# Patient Record
Sex: Female | Born: 1935 | Race: White | Hispanic: No | Smoking: Never smoker
Health system: Southern US, Community
[De-identification: ages and names within clinical notes are randomized; demographics above are authoritative.]

## PROBLEM LIST (undated history)

## (undated) DIAGNOSIS — I10 Essential (primary) hypertension: Secondary | ICD-10-CM

## (undated) DIAGNOSIS — M542 Cervicalgia: Secondary | ICD-10-CM

## (undated) DIAGNOSIS — G609 Hereditary and idiopathic neuropathy, unspecified: Secondary | ICD-10-CM

## (undated) DIAGNOSIS — S7000XA Contusion of unspecified hip, initial encounter: Secondary | ICD-10-CM

## (undated) DIAGNOSIS — G4700 Insomnia, unspecified: Secondary | ICD-10-CM

## (undated) DIAGNOSIS — S81809A Unspecified open wound, unspecified lower leg, initial encounter: Secondary | ICD-10-CM

## (undated) DIAGNOSIS — IMO0002 Reserved for concepts with insufficient information to code with codable children: Secondary | ICD-10-CM

## (undated) DIAGNOSIS — E039 Hypothyroidism, unspecified: Secondary | ICD-10-CM

## (undated) DIAGNOSIS — J189 Pneumonia, unspecified organism: Secondary | ICD-10-CM

## (undated) DIAGNOSIS — C44701 Unspecified malignant neoplasm of skin of unspecified lower limb, including hip: Secondary | ICD-10-CM

## (undated) DIAGNOSIS — M461 Sacroiliitis, not elsewhere classified: Secondary | ICD-10-CM

## (undated) DIAGNOSIS — K3189 Other diseases of stomach and duodenum: Secondary | ICD-10-CM

## (undated) DIAGNOSIS — J218 Acute bronchiolitis due to other specified organisms: Secondary | ICD-10-CM

## (undated) DIAGNOSIS — S91009A Unspecified open wound, unspecified ankle, initial encounter: Secondary | ICD-10-CM

## (undated) DIAGNOSIS — R1013 Epigastric pain: Secondary | ICD-10-CM

## (undated) DIAGNOSIS — K922 Gastrointestinal hemorrhage, unspecified: Secondary | ICD-10-CM

## (undated) DIAGNOSIS — E785 Hyperlipidemia, unspecified: Secondary | ICD-10-CM

## (undated) DIAGNOSIS — I471 Supraventricular tachycardia, unspecified: Secondary | ICD-10-CM

## (undated) DIAGNOSIS — S20212A Contusion of left front wall of thorax, initial encounter: Secondary | ICD-10-CM

## (undated) DIAGNOSIS — S72109A Unspecified trochanteric fracture of unspecified femur, initial encounter for closed fracture: Secondary | ICD-10-CM

## (undated) DIAGNOSIS — R209 Unspecified disturbances of skin sensation: Secondary | ICD-10-CM

## (undated) DIAGNOSIS — S81009A Unspecified open wound, unspecified knee, initial encounter: Secondary | ICD-10-CM

## (undated) DIAGNOSIS — K208 Other esophagitis without bleeding: Secondary | ICD-10-CM

## (undated) DIAGNOSIS — R002 Palpitations: Secondary | ICD-10-CM

## (undated) DIAGNOSIS — R748 Abnormal levels of other serum enzymes: Secondary | ICD-10-CM

## (undated) DIAGNOSIS — M8448XA Pathological fracture, other site, initial encounter for fracture: Secondary | ICD-10-CM

## (undated) DIAGNOSIS — M1990 Unspecified osteoarthritis, unspecified site: Secondary | ICD-10-CM

## (undated) DIAGNOSIS — M940 Chondrocostal junction syndrome [Tietze]: Secondary | ICD-10-CM

## (undated) DIAGNOSIS — M25519 Pain in unspecified shoulder: Secondary | ICD-10-CM

## (undated) DIAGNOSIS — M25569 Pain in unspecified knee: Secondary | ICD-10-CM

## (undated) DIAGNOSIS — M545 Low back pain, unspecified: Secondary | ICD-10-CM

## (undated) DIAGNOSIS — W010XXA Fall on same level from slipping, tripping and stumbling without subsequent striking against object, initial encounter: Secondary | ICD-10-CM

## (undated) DIAGNOSIS — R7989 Other specified abnormal findings of blood chemistry: Secondary | ICD-10-CM

## (undated) DIAGNOSIS — R609 Edema, unspecified: Secondary | ICD-10-CM

## (undated) DIAGNOSIS — I2510 Atherosclerotic heart disease of native coronary artery without angina pectoris: Secondary | ICD-10-CM

## (undated) DIAGNOSIS — S022XXA Fracture of nasal bones, initial encounter for closed fracture: Secondary | ICD-10-CM

## (undated) DIAGNOSIS — K219 Gastro-esophageal reflux disease without esophagitis: Secondary | ICD-10-CM

## (undated) DIAGNOSIS — C44711 Basal cell carcinoma of skin of unspecified lower limb, including hip: Secondary | ICD-10-CM

## (undated) DIAGNOSIS — H409 Unspecified glaucoma: Secondary | ICD-10-CM

## (undated) DIAGNOSIS — I4949 Other premature depolarization: Secondary | ICD-10-CM

## (undated) DIAGNOSIS — Z9181 History of falling: Secondary | ICD-10-CM

## (undated) DIAGNOSIS — M25579 Pain in unspecified ankle and joints of unspecified foot: Secondary | ICD-10-CM

## (undated) DIAGNOSIS — C44519 Basal cell carcinoma of skin of other part of trunk: Secondary | ICD-10-CM

## (undated) DIAGNOSIS — M6740 Ganglion, unspecified site: Secondary | ICD-10-CM

## (undated) HISTORY — DX: Abnormal levels of other serum enzymes: R74.8

## (undated) HISTORY — DX: Unspecified malignant neoplasm of skin of unspecified lower limb, including hip: C44.701

## (undated) HISTORY — DX: Basal cell carcinoma of skin of other part of trunk: C44.519

## (undated) HISTORY — DX: Contusion of left front wall of thorax, initial encounter: S20.212A

## (undated) HISTORY — DX: Ganglion, unspecified site: M67.40

## (undated) HISTORY — PX: JOINT REPLACEMENT: SHX530

## (undated) HISTORY — DX: Hypothyroidism, unspecified: E03.9

## (undated) HISTORY — DX: Pain in unspecified shoulder: M25.519

## (undated) HISTORY — DX: Insomnia, unspecified: G47.00

## (undated) HISTORY — DX: Basal cell carcinoma of skin of unspecified lower limb, including hip: C44.711

## (undated) HISTORY — DX: Unspecified glaucoma: H40.9

## (undated) HISTORY — DX: Pain in unspecified ankle and joints of unspecified foot: M25.579

## (undated) HISTORY — DX: Pathological fracture, other site, initial encounter for fracture: M84.48XA

## (undated) HISTORY — DX: Contusion of unspecified hip, initial encounter: S70.00XA

## (undated) HISTORY — DX: Reserved for concepts with insufficient information to code with codable children: IMO0002

## (undated) HISTORY — DX: Supraventricular tachycardia, unspecified: I47.10

## (undated) HISTORY — DX: Acute bronchiolitis due to other specified organisms: J21.8

## (undated) HISTORY — DX: Other specified abnormal findings of blood chemistry: R79.89

## (undated) HISTORY — DX: Fall on same level from slipping, tripping and stumbling without subsequent striking against object, initial encounter: W01.0XXA

## (undated) HISTORY — DX: Sacroiliitis, not elsewhere classified: M46.1

## (undated) HISTORY — DX: Low back pain: M54.5

## (undated) HISTORY — PX: EYE SURGERY: SHX253

## (undated) HISTORY — DX: Atherosclerotic heart disease of native coronary artery without angina pectoris: I25.10

## (undated) HISTORY — DX: Other esophagitis: K20.8

## (undated) HISTORY — DX: Chondrocostal junction syndrome (tietze): M94.0

## (undated) HISTORY — DX: Other premature depolarization: I49.49

## (undated) HISTORY — DX: Unspecified trochanteric fracture of unspecified femur, initial encounter for closed fracture: S72.109A

## (undated) HISTORY — DX: Edema, unspecified: R60.9

## (undated) HISTORY — DX: Unspecified open wound, unspecified lower leg, initial encounter: S81.809A

## (undated) HISTORY — DX: Palpitations: R00.2

## (undated) HISTORY — DX: Cervicalgia: M54.2

## (undated) HISTORY — DX: Low back pain, unspecified: M54.50

## (undated) HISTORY — DX: Supraventricular tachycardia: I47.1

## (undated) HISTORY — DX: Epigastric pain: R10.13

## (undated) HISTORY — DX: History of falling: Z91.81

## (undated) HISTORY — DX: Other diseases of stomach and duodenum: K31.89

## (undated) HISTORY — DX: Hereditary and idiopathic neuropathy, unspecified: G60.9

## (undated) HISTORY — DX: Fracture of nasal bones, initial encounter for closed fracture: S02.2XXA

## (undated) HISTORY — DX: Pneumonia, unspecified organism: J18.9

## (undated) HISTORY — DX: Gastrointestinal hemorrhage, unspecified: K92.2

## (undated) HISTORY — DX: Gastro-esophageal reflux disease without esophagitis: K21.9

## (undated) HISTORY — DX: Essential (primary) hypertension: I10

## (undated) HISTORY — DX: Unspecified open wound, unspecified ankle, initial encounter: S91.009A

## (undated) HISTORY — DX: Hyperlipidemia, unspecified: E78.5

## (undated) HISTORY — DX: Unspecified open wound, unspecified knee, initial encounter: S81.009A

## (undated) HISTORY — DX: Unspecified disturbances of skin sensation: R20.9

## (undated) HISTORY — DX: Unspecified osteoarthritis, unspecified site: M19.90

## (undated) HISTORY — DX: Other esophagitis without bleeding: K20.80

## (undated) HISTORY — DX: Pain in unspecified knee: M25.569

---

## 1993-12-30 HISTORY — PX: BREAST LUMPECTOMY: SHX2

## 1998-07-06 ENCOUNTER — Other Ambulatory Visit: Admission: RE | Admit: 1998-07-06 | Discharge: 1998-07-06 | Payer: Self-pay | Admitting: Dermatology

## 1998-10-11 ENCOUNTER — Encounter: Payer: Self-pay | Admitting: Orthopedic Surgery

## 1998-10-16 ENCOUNTER — Encounter: Payer: Self-pay | Admitting: Orthopedic Surgery

## 1998-10-16 ENCOUNTER — Inpatient Hospital Stay (HOSPITAL_COMMUNITY): Admission: RE | Admit: 1998-10-16 | Discharge: 1998-10-19 | Payer: Self-pay | Admitting: Orthopedic Surgery

## 1999-12-31 HISTORY — PX: TOTAL HIP ARTHROPLASTY: SHX124

## 2001-12-30 HISTORY — PX: CATARACT EXTRACTION W/ INTRAOCULAR LENS  IMPLANT, BILATERAL: SHX1307

## 2004-07-17 ENCOUNTER — Emergency Department (HOSPITAL_COMMUNITY): Admission: EM | Admit: 2004-07-17 | Discharge: 2004-07-17 | Payer: Self-pay | Admitting: Emergency Medicine

## 2006-01-11 ENCOUNTER — Emergency Department (HOSPITAL_COMMUNITY): Admission: EM | Admit: 2006-01-11 | Discharge: 2006-01-11 | Payer: Self-pay | Admitting: Emergency Medicine

## 2009-04-27 ENCOUNTER — Inpatient Hospital Stay (HOSPITAL_COMMUNITY): Admission: EM | Admit: 2009-04-27 | Discharge: 2009-04-29 | Payer: Self-pay | Admitting: Emergency Medicine

## 2009-04-28 ENCOUNTER — Encounter: Payer: Self-pay | Admitting: Cardiology

## 2009-04-29 ENCOUNTER — Ambulatory Visit: Admission: RE | Admit: 2009-04-29 | Discharge: 2009-04-29 | Payer: Self-pay | Admitting: Cardiology

## 2009-05-04 ENCOUNTER — Inpatient Hospital Stay (HOSPITAL_COMMUNITY): Admission: EM | Admit: 2009-05-04 | Discharge: 2009-05-05 | Payer: Self-pay | Admitting: Emergency Medicine

## 2009-05-30 ENCOUNTER — Ambulatory Visit: Payer: Self-pay | Admitting: Cardiology

## 2009-08-23 ENCOUNTER — Ambulatory Visit: Payer: Self-pay | Admitting: Cardiology

## 2009-08-30 ENCOUNTER — Encounter: Admission: RE | Admit: 2009-08-30 | Discharge: 2009-08-30 | Payer: Self-pay | Admitting: Internal Medicine

## 2009-12-01 ENCOUNTER — Encounter (INDEPENDENT_AMBULATORY_CARE_PROVIDER_SITE_OTHER): Payer: Self-pay | Admitting: *Deleted

## 2009-12-09 ENCOUNTER — Emergency Department (HOSPITAL_COMMUNITY): Admission: EM | Admit: 2009-12-09 | Discharge: 2009-12-09 | Payer: Self-pay | Admitting: Emergency Medicine

## 2009-12-15 ENCOUNTER — Ambulatory Visit: Payer: Self-pay | Admitting: Cardiology

## 2010-01-03 ENCOUNTER — Encounter: Admission: RE | Admit: 2010-01-03 | Discharge: 2010-01-03 | Payer: Self-pay | Admitting: Internal Medicine

## 2010-01-16 ENCOUNTER — Ambulatory Visit: Payer: Self-pay | Admitting: Cardiology

## 2010-02-04 ENCOUNTER — Emergency Department (HOSPITAL_COMMUNITY): Admission: EM | Admit: 2010-02-04 | Discharge: 2010-02-04 | Payer: Self-pay | Admitting: Emergency Medicine

## 2010-03-02 ENCOUNTER — Encounter: Admission: RE | Admit: 2010-03-02 | Discharge: 2010-03-02 | Payer: Self-pay | Admitting: Internal Medicine

## 2010-03-19 ENCOUNTER — Other Ambulatory Visit: Payer: Self-pay | Admitting: Emergency Medicine

## 2010-03-20 ENCOUNTER — Inpatient Hospital Stay (HOSPITAL_COMMUNITY): Admission: EM | Admit: 2010-03-20 | Discharge: 2010-03-23 | Payer: Self-pay | Admitting: Internal Medicine

## 2010-03-27 ENCOUNTER — Encounter (INDEPENDENT_AMBULATORY_CARE_PROVIDER_SITE_OTHER): Payer: Self-pay | Admitting: *Deleted

## 2010-07-16 DIAGNOSIS — I219 Acute myocardial infarction, unspecified: Secondary | ICD-10-CM | POA: Insufficient documentation

## 2010-07-16 DIAGNOSIS — E785 Hyperlipidemia, unspecified: Secondary | ICD-10-CM

## 2010-07-18 ENCOUNTER — Ambulatory Visit: Payer: Self-pay | Admitting: Cardiology

## 2010-07-18 DIAGNOSIS — I2510 Atherosclerotic heart disease of native coronary artery without angina pectoris: Secondary | ICD-10-CM

## 2010-08-22 HISTORY — PX: LEG SKIN LESION  BIOPSY / EXCISION: SUR473

## 2010-11-26 ENCOUNTER — Emergency Department (HOSPITAL_COMMUNITY): Admission: EM | Admit: 2010-11-26 | Discharge: 2010-11-26 | Payer: Self-pay | Admitting: Family Medicine

## 2010-11-27 ENCOUNTER — Encounter: Admission: RE | Admit: 2010-11-27 | Discharge: 2010-11-27 | Payer: Self-pay | Admitting: Internal Medicine

## 2010-11-29 ENCOUNTER — Ambulatory Visit: Payer: Self-pay | Admitting: Cardiology

## 2010-11-29 DIAGNOSIS — I471 Supraventricular tachycardia: Secondary | ICD-10-CM

## 2011-01-31 NOTE — Miscellaneous (Signed)
  Clinical Lists Changes  Observations: Added new observation of RS STUDY: TRACER - study completion 01/16/10 (03/27/2010 12:14)      Research Study Name: TRACER - study completion 01/16/10

## 2011-01-31 NOTE — Assessment & Plan Note (Signed)
Summary: 6 month SVT/CAD  pfh,rn   Visit Type:  Follow-up Primary Provider:  Dr. Carles Collet  CC:  SVT and CAD.  History of Present Illness: The patient returns for followup of the above issues. Since I last saw her she has had no new cardiovascular complaints. She does not report any palpitations, presyncope or syncope. He has had chest pressure, or discomfort. She denies any new shortness of breath, PND or orthopnea. She has had no weight gain or edema. She has been bothered recently by some left foot pain which is somewhat limited her.  Current Medications (verified): 1)  Zetia 10 Mg Tabs (Ezetimibe) .Marland Kitchen.. 1 By Mouth Daily 2)  Protonix 40 Mg Tbec (Pantoprazole Sodium) .Marland Kitchen.. 1 By Mouth Daily 3)  Metoprolol Tartrate 25 Mg Tabs (Metoprolol Tartrate) .... 1/2 By Mouth  Two Times A Day 4)  Multivitamins   Tabs (Multiple Vitamin) .Marland Kitchen.. 1 By Mouth Dialy 5)  Fish Oil   Oil (Fish Oil) .Marland Kitchen.. 1 By Mouth Daily 6)  Acetaminophen 325 Mg  Tabs (Acetaminophen) .... As Needed 7)  Isosorbide Mononitrate Cr 60 Mg Xr24h-Tab (Isosorbide Mononitrate) .Marland Kitchen.. 1 By Mouth Daily 8)  Lotrel 5-10 Mg Caps (Amlodipine Besy-Benazepril Hcl) .Marland Kitchen.. 1 Podialy 9)  Aspirin 81 Mg  Tabs (Aspirin) .Marland Kitchen.. 1 By Mouth Daily 10)  Alphagan P 0.15 % Soln (Brimonidine Tartrate) .... As Directed 11)  Xalatan 0.005 % Soln (Latanoprost) .... As Directed 12)  Lipitor 20 Mg Tabs (Atorvastatin Calcium) .... One Daily  Allergies (verified): No Known Drug Allergies  Past History:  Past Medical History: Reviewed history from 07/18/2010 and no changes required.  1.  SVT.   2. Hypertension.   3. Dyslipidemia.   4. CAD (Cath 03/2009. Distal LAD 100%, Circ distal 60%, OM2 30%)  5. GI bleeding  Past Surgical History: Reviewed history from 07/18/2010 and no changes required. Hip surgery  Review of Systems       As stated in the HPI and negative for all other systems.   Vital Signs:  Patient profile:   76 year old female Height:      66  inches Weight:      161 pounds BMI:     26.08 Pulse rate:   54 / minute Resp:     16 per minute BP sitting:   142 / 78  (right arm)  Vitals Entered By: Marrion Coy, CNA (November 29, 2010 12:14 PM)  Physical Exam  General:  Well developed, well nourished, in no acute distress. Head:  normocephalic and atraumatic Mouth:  Oral mucosa normal. Neck:  Neck supple, no JVD. No masses, thyromegaly or abnormal cervical nodes. Chest Wall:  no deformities or breast masses noted Lungs:  Clear bilaterally to auscultation and percussion. Abdomen:  Bowel sounds positive; abdomen soft and non-tender without masses, organomegaly, or hernias noted. No hepatosplenomegaly. Msk:  Back normal, normal gait. Muscle strength and tone normal. Extremities:  No clubbing or cyanosis. Neurologic:  Alert and oriented x 3. Skin:  Intact without lesions or rashes. Cervical Nodes:  no significant adenopathy Axillary Nodes:  no significant adenopathy Inguinal Nodes:  no significant adenopathy Psych:  Normal affect.   Detailed Cardiovascular Exam  Neck    Carotids: Carotids full and equal bilaterally without bruits.      Neck Veins: Normal, no JVD.    Heart    Inspection: no deformities or lifts noted.      Palpation: normal PMI with no thrills palpable.  Auscultation: regular rate and rhythm, S1, S2 without murmurs, rubs, gallops, or clicks.    Vascular    Abdominal Aorta: no palpable masses, pulsations, or audible bruits.      Femoral Pulses: normal femoral pulses bilaterally.      Pedal Pulses: normal pedal pulses bilaterally.      Radial Pulses: normal radial pulses bilaterally.      Peripheral Circulation: no clubbing, cyanosis, or edema noted with normal capillary refill.     EKG  Procedure date:  11/29/2010  Findings:      Sinus rhythm, rate 53, old anteroapical infarct, lateral T-wave inversions, right axis deviation.  Impression & Recommendations:  Problem # 1:  CORONARY  ATHEROSCLEROSIS NATIVE CORONARY ARTERY (ICD-414.01) She has no new symptoms related to this. No change in therapy is indicated. Orders: EKG w/ Interpretation (93000)  Problem # 2:  DYSLIPIDEMIA (ICD-272.4) Per her primary physician. With a goal LDL less than 100 and HDL greater than 40.  Problem # 3:  HYPERTENSION (ICD-401.9) Her blood pressure was controlled and she will continue the meds as listed. She had it checked today in church and her systolic was in the 120s.  Problem # 4:  PSVT (ICD-427.0) She is told if she develops these symptoms in the future she should present to the emergency room.  Patient Instructions: 1)  Your physician recommends that you schedule a follow-up appointment in: 1 yr with Dr Antoine Poche 2)  Your physician recommends that you continue on your current medications as directed. Please refer to the Current Medication list given to you today.

## 2011-01-31 NOTE — Assessment & Plan Note (Signed)
Summary: per son request Levora Angel)   Visit Type:  Initial Consult Primary Provider:  Dr. Carles Collet  CC:  SVT and CAD.  History of Present Illness: The patient presents as a new patient.  She had a history of an apparent SVT in April of 2010.  She ruled in for an NQWMI.  Cardiac cath demonstrated diseases as described below and she was managed medically.  She lives at assited living and with the ambulates to the dining hall.  She has no limitations with this.  She denies ongoing chest pain.  She denies neck or arm discomfort.  She has no SOB, PND or orthopnea.  She denies palpiations presyncope or syncope.  She has a history of GI bleeding but has had no recent evidence of this.  She is switching our cardiology care to our practice.  Current Medications (verified): 1)  Zetia 10 Mg Tabs (Ezetimibe) .Marland Kitchen.. 1 By Mouth Daily 2)  Protonix 40 Mg Tbec (Pantoprazole Sodium) .Marland Kitchen.. 1 By Mouth Daily 3)  Metoprolol Tartrate 25 Mg Tabs (Metoprolol Tartrate) .... 1/2 By Mouth  Two Times A Day 4)  Simvastatin 40 Mg Tabs (Simvastatin) .Marland Kitchen.. 1 By Mouth Daily 5)  Multivitamins   Tabs (Multiple Vitamin) .Marland Kitchen.. 1 By Mouth Dialy 6)  Fish Oil   Oil (Fish Oil) .Marland Kitchen.. 1 By Mouth Daily 7)  Acetaminophen 325 Mg  Tabs (Acetaminophen) .... As Needed 8)  Isosorbide Mononitrate Cr 60 Mg Xr24h-Tab (Isosorbide Mononitrate) .Marland Kitchen.. 1 By Mouth Daily 9)  Lotrel 5-10 Mg Caps (Amlodipine Besy-Benazepril Hcl) .Marland Kitchen.. 1 Podialy 10)  Aspirin 81 Mg  Tabs (Aspirin) .Marland Kitchen.. 1 By Mouth Daily 11)  Alphagan P 0.15 % Soln (Brimonidine Tartrate) .... As Directed 12)  Xalatan 0.005 % Soln (Latanoprost) .... As Directed  Allergies (verified): No Known Drug Allergies  Past History:  Past Medical History:  1.  SVT.   2. Hypertension.   3. Dyslipidemia.   4. CAD (Cath 03/2009. Distal LAD 100%, Circ distal 60%, OM2 30%)  5. GI bleeding  Past Surgical History: Hip surgery  Family History: She states there is no one who has stroke or MI in the   family.   Social History: Reviewed history from 07/16/2010 and no changes required.  The patient denies smoking cigarettes.  Drinks a scotch   or wine every other day.  Denies any drug abuse.   Review of Systems       Positive for joint paints, swelling, cough.  Otherwise negative for all other systems.  Vital Signs:  Patient profile:   77 year old female Height:      66 inches Weight:      154 pounds BMI:     24.95 Pulse rate:   62 / minute Resp:     16 per minute BP sitting:   128 / 60  (right arm)  Vitals Entered By: Marrion Coy, CNA (July 18, 2010 2:16 PM)  Physical Exam  General:  Well developed, well nourished, in no acute distress. Head:  normocephalic and atraumatic Eyes:  PERRLA/EOM intact; conjunctiva and lids normal. Mouth:  Oral mucosa normal. Neck:  Neck supple, no JVD. No masses, thyromegaly or abnormal cervical nodes. Chest Wall:  no deformities or breast masses noted Lungs:  Clear bilaterally to auscultation and percussion. Abdomen:  Bowel sounds positive; abdomen soft and non-tender without masses, organomegaly, or hernias noted. No hepatosplenomegaly. Msk:  Back normal, normal gait. Muscle strength and tone normal. Extremities:  No clubbing or cyanosis. Neurologic:  Alert and oriented x 3. Skin:  Intact without lesions or rashes. Cervical Nodes:  no significant adenopathy Axillary Nodes:  no significant adenopathy Inguinal Nodes:  no significant adenopathy Psych:  Normal affect.   Detailed Cardiovascular Exam  Neck    Carotids: Carotids full and equal bilaterally without bruits.      Neck Veins: Normal, no JVD.    Heart    Inspection: no deformities or lifts noted.      Palpation: normal PMI with no thrills palpable.      Auscultation: regular rate and rhythm, S1, S2 without murmurs, rubs, gallops, or clicks.    Vascular    Abdominal Aorta: no palpable masses, pulsations, or audible bruits.      Femoral Pulses: normal femoral pulses  bilaterally.      Pedal Pulses: normal pedal pulses bilaterally.      Radial Pulses: normal radial pulses bilaterally.      Peripheral Circulation: no clubbing, cyanosis, or edema noted with normal capillary refill.     Impression & Recommendations:  Problem # 1:  CORONARY ATHEROSCLEROSIS NATIVE CORONARY ARTERY (ICD-414.01) The patient is having no active symptoms.  No further testing is indicated at this time.  I will continue with risk reduction. Orders: EKG w/ Interpretation (93000)  Problem # 2:  DYSLIPIDEMIA (ICD-272.4) Per the FDA warning I will discontinue the simvistatin and change to lipitor as the patient is on amlodipine. She will get a lipid and liver in 8 weeks.  Problem # 3:  HYPERTENSION (ICD-401.9) Her blood pressure is controlled and she will continue the meds as listed.  Patient Instructions: 1)  Your physician recommends that you schedule a follow-up appointment in: 6 months with Dr  Lions 2)  Your physician has recommended you make the following change in your medication: Stop Simvastatin and start Lipitor 20 mg a day Prescriptions: LIPITOR 20 MG TABS (ATORVASTATIN CALCIUM) one daily  #30 x 11   Entered by:   Charolotte Capuchin, RN   Authorized by:   Rollene Rotunda, MD, [facility]   Signed by:   Charolotte Capuchin, RN on 07/18/2010   Method used:   Electronically to        [facility] [phone]* (retail)       [address]       [place], [place]  [address]       Ph: [phone] or [phone]       Fax: [phone]   RxID:   [id number]  I have reviewed and approved all prescriptions at the time of this visit. Rollene Rotunda, MD, [facility]  July 18, 2010 3:34 PM

## 2011-03-25 LAB — CBC
HCT: 22.6 % — ABNORMAL LOW (ref 36.0–46.0)
HCT: 25.6 % — ABNORMAL LOW (ref 36.0–46.0)
HCT: 25.8 % — ABNORMAL LOW (ref 36.0–46.0)
HCT: 31.3 % — ABNORMAL LOW (ref 36.0–46.0)
Hemoglobin: 10.1 g/dL — ABNORMAL LOW (ref 12.0–15.0)
Hemoglobin: 7.6 g/dL — ABNORMAL LOW (ref 12.0–15.0)
Hemoglobin: 8.6 g/dL — ABNORMAL LOW (ref 12.0–15.0)
Hemoglobin: 8.6 g/dL — ABNORMAL LOW (ref 12.0–15.0)
MCHC: 32.4 g/dL (ref 30.0–36.0)
MCHC: 33.1 g/dL (ref 30.0–36.0)
MCHC: 33.2 g/dL (ref 30.0–36.0)
MCHC: 33.6 g/dL (ref 30.0–36.0)
MCHC: 33.6 g/dL (ref 30.0–36.0)
MCV: 93.7 fL (ref 78.0–100.0)
MCV: 93.8 fL (ref 78.0–100.0)
MCV: 94.1 fL (ref 78.0–100.0)
Platelets: 240 10*3/uL (ref 150–400)
RBC: 2.73 MIL/uL — ABNORMAL LOW (ref 3.87–5.11)
RBC: 2.73 MIL/uL — ABNORMAL LOW (ref 3.87–5.11)
RBC: 2.77 MIL/uL — ABNORMAL LOW (ref 3.87–5.11)
RBC: 2.82 MIL/uL — ABNORMAL LOW (ref 3.87–5.11)
RBC: 3.34 MIL/uL — ABNORMAL LOW (ref 3.87–5.11)
RDW: 16.5 % — ABNORMAL HIGH (ref 11.5–15.5)
RDW: 17.4 % — ABNORMAL HIGH (ref 11.5–15.5)
WBC: 5.9 10*3/uL (ref 4.0–10.5)
WBC: 8.4 10*3/uL (ref 4.0–10.5)
WBC: 8.8 10*3/uL (ref 4.0–10.5)

## 2011-03-25 LAB — GLUCOSE, CAPILLARY
Glucose-Capillary: 100 mg/dL — ABNORMAL HIGH (ref 70–99)
Glucose-Capillary: 124 mg/dL — ABNORMAL HIGH (ref 70–99)
Glucose-Capillary: 125 mg/dL — ABNORMAL HIGH (ref 70–99)
Glucose-Capillary: 126 mg/dL — ABNORMAL HIGH (ref 70–99)
Glucose-Capillary: 147 mg/dL — ABNORMAL HIGH (ref 70–99)
Glucose-Capillary: 152 mg/dL — ABNORMAL HIGH (ref 70–99)
Glucose-Capillary: 153 mg/dL — ABNORMAL HIGH (ref 70–99)
Glucose-Capillary: 58 mg/dL — ABNORMAL LOW (ref 70–99)
Glucose-Capillary: 84 mg/dL (ref 70–99)
Glucose-Capillary: 87 mg/dL (ref 70–99)

## 2011-03-25 LAB — DIFFERENTIAL
Basophils Absolute: 0 10*3/uL (ref 0.0–0.1)
Basophils Absolute: 0 10*3/uL (ref 0.0–0.1)
Basophils Relative: 0 % (ref 0–1)
Eosinophils Absolute: 0 K/uL (ref 0.0–0.7)
Eosinophils Relative: 0 % (ref 0–5)
Eosinophils Relative: 3 % (ref 0–5)
Lymphocytes Relative: 14 % (ref 12–46)
Lymphocytes Relative: 31 % (ref 12–46)
Lymphs Abs: 1.2 K/uL (ref 0.7–4.0)
Monocytes Absolute: 0.3 10*3/uL (ref 0.1–1.0)
Monocytes Absolute: 0.6 10*3/uL (ref 0.1–1.0)
Monocytes Relative: 4 % (ref 3–12)
Neutro Abs: 6.9 K/uL (ref 1.7–7.7)
Neutrophils Relative %: 82 % — ABNORMAL HIGH (ref 43–77)

## 2011-03-25 LAB — SAMPLE TO BLOOD BANK

## 2011-03-25 LAB — CLOTEST (H. PYLORI), BIOPSY: Helicobacter screen: NEGATIVE

## 2011-03-25 LAB — CROSSMATCH
ABO/RH(D): O POS
Antibody Screen: NEGATIVE

## 2011-03-25 LAB — HEMOGLOBIN AND HEMATOCRIT, BLOOD
HCT: 34.3 % — ABNORMAL LOW (ref 36.0–46.0)
HCT: 35 % — ABNORMAL LOW (ref 36.0–46.0)
Hemoglobin: 10.9 g/dL — ABNORMAL LOW (ref 12.0–15.0)
Hemoglobin: 11.4 g/dL — ABNORMAL LOW (ref 12.0–15.0)
Hemoglobin: 11.7 g/dL — ABNORMAL LOW (ref 12.0–15.0)
Hemoglobin: 12 g/dL (ref 12.0–15.0)

## 2011-03-25 LAB — COMPREHENSIVE METABOLIC PANEL
AST: 17 U/L (ref 0–37)
AST: 18 U/L (ref 0–37)
Albumin: 2.9 g/dL — ABNORMAL LOW (ref 3.5–5.2)
Alkaline Phosphatase: 75 U/L (ref 39–117)
BUN: 42 mg/dL — ABNORMAL HIGH (ref 6–23)
Chloride: 110 mEq/L (ref 96–112)
Creatinine, Ser: 0.7 mg/dL (ref 0.4–1.2)
GFR calc Af Amer: >60 mL/min (ref >60)
GFR calc Af Amer: >60 mL/min (ref >60)
GFR calc non Af Amer: >60 mL/min (ref >60)
Glucose, Bld: 77 mg/dL (ref 70–99)
Potassium: 3.5 mEq/L (ref 3.5–5.1)
Potassium: 4.4 mEq/L (ref 3.5–5.1)
Sodium: 140 mEq/L (ref 135–145)
Sodium: 143 mEq/L (ref 135–145)
Total Bilirubin: 0.5 mg/dL (ref 0.3–1.2)
Total Protein: 6.1 g/dL (ref 6.0–8.3)

## 2011-03-25 LAB — CK TOTAL AND CKMB (NOT AT ARMC)
CK, MB: 4.1 ng/mL — ABNORMAL HIGH (ref 0.3–4.0)
Relative Index: INVALID (ref 0.0–2.5)
Total CK: 56 U/L (ref 7–177)

## 2011-03-25 LAB — POCT CARDIAC MARKERS
CKMB, poc: 1.5 ng/mL (ref 1.0–8.0)
Myoglobin, poc: 63.3 ng/mL (ref 12–200)
Troponin i, poc: <0.05 ng/mL (ref 0.00–0.09)

## 2011-03-25 LAB — COMPREHENSIVE METABOLIC PANEL WITH GFR
ALT: 17 U/L (ref 0–35)
BUN: 66 mg/dL — ABNORMAL HIGH (ref 6–23)
CO2: 27 meq/L (ref 19–32)
Calcium: 8.8 mg/dL (ref 8.4–10.5)
Creatinine, Ser: 0.72 mg/dL (ref 0.4–1.2)
GFR calc non Af Amer: >60 mL/min (ref >60)
Glucose, Bld: 125 mg/dL — ABNORMAL HIGH (ref 70–99)

## 2011-03-25 LAB — ABO/RH: ABO/RH(D): O POS

## 2011-03-25 LAB — PROTIME-INR
INR: 1.05 (ref 0.00–1.49)
Prothrombin Time: 13.2 seconds (ref 11.6–15.2)
Prothrombin Time: 13.6 s (ref 11.6–15.2)

## 2011-03-25 LAB — BASIC METABOLIC PANEL
BUN: 4 mg/dL — ABNORMAL LOW (ref 6–23)
Calcium: 8.5 mg/dL (ref 8.4–10.5)
Creatinine, Ser: 0.67 mg/dL (ref 0.4–1.2)
GFR calc non Af Amer: >60 mL/min (ref >60)
Potassium: 3 mEq/L — ABNORMAL LOW (ref 3.5–5.1)

## 2011-03-25 LAB — HEMOCCULT GUIAC POC 1CARD (OFFICE): Fecal Occult Bld: POSITIVE

## 2011-03-25 LAB — TROPONIN I
Troponin I: 0.03 ng/mL (ref 0.00–0.06)
Troponin I: 0.03 ng/mL (ref 0.00–0.06)

## 2011-03-25 LAB — APTT: aPTT: 28 seconds (ref 24–37)

## 2011-03-25 LAB — TSH: TSH: 1.699 u[IU]/mL (ref 0.350–4.500)

## 2011-04-09 LAB — CARDIAC PANEL(CRET KIN+CKTOT+MB+TROPI)
Relative Index: INVALID (ref 0.0–2.5)
Total CK: 106 U/L (ref 7–177)
Total CK: 80 U/L (ref 7–177)
Troponin I: 0.06 ng/mL (ref 0.00–0.06)
Troponin I: 0.08 ng/mL — ABNORMAL HIGH (ref 0.00–0.06)
Troponin I: 2.66 ng/mL (ref 0.00–0.06)

## 2011-04-09 LAB — CBC
HCT: 37.3 % (ref 36.0–46.0)
MCHC: 34 g/dL (ref 30.0–36.0)
MCHC: 34.6 g/dL (ref 30.0–36.0)
MCV: 98.2 fL (ref 78.0–100.0)
MCV: 98.6 fL (ref 78.0–100.0)
Platelets: 156 10*3/uL (ref 150–400)
Platelets: 210 10*3/uL (ref 150–400)
RBC: 3.91 MIL/uL (ref 3.87–5.11)
RDW: 12.7 % (ref 11.5–15.5)
RDW: 13.2 % (ref 11.5–15.5)
WBC: 9.8 10*3/uL (ref 4.0–10.5)

## 2011-04-09 LAB — DIFFERENTIAL
Basophils Relative: 1 % (ref 0–1)
Eosinophils Absolute: 0.3 10*3/uL (ref 0.0–0.7)
Eosinophils Relative: 3 % (ref 0–5)
Lymphs Abs: 2.5 10*3/uL (ref 0.7–4.0)
Neutrophils Relative %: 64 % (ref 43–77)

## 2011-04-09 LAB — BASIC METABOLIC PANEL
BUN: 11 mg/dL (ref 6–23)
BUN: 20 mg/dL (ref 6–23)
CO2: 26 mEq/L (ref 19–32)
CO2: 30 mEq/L (ref 19–32)
Calcium: 8.5 mg/dL (ref 8.4–10.5)
Chloride: 105 mEq/L (ref 96–112)
Chloride: 109 mEq/L (ref 96–112)
Creatinine, Ser: 0.78 mg/dL (ref 0.4–1.2)
Creatinine, Ser: 1.08 mg/dL (ref 0.4–1.2)
GFR calc Af Amer: >60 mL/min (ref >60)
Glucose, Bld: 84 mg/dL (ref 70–99)
Potassium: 4.5 mEq/L (ref 3.5–5.1)

## 2011-04-09 LAB — POCT CARDIAC MARKERS: Myoglobin, poc: 87.5 ng/mL (ref 12–200)

## 2011-04-09 LAB — PROTIME-INR: Prothrombin Time: 13.3 seconds (ref 11.6–15.2)

## 2011-04-10 LAB — POCT CARDIAC MARKERS: Myoglobin, poc: 355 ng/mL (ref 12–200)

## 2011-04-10 LAB — DIFFERENTIAL
Basophils Absolute: 0 10*3/uL (ref 0.0–0.1)
Lymphocytes Relative: 15 % (ref 12–46)
Lymphs Abs: 1.3 10*3/uL (ref 0.7–4.0)
Monocytes Absolute: 0.9 10*3/uL (ref 0.1–1.0)
Monocytes Relative: 10 % (ref 3–12)
Neutro Abs: 6.1 10*3/uL (ref 1.7–7.7)

## 2011-04-10 LAB — LIPID PANEL
Cholesterol: 132 mg/dL (ref 0–200)
LDL Cholesterol: 91 mg/dL (ref 0–99)
VLDL: 23 mg/dL (ref 0–40)

## 2011-04-10 LAB — CK TOTAL AND CKMB (NOT AT ARMC)
CK, MB: 24.8 ng/mL — ABNORMAL HIGH (ref 0.3–4.0)
Relative Index: 18.8 — ABNORMAL HIGH (ref 0.0–2.5)
Total CK: 132 U/L (ref 7–177)

## 2011-04-10 LAB — CBC
HCT: 39.5 % (ref 36.0–46.0)
HCT: 41.6 % (ref 36.0–46.0)
Hemoglobin: 13.7 g/dL (ref 12.0–15.0)
MCHC: 34.5 g/dL (ref 30.0–36.0)
MCV: 98.4 fL (ref 78.0–100.0)
Platelets: 182 10*3/uL (ref 150–400)
RDW: 12.9 % (ref 11.5–15.5)
RDW: 13.1 % (ref 11.5–15.5)

## 2011-04-10 LAB — URINALYSIS, ROUTINE W REFLEX MICROSCOPIC
Glucose, UA: NEGATIVE mg/dL
Hgb urine dipstick: NEGATIVE
pH: 6 (ref 5.0–8.0)

## 2011-04-10 LAB — COMPREHENSIVE METABOLIC PANEL
Albumin: 3.4 g/dL — ABNORMAL LOW (ref 3.5–5.2)
BUN: 17 mg/dL (ref 6–23)
Calcium: 9.1 mg/dL (ref 8.4–10.5)
Creatinine, Ser: 1.09 mg/dL (ref 0.4–1.2)
Total Bilirubin: 0.6 mg/dL (ref 0.3–1.2)
Total Protein: 6.8 g/dL (ref 6.0–8.3)

## 2011-04-10 LAB — URINE MICROSCOPIC-ADD ON

## 2011-04-10 LAB — CARDIAC PANEL(CRET KIN+CKTOT+MB+TROPI)
CK, MB: 16.8 ng/mL — ABNORMAL HIGH (ref 0.3–4.0)
CK, MB: 27.5 ng/mL — ABNORMAL HIGH (ref 0.3–4.0)
Relative Index: 16.2 — ABNORMAL HIGH (ref 0.0–2.5)
Total CK: 146 U/L (ref 7–177)

## 2011-04-10 LAB — APTT: aPTT: 28 seconds (ref 24–37)

## 2011-04-10 LAB — TROPONIN I: Troponin I: 1.32 ng/mL (ref 0.00–0.06)

## 2011-04-10 LAB — HEMOGLOBIN A1C: Hgb A1c MFr Bld: 5.6 % (ref 4.6–6.1)

## 2011-04-18 ENCOUNTER — Other Ambulatory Visit: Payer: Self-pay | Admitting: *Deleted

## 2011-04-18 MED ORDER — METOPROLOL TARTRATE 25 MG PO TABS: ORAL_TABLET | ORAL | Status: DC

## 2011-05-14 NOTE — H&P (Signed)
NAMECHRISTY, [name]                ACCOUNT NO.:  [id number]   MEDICAL RECORD NO.:  [id number]          PATIENT TYPE:  OBV   LOCATION:  2029                         FACILITY:  [facility]   PHYSICIAN:  Jake Bathe, MD      DATE OF BIRTH:  Jan 30, 1920   DATE OF ADMISSION:  05/04/2009  DATE OF DISCHARGE:  05/05/2009                              HISTORY & PHYSICAL   CARDIOLOGIST:  Armanda Magic, MD   CHIEF COMPLAINT:  Chest pain.   HISTORY OF PRESENT ILLNESS:  An 77 year old female who was recently  admitted with non-ST elevation myocardial infarction on April 28, 2009,  who underwent cardiac catheterization which revealed an occluded LAD  with right-to-left collaterals, which could not be crossed with a wire,  who had supraventricular tachycardia previous to that, question demand  ischemia, currently here with reports of chest pain earlier today.  She  lives at [facility] and earlier today off and on throughout the day,  she had chest discomfort substernal, lasting a few minutes in duration.  She tried Pepcid, which gave her no relief.  When she was given  nitroglycerin supposedly, her chest pain was relieved.  The [facility]  nurse then sent her over to [facility] Emergency Department for further  evaluation.  Here currently she is feeling well without any chest  discomfort.  She is ready to eat.  [place]   PAST MEDICAL HISTORY:  1. Recent non-ST elevation myocardial infarction as above.  2. Coronary artery disease.  3. Hypertension.  4. GERD.  5. Hyperlipidemia.   ALLERGIES:  No known drug allergies.   MEDICATIONS:  1. Zetia 10 mg once a day.  2. Simvastatin 40 mg a day.  3. Amlodipine/benazepril 5/10 mg a day.  4. Xalatan eye drops.  5. Plavix 75 mg a day.  6. Imdur 30 mg a day.  7. Aspirin 325 mg a day.  8. Metoprolol tartrate 12.5 mg twice a day.  9. She is also on a study drug [id number].   SOCIAL HISTORY:  No tobacco.  No alcohol use.  Lives  at [facility].   FAMILY HISTORY:  Currently noncontributory.   REVIEW OF SYSTEMS:  Denies any fevers, cough, chills, orthopnea,  shortness of breath, bleeding.  She does note ecchymosis at  catheterization site.  No leg pain.  Unless stated above, all other 12  review of systems negative.   PHYSICAL EXAMINATION:  VITAL SIGNS:  Blood pressure 122/73, temperature  97.6, pulse 51-55, respirations 17, sating 99% on room air.  GENERAL:  Alert and oriented x3 in no acute distress, here with her  family at bedside including elderly husband, son, daughter, and daughter-  in-law.  EYES:  Well-perfused conjunctivae.  EOMI.  No scleral icterus.  NECK:  Supple.  No lymphadenopathy.  No carotid bruits.  No JVD.  CARDIOVASCULAR:  Regular rate and rhythm with a 2/6 systolic murmur  heard at left upper sternal border.  Normal PMI.  LUNGS:  Clear to auscultation bilaterally.  No wheezes.  No rales.  ABDOMEN:  Soft, nontender, normoactive bowel sounds.  Mildly  protuberant.  No hepatosplenomegaly appreciated.  EXTREMITIES:  No clubbing, cyanosis, or edema.  Excellent distal pulses  2+.  SKIN:  Ecchymosis noted at cath site traversing along in her groin and  inner thigh.  No appreciable hematoma.  No appreciable bruits.  NEUROLOGIC:  Nonfocal.  No tremors.  PSYCH:  Normal affect.   LABORATORY DATA:  EKG shows left bundle-branch block pattern chronic, no  changes.  Chest x-ray showed cardiomegaly with no pulmonary edema.  No  active lung disease.  Chest x-ray was personally reviewed.  White count  9.8, hemoglobin 12, hematocrit 37, platelets 210, INR 1.0.  Point of  care markers are negative x1.  BNP is mildly elevated at 167.  Sodium  139, potassium 4.5, BUN 20, creatinine 1.0.  Prior cardiac biomarkers on  Apr 29, 2009, demonstrated a CK-MB of 17.6, troponin of 2.6.  Prior TSH  was normal at 4.3.  Hemoglobin A1c was 5.6 normal.  Prior BNP was 208,  which is higher than current value.  Total  cholesterol was 132, HDL 18,  LDL 91, triglycerides 116.   ASSESSMENT AND PLAN:  An 77 year old female with recent non-ST elevation  myocardial infarction with occluded left anterior descending with right-  to-left collaterals here with recurrent bouts of chest pain waxing and  waning currently chest pain free.  1. Chest pain - possible acute coronary syndrome.  First set of      cardiac biomarkers are reassuring, especially given the fact that      her myocardial infarction was last week and on May 1 her cardiac      biomarkers were elevated as above.  My current strategy will be to      continue her beta-blocker, oxygen, nitroglycerin p.r.n., statin      therapy, as well as Zetia.  I will also increase her Imdur or long-      acting nitrate to 60 mg from 30 mg.  Cardiac diet.  Ejection      fraction was normal at catheterization.  She is eager to go home.      However, I asked her to stay overnight to ensure that there is no      significant elevation in cardiac biomarkers.  Unfortunately, during      previous cardiac catheterization, the wire was unable to cross her      left anterior descending occlusion.  However, she does have right-      to-left collaterals present.  Will continue with aggressive medical      management.  She is also on a study drug as noted in her      medications, which was taken at the time of her non-ST elevation      myocardial infarction.  I will not place her on heparin acute      coronary syndrome dose currently.  I will give her heparin DVT      prophylaxis dose.  If cardiac biomarkers are markedly elevated, I      will initiate heparin and notify study      coordinator.  2. Hypertension - continue current regimen, well controlled.  We will      notify Dr. Mayford Knife.  Hopeful observation overnight.      Jake Bathe, MD  Electronically Signed     MCS/MEDQ  D:  05/04/2009  T:  05/05/2009  Job:  [id number]   cc:   Armanda Magic, M.D.

## 2011-05-14 NOTE — Cardiovascular Report (Signed)
NAMECORRETTA, MUNCE                ACCOUNT NO.:  [id number]   MEDICAL RECORD NO.:  [id number]          PATIENT TYPE:  INP   LOCATION:  2924                         FACILITY:  [facility]   PHYSICIAN:  Armanda Magic, M.D.     DATE OF BIRTH:  April 18, 1920   DATE OF PROCEDURE:  04/28/2009  DATE OF DISCHARGE:                            CARDIAC CATHETERIZATION   REFERRING PHYSICIAN:  Markham Jordan L. Effie Shy, MD   PROCEDURE:  Left heart catheterization, coronary angiography, and left  ventriculography.   OPERATOR:  Armanda Magic, MD   INDICATIONS:  Non-ST-elevation MI.   COMPLICATIONS:  None.   IV ACCESS:  Via right femoral artery 5-French sheath.   IV MEDICATIONS:  Versed 1 mg and fentanyl 25 mcg.   This is an 77 year old female who presented with substernal chest pain  and SVT and ruled in for myocardial infarction.  She now presents for  cardiac catheterization.   The patient was brought to cardiac catheterization laboratory in a  fasting, nonsedated state.  Informed consent was obtained.  The patient  was connected to continuous heart rate, pulse oximetry monitoring, and  intermittent blood pressure monitoring.  The right groin was prepped and  draped in a sterile fashion.  Xylocaine 1% was used for local  anesthesia.  Using a modified Seldinger technique, a 5-French sheath was  placed in the right femoral artery.  Under fluoroscopic guidance, a 5-  [place] JL-4 catheter was placed in left coronary artery.  Multiple cine  films were taken at 30-degree RAO and 40-degree LAO views.  This  catheter was exchanged out over a guidewire for a 5-French JR-4 catheter  which was successfully engaged in the right coronary ostium.  Multiple  cine films taken at 30-degree RAO and 40-degree LAO views.  This  catheter was exchanged out over a guidewire for a 5-French angled  pigtail catheter which was placed under fluoroscopic guidance in the  left ventricular cavity.  Left ventriculography was performed in  the 30-  degree RAO view using total of 25 mL of contrast at 12 mL per second.  Catheter was then pulled back across the aortic valve with no  significant gradient noted.  At the end of procedure, the catheter was  removed and the patient underwent attempted PCI of the LAD by Dr.  Eldridge Dace.   RESULTS:  The left main coronary artery is widely patent and bifurcates  into the left anterior descending artery and left circumflex artery.   Left anterior descending artery is patent proximally and gives rise to a  very large first diagonal.  The first diagonal is widely patent and  bifurcates into 2 daughter branches, both of which are widely patent.  Just distal to the takeoff of the diagonal, there is a tapered occlusion  of the LAD.  There is evidence of distal LAD filling via right-to-left  collaterals from the distal RCA.   The left circumflex artery traverses the AV groove and is diffusely  diseased up to 30% with luminal irregularities.  It gives rise to a  first obtuse marginal which is very small and second  obtuse marginal is  moderate in size and widely patent.  There was a third obtuse marginal  and then it has a 60-70% ostial stenosis and just distal to the takeoff  of third obtuse marginal, there is also a 60% narrowing of the distal  left circumflex.   The right coronary artery is extremely large and bifurcates distally in  a posterior descending artery and posterolateral artery, both of which  are widely patent.  There is evidence of right-to-left collateral flow  to the LAD.   Left ventriculography shows normal LV function, EF 60%, LV pressure  135/7 mmHg, aortic pressure 138/58 mmHg, and LVEDP 12 mmHg.   ASSESSMENT:  1. One-vessel obstructive coronary artery disease of the left anterior      descending with borderline obstructive disease of the left      circumflex obtuse marginal system.  2. Normal left ventricular function.  3. Status post non-ST-elevation myocardial  infarction.   PLAN:  PCI of the LAD per Dr. Eldridge Dace.  Medical management of the left  circumflex and OM.      Armanda Magic, M.D.  Electronically Signed     TT/MEDQ  D:  04/28/2009  T:  04/29/2009  Job:  [id number]

## 2011-05-14 NOTE — Discharge Summary (Signed)
[name], [name]                ACCOUNT NO.:  [id number]   MEDICAL RECORD NO.:  [id number]          PATIENT TYPE:  OBV   LOCATION:  2029                         FACILITY:  [facility]   PHYSICIAN:  Jake Bathe, MD      DATE OF BIRTH:  [birth year]   DATE OF ADMISSION:  05/04/2009  DATE OF DISCHARGE:  05/05/2009                               DISCHARGE SUMMARY   CARDIOLOGIST:  Armanda Magic, MD   FINAL DIAGNOSES:  1. Chest pain.  2. Coronary artery disease.  3. Hypertension.  4. Hyperlipidemia.   PROCEDURES:  None.   BRIEF HOSPITAL COURSE:  An 77 year old female who was here last week  with non-ST elevation myocardial infarction after period of  supraventricular tachycardia with elevated cardiac biomarkers (troponin  2), who returned to the hospital with chest pain.  See history and  physical for full details.  She had stuttering pain off and on over 30  minutes at her living facility.  She was transferred to [facility] for  further evaluation.  She had 3 sets of cardiac biomarkers all of which  are reassuring.  She is currently chest pain free and ambulating well  without any difficulty.  The only change made is an increase in her  isosorbide mononitrate from 30 mg to 60 mg once a day.  I could not  increase her beta-blocker of metoprolol 12.5 mg twice a day secondary to  bradycardia.  Heart rate has been in the 50s.   Overnight on telemetry, she had few PVCs, otherwise no adverse  arrhythmias.  Her chest x-ray demonstrated no active disease.  Her EKG  shows a left bundle-branch block which is chronic.   PHYSICAL EXAMINATION:  GENERAL:  Alert and oriented x3 in no acute  distress.  CARDIOVASCULAR:  Regular rate and rhythm with a 2/6 systolic murmur  heard at left upper sternal border.  LUNGS:  Clear to auscultation bilaterally.  ABDOMEN:  Soft and nontender.  Normoactive bowel sounds.  EXTREMITIES:  There is noted ecchymosis at catheterization site which  extravasates down  through groin and upper thigh, nontender.  No  hematoma, no bruits, good distal pulses, ambulating well.   LABORATORY DATA:  Sodium 139, potassium 4.5, BUN 20, creatinine 1.0, and  glucose 84.  Cardiac biomarkers this morning CK 44, MB 3.3.  Troponin  0.06.  Her weight on discharge was 70.6 kg.   DISCHARGE MEDICATIONS:  1. Aspirin 325 mg once a day.  2. Plavix 75 mg once a day.  3. Simvastatin 40 mg once a day.  4. Zetia 10 mg once a day.  5. Amlodipine/benazepril 5/10 mg once a day.  6. Metoprolol tartrate 12.5 mg once in the morning, once in the      evening.  7. Imdur increase to 60 mg once a day.  8. Study drug which she was given prior to her cardiac catheterization      secondary to myocardial infarction.  9. Nitroglycerin 0.4 mg sublingual p.r.n.   Her cardiac catheterization during the setting of myocardial infarction  revealed an occluded LAD with right-to-left collateral  flow.  Dr.  Lance Muss attempted to cross the occlusion with a wire, however,  this was unsuccessful likely due to chronicity of lesion.  Her elevated  cardiac biomarkers were felt to be partially secondary to her episode of  supraventricular tachycardia.  She demonstrated none of this during this  hospitalization or observation admission.   FOLLOWUP:  Dr. Armanda Magic, Wednesday May 17, 2009, at 10:15 a.m.  She  needs to contact us if any worsening symptoms occur.      Jake Bathe, MD  Electronically Signed     MCS/MEDQ  D:  05/05/2009  T:  05/05/2009  Job:  [id number]

## 2011-05-14 NOTE — Cardiovascular Report (Signed)
NAMEINEZ, STANTZ NO.:  [id number]   MEDICAL RECORD NO.:  [id number]          PATIENT TYPE:  INP   LOCATION:  2924                         FACILITY:  [facility]   PHYSICIAN:  Corky Crafts, MDDATE OF BIRTH:  03/04/1920   DATE OF PROCEDURE:  04/28/2009  DATE OF DISCHARGE:                            CARDIAC CATHETERIZATION   REFERRING PHYSICIAN:  Armanda Magic, MD   PRIMARY CARE PHYSICIAN:  Lenon Curt. Green, MD   PROCEDURES PERFORMED:  Attempted percutaneous coronary intervention of  the left anterior descending.   OPERATOR:  Corky Crafts, MD   INDICATIONS:  Non-ST-elevation MI.   PROCEDURE NARRATIVE:  Dr. Mayford Knife performed the diagnostic, showing an  occluded LAD.  It was unclear whether this was an acute lesion or a  chronic lesion.  Heparin was used for anticoagulation.  Multiple guide  catheters were used and finally a JL-3.5 guide was used to engage the  left main.  A [employer] wire and BMW were both attempted to cross the  tight LAD stenosis without success.  Finally, a [employer] 3 gram  wire was placed into a 2.0 x 15 over-the-wire balloon.  Despite multiple  attempts, this wire would not cross the lesion either.  Procedure was  stopped.  There were no apparent complications.   IMPRESSION:  1. 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  2. Right-to-left collaterals.  3. Normal left ventricular function.  4. Likely demand ischemia from increased heart rate.   RECOMMENDATIONS:  We will watch the patient overnight.  She will need  aggressive medical therapy for her SVT.  Assuming no complications, the  patient may be able to go home tomorrow.      Corky Crafts, MD  Electronically Signed     JSV/MEDQ  D:  04/28/2009  T:  04/29/2009  Job:  [id number]

## 2011-05-17 NOTE — Discharge Summary (Signed)
[name], [name]                ACCOUNT NO.:  [id number]   MEDICAL RECORD NO.:  [id number]          PATIENT TYPE:  INP   LOCATION:  2924                         FACILITY:  [facility]   PHYSICIAN:  Armanda Magic, M.D.     DATE OF BIRTH:  1920/10/29   DATE OF ADMISSION:  04/27/2009  DATE OF DISCHARGE:  04/29/2009                               DISCHARGE SUMMARY   DISCHARGE DIAGNOSES:  1. Non-ST segment elevated myocardial infarction, status post      unsuccessful percutaneous coronary intervention of the left      anterior descending, right to left collateral, medical management.  2. Supraventricular tachycardia, no recurrence on Lopressor.  3. Hypertension.  4. Dyslipidemia.  5. Gastroesophageal reflux disease.   [name] is a 77 year old female with a history of cardiac disease  who presented to the ER with substernal chest pain for several hours.  Her first EKG showed SVT with left bundle with ST segment depression in  a V4 and V6.  Second EKG showed normal sinus rhythm with a left bundle  branch block occasional PVC.  Her troponins were elevated to 1.32 and  she was diagnosed with a non-ST segment elevated myocardial infarction.  She became pain free in the emergency room and therefore, she was not  taken directly to the cath lab.  She was taken to the cath lab the  following day was found to have an occluded mid LAD lesion.  There were  some diffuse borderline obstructive coronary artery disease with a  circumflex and OM, the right was normal.  She had right to left  collaterals.  EF was normal.  Dr.  Eldridge Dace did not came into the lab  and attempted to cross the LAD lesion but was unsuccessful.  He felt  that she most likely had demand ischemia from an increased heart rate.  Therefore treated her medically for her SVT.   She remained in the hospital overnight and with the addition of beta  blocker and did not have recurrent SVT.  She was discharged home in  stable, but  improved condition.   DISCHARGE MEDICATIONS:  1. Zetia 10 mg a day.  2. Xalatan eye drops as before.  3. Fish oil daily.  4. Norvasc/benazepril 5/20 mg 1 p.o. daily.  5. Potassium daily.  6. Enteric-coated aspirin 325 mg a day.  7. Zocor 40 mg a day.  8. Plavix 75 mg a day.  9. Lopressor 12.5 mg twice a day.  10.Imdur 30 mg a day.  11.Tracer study drug.  12.Stop taking Neo-Synephrine.   Clean cath site gently with soap and water, no scrubbing.  She is to  remain on low-sodium heart-healthy diet.  Increase activity slowly.  No  lifting over 10 pounds for 1 week.  No driving for 2 days.  Follow up  with Dr. Mayford Knife on May 15, 2009, at 1:30 p.m.      Guy Franco, P.A.      Armanda Magic, M.D.  Electronically Signed    LB/MEDQ  D:  06/27/2009  T:  06/28/2009  Job:  [id number]

## 2011-07-04 ENCOUNTER — Encounter: Payer: Self-pay | Admitting: Cardiology

## 2011-09-23 ENCOUNTER — Ambulatory Visit (INDEPENDENT_AMBULATORY_CARE_PROVIDER_SITE_OTHER): Payer: Medicare Other | Admitting: Cardiology

## 2011-09-23 ENCOUNTER — Encounter: Payer: Self-pay | Admitting: Cardiology

## 2011-09-23 DIAGNOSIS — E785 Hyperlipidemia, unspecified: Secondary | ICD-10-CM

## 2011-09-23 DIAGNOSIS — I219 Acute myocardial infarction, unspecified: Secondary | ICD-10-CM

## 2011-09-23 DIAGNOSIS — I471 Supraventricular tachycardia, unspecified: Secondary | ICD-10-CM

## 2011-09-23 DIAGNOSIS — I2510 Atherosclerotic heart disease of native coronary artery without angina pectoris: Secondary | ICD-10-CM

## 2011-09-23 DIAGNOSIS — I10 Essential (primary) hypertension: Secondary | ICD-10-CM

## 2011-09-23 MED ORDER — METOPROLOL TARTRATE 25 MG PO TABS: ORAL_TABLET | ORAL | Status: DC

## 2011-09-23 NOTE — Assessment & Plan Note (Signed)
I will defer to her primary provider.

## 2011-09-23 NOTE — Assessment & Plan Note (Signed)
Her BP is very slightly elevated.  Given this I think she should tolerate the beta blocker if she has no bradycardia.

## 2011-09-23 NOTE — Assessment & Plan Note (Signed)
The patient has no new sypmtoms.  No further cardiovascular testing is indicated.  We will continue with aggressive risk reduction and meds as listed.  

## 2011-09-23 NOTE — Patient Instructions (Signed)
Please restart Metoprolol  Continue all other medications as listed  Follow up in 4 months with Dr Antoine Poche.  You will receive a letter in the mail 2 months before you are due.  Please call us when you receive this letter to schedule your follow up appointment.

## 2011-09-23 NOTE — Assessment & Plan Note (Signed)
I suspect that she is having PSVT.  At discharge last year she was on a very low dose of beta blocker. She was on this when I saw her most recently. Somewhere along the line this was discontinued though she does not recall why. I don't have any notes in our system indicating any telephone calls to discuss any problems with the medication. I discussed this with her son. For now I will restart low-dose beta blocker. However, if Dr. Chilton Si knows of a reason that this was discontinued he can stop this.  She also knows that if she has any sustained episodes she should call 911.

## 2011-09-23 NOTE — Progress Notes (Signed)
HPI The patient presents for evaluation of palpitations. She has a history of supraventricular tachycardia. However, she's not had sustained episodes in quite some time. I reviewed hospital records from 2010 and 2011.  However, recently she has had some palpitations. The most severe episode was while at church. She was seated. She felt her heart beating fast and she got up to go to the back of the church. She did not have any presyncope or syncope. She did not have any chest pressure, neck or arm discomfort. She thinks this only lasted for several seconds or a minute. He did not have any shortness of breath, PND or orthopnea. She is otherwise getting around in her retirement community though she's not exercising or doing any of the group physical activities.  No Known Allergies  Current Outpatient Prescriptions  Medication Sig Dispense Refill  . acetaminophen (TYLENOL) 325 MG tablet Take 650 mg by mouth every 6 (six) hours as needed.        Marland Kitchen amLODipine-benazepril (LOTREL) 5-10 MG per capsule Take 1 capsule by mouth daily.        Marland Kitchen aspirin 81 MG tablet Take 81 mg by mouth daily.        Marland Kitchen atorvastatin (LIPITOR) 20 MG tablet Take 20 mg by mouth daily.        . brimonidine (ALPHAGAN) 0.15 % ophthalmic solution UAD       . ezetimibe (ZETIA) 10 MG tablet Take 10 mg by mouth daily.        . fish oil-omega-3 fatty acids 1000 MG capsule Take 2 g by mouth daily.        . isosorbide mononitrate (IMDUR) 60 MG 24 hr tablet Take 60 mg by mouth daily.        Marland Kitchen latanoprost (XALATAN) 0.005 % ophthalmic solution UAD       . Multiple Vitamin (MULTIVITAMIN) tablet Take 1 tablet by mouth daily.        . Multiple Vitamins-Minerals (ICAPS AREDS FORMULA PO) Take by mouth. daily       . pantoprazole (PROTONIX) 40 MG tablet Take 40 mg by mouth daily.          Past Medical History  Diagnosis Date  . SVT (supraventricular tachycardia)   . HTN (hypertension)   . Dyslipidemia   . CAD (coronary artery disease)     cath  4/10. distal LAD 100%, circ distal 60%, OM2 30%   . GI bleeding     Past Surgical History  Procedure Date  . Hip surgery     ROS: As stated in the HPI and negative for all other systems.  PHYSICAL EXAM BP 143/81  Pulse 63  Ht 5\' 6"  (1.676 m)  Wt 159 lb (72.122 kg)  BMI 25.66 kg/m2 GENERAL:  Well appearing HEENT:  Pupils equal round and reactive, fundi not visualized, oral mucosa unremarkable NECK:  No jugular venous distention, waveform within normal limits, carotid upstroke brisk and symmetric, no bruits, no thyromegaly, small submandibular firm subcutaneous nodule. LYMPHATICS:  No cervical, inguinal adenopathy LUNGS:  Clear to auscultation bilaterally BACK:  No CVA tenderness CHEST:  Unremarkable HEART:  PMI not displaced or sustained,S1 and S2 within normal limits, no S3, no S4, no clicks, no rubs, no murmurs ABD:  Flat, positive bowel sounds normal in frequency in pitch, no bruits, no rebound, no guarding, no midline pulsatile mass, no hepatomegaly, no splenomegaly EXT:  2 plus pulses throughout, right greater than left edema, no cyanosis no clubbing, chronic venous stasis  changes. SKIN:  No rashes no nodules, bruising NEURO:  Cranial nerves II through XII grossly intact, motor grossly intact throughout PSYCH:  Cognitively intact, oriented to person place and time   EKG:  Sinus rhythm, rate 63, left axis deviation, interventricular conduction delay, premature ectopic complex, poor anterior R-wave progression, repolarization changes.  No change from previous.  ASSESSMENT AND PLAN

## 2011-11-10 DIAGNOSIS — R269 Unspecified abnormalities of gait and mobility: Secondary | ICD-10-CM | POA: Insufficient documentation

## 2011-12-10 ENCOUNTER — Ambulatory Visit: Payer: Self-pay | Admitting: Cardiology

## 2012-01-21 ENCOUNTER — Ambulatory Visit (INDEPENDENT_AMBULATORY_CARE_PROVIDER_SITE_OTHER): Payer: Medicare Other | Admitting: Cardiology

## 2012-01-21 ENCOUNTER — Encounter: Payer: Self-pay | Admitting: Cardiology

## 2012-01-21 DIAGNOSIS — I2510 Atherosclerotic heart disease of native coronary artery without angina pectoris: Secondary | ICD-10-CM

## 2012-01-21 DIAGNOSIS — I219 Acute myocardial infarction, unspecified: Secondary | ICD-10-CM

## 2012-01-21 DIAGNOSIS — I471 Supraventricular tachycardia: Secondary | ICD-10-CM

## 2012-01-21 DIAGNOSIS — I10 Essential (primary) hypertension: Secondary | ICD-10-CM

## 2012-01-21 NOTE — Patient Instructions (Signed)
The current medical regimen is effective;  continue present plan and medications.  Follow up in 1 year with Dr Hochrein.  You will receive a letter in the mail 2 months before you are due.  Please call us when you receive this letter to schedule your follow up appointment.  

## 2012-01-21 NOTE — Assessment & Plan Note (Signed)
She is having no paroxysms of this. No change in therapy is indicated.

## 2012-01-21 NOTE — Assessment & Plan Note (Signed)
I will continue with aggressive risk reduction. 

## 2012-01-21 NOTE — Assessment & Plan Note (Signed)
The blood pressure is at target. No change in medications is indicated. We will continue with therapeutic lifestyle changes (TLC).  

## 2012-01-21 NOTE — Progress Notes (Signed)
HPI The patient presents for evaluation of palpitations. I described at the time of the last visit. I started her on a low dose of beta blocker. She has since done well. The patient denies any new symptoms such as chest discomfort, neck or arm discomfort. There has been no new shortness of breath, PND or orthopnea. There have been no reported palpitations, presyncope or syncope.  No Known Allergies  Current Outpatient Prescriptions  Medication Sig Dispense Refill  . acetaminophen (TYLENOL) 325 MG tablet Take 650 mg by mouth every 6 (six) hours as needed.        Marland Kitchen amLODipine-benazepril (LOTREL) 5-10 MG per capsule Take 1 capsule by mouth daily.        Marland Kitchen aspirin 81 MG tablet Take 81 mg by mouth daily.        Marland Kitchen atorvastatin (LIPITOR) 20 MG tablet Take 20 mg by mouth daily.        . brimonidine (ALPHAGAN) 0.15 % ophthalmic solution UAD       . diphenhydrAMINE (BENADRYL) 25 MG tablet Take 25 mg by mouth at bedtime as needed. As needed for sleep      . ezetimibe (ZETIA) 10 MG tablet Take 10 mg by mouth daily.        . fish oil-omega-3 fatty acids 1000 MG capsule Take 2 g by mouth daily.        . isosorbide mononitrate (IMDUR) 60 MG 24 hr tablet Take 60 mg by mouth daily.        Marland Kitchen latanoprost (XALATAN) 0.005 % ophthalmic solution UAD       . levothyroxine (SYNTHROID, LEVOTHROID) 25 MCG tablet Take 25 mcg by mouth daily.      . metoprolol tartrate (LOPRESSOR) 25 MG tablet Take one-half tablet twice daily.  30 tablet  11  . Multiple Vitamin (MULTIVITAMIN) tablet Take 1 tablet by mouth daily.        . Multiple Vitamins-Minerals (ICAPS AREDS FORMULA PO) Take by mouth. daily       . pantoprazole (PROTONIX) 40 MG tablet Take 40 mg by mouth daily.          Past Medical History  Diagnosis Date  . SVT (supraventricular tachycardia)   . HTN (hypertension)   . Dyslipidemia   . CAD (coronary artery disease)     cath 4/10. distal LAD 100%, circ distal 60%, OM2 30%   . GI bleeding     Past Surgical  History  Procedure Date  . Hip surgery     ROS: As stated in the HPI and negative for all other systems.  PHYSICAL EXAM BP 110/50  Pulse 51  Ht 5\' 6"  (1.676 m)  Wt 155 lb (70.308 kg)  BMI 25.02 kg/m2 GENERAL:  Well appearing HEENT:  Pupils equal round and reactive, fundi not visualized, oral mucosa unremarkable NECK:  No jugular venous distention, waveform within normal limits, carotid upstroke brisk and symmetric, no bruits, no thyromegaly, small submandibular firm subcutaneous nodule. LYMPHATICS:  No cervical, inguinal adenopathy LUNGS:  Clear to auscultation bilaterally BACK:  No CVA tenderness CHEST:  Unremarkable HEART:  PMI not displaced or sustained,S1 and S2 within normal limits, no S3, no S4, no clicks, no rubs,  Murmur left upper sternal border ABD:  Flat, positive bowel sounds normal in frequency in pitch, no bruits, no rebound, no guarding, no midline pulsatile mass, no hepatomegaly, no splenomegaly EXT:  2 plus pulses throughout, right greater than left edema, no cyanosis no clubbing, chronic venous stasis changes.  EKG:  01/21/2012   sinus rhythm, right axis deviation, rate 51, lateral T-wave inversions unchanged from previous. Suspect axis changes limb lead reversal.  ASSESSMENT AND PLAN

## 2012-03-04 ENCOUNTER — Emergency Department (HOSPITAL_COMMUNITY): Payer: Medicare Other

## 2012-03-04 ENCOUNTER — Other Ambulatory Visit: Payer: Self-pay

## 2012-03-04 ENCOUNTER — Emergency Department (HOSPITAL_COMMUNITY)
Admission: EM | Admit: 2012-03-04 | Discharge: 2012-03-04 | Disposition: A | Discharge status: Home Health | Payer: Medicare Other | Attending: Emergency Medicine | Admitting: Emergency Medicine

## 2012-03-04 ENCOUNTER — Encounter (HOSPITAL_COMMUNITY): Payer: Self-pay | Admitting: Neurology

## 2012-03-04 DIAGNOSIS — R110 Nausea: Secondary | ICD-10-CM | POA: Insufficient documentation

## 2012-03-04 DIAGNOSIS — I2510 Atherosclerotic heart disease of native coronary artery without angina pectoris: Secondary | ICD-10-CM | POA: Insufficient documentation

## 2012-03-04 DIAGNOSIS — R42 Dizziness and giddiness: Secondary | ICD-10-CM | POA: Insufficient documentation

## 2012-03-04 DIAGNOSIS — E785 Hyperlipidemia, unspecified: Secondary | ICD-10-CM | POA: Insufficient documentation

## 2012-03-04 DIAGNOSIS — M25569 Pain in unspecified knee: Secondary | ICD-10-CM | POA: Insufficient documentation

## 2012-03-04 DIAGNOSIS — I951 Orthostatic hypotension: Secondary | ICD-10-CM

## 2012-03-04 DIAGNOSIS — I10 Essential (primary) hypertension: Secondary | ICD-10-CM | POA: Insufficient documentation

## 2012-03-04 DIAGNOSIS — M79609 Pain in unspecified limb: Secondary | ICD-10-CM

## 2012-03-04 DIAGNOSIS — I252 Old myocardial infarction: Secondary | ICD-10-CM | POA: Insufficient documentation

## 2012-03-04 DIAGNOSIS — M1990 Unspecified osteoarthritis, unspecified site: Secondary | ICD-10-CM | POA: Insufficient documentation

## 2012-03-04 LAB — URINE MICROSCOPIC-ADD ON

## 2012-03-04 LAB — CBC
HCT: 42.2 % (ref 36.0–46.0)
MCH: 33.3 pg (ref 26.0–34.0)
MCHC: 33.6 g/dL (ref 30.0–36.0)
RDW: 13.3 % (ref 11.5–15.5)

## 2012-03-04 LAB — URINALYSIS, ROUTINE W REFLEX MICROSCOPIC
Bilirubin Urine: NEGATIVE
Hgb urine dipstick: NEGATIVE
Ketones, ur: NEGATIVE mg/dL
Nitrite: NEGATIVE
Specific Gravity, Urine: 1.014 (ref 1.005–1.030)
Urobilinogen, UA: 1 mg/dL (ref 0.0–1.0)

## 2012-03-04 LAB — DIFFERENTIAL
Basophils Absolute: 0 10*3/uL (ref 0.0–0.1)
Basophils Relative: 0 % (ref 0–1)
Eosinophils Absolute: 0.1 10*3/uL (ref 0.0–0.7)
Eosinophils Relative: 2 % (ref 0–5)
Monocytes Absolute: 0.6 10*3/uL (ref 0.1–1.0)
Monocytes Relative: 8 % (ref 3–12)
Neutro Abs: 5.6 10*3/uL (ref 1.7–7.7)

## 2012-03-04 LAB — COMPREHENSIVE METABOLIC PANEL
AST: 19 U/L (ref 0–37)
Albumin: 3.2 g/dL — ABNORMAL LOW (ref 3.5–5.2)
BUN: 19 mg/dL (ref 6–23)
Calcium: 9.3 mg/dL (ref 8.4–10.5)
Creatinine, Ser: 1.08 mg/dL (ref 0.50–1.10)
Total Protein: 6.6 g/dL (ref 6.0–8.3)

## 2012-03-04 LAB — TYPE AND SCREEN: Antibody Screen: NEGATIVE

## 2012-03-04 LAB — PROTIME-INR
INR: 1.02 (ref 0.00–1.49)
Prothrombin Time: 13.6 seconds (ref 11.6–15.2)

## 2012-03-04 LAB — APTT: aPTT: 29 seconds (ref 24–37)

## 2012-03-04 LAB — D-DIMER, QUANTITATIVE: D-Dimer, Quant: 0.75 ug/mL-FEU — ABNORMAL HIGH (ref 0.00–0.48)

## 2012-03-04 MED ORDER — SODIUM CHLORIDE 0.9 % IV SOLN
1000.0000 mL | INTRAVENOUS | Status: DC
  Administered 2012-03-04: 1000 mL via INTRAVENOUS

## 2012-03-04 MED ORDER — MORPHINE SULFATE 2 MG/ML IJ SOLN
2.0000 mg | INTRAMUSCULAR | Status: DC | PRN
  Administered 2012-03-04: 2 mg via INTRAVENOUS
  Filled 2012-03-04: qty 1

## 2012-03-04 MED ORDER — TRAMADOL HCL 50 MG PO TABS: 50.0000 mg | ORAL_TABLET | Freq: Four times a day (QID) | ORAL | Status: AC | PRN

## 2012-03-04 NOTE — ED Provider Notes (Signed)
History     CSN: [id number]  Arrival date & time 03/04/12  1027   First MD Initiated Contact with Patient 03/04/12 1034      Chief Complaint  Patient presents with  . Hypotension  . Knee Pain    (Consider location/radiation/quality/duration/timing/severity/associated sxs/prior treatment) HPI The patient presents to the emergency room with a chief complaint of left knee pain. Patient states that she went to use the bathroom this morning and when she went to get up she experienced severe pain in her left knee. Patient states she was not able to get up because of that pain. she was sitting on the commode for 2 hours before they were able to get in touch with the personnel at [facility]. The patient states her husband does not hear well and he did not hear her calling. When they went to assist her they noted that her blood pressure was low in the 60s systolic and that she was complaining of dizziness and nausea. Patient denies this and states her knee was just hurting. EMS arrived and found her blood pressure to be 90/58. When they rechecked it was 105/56. Patient denies any chest pain or shortness of breath. She denies any other discomfort other than the knee pain. She denies any recent falls. She has not had any fevers. Past Medical History  Diagnosis Date  . SVT (supraventricular tachycardia)   . HTN (hypertension)   . Dyslipidemia   . CAD (coronary artery disease)     cath 4/10. distal LAD 100%, circ distal 60%, OM2 30%   . GI bleeding   . MI (myocardial infarction)     Past Surgical History  Procedure Date  . Hip surgery     Family History  Problem Relation Age of Onset  . Stroke Neg Hx   . Heart attack Neg Hx     History  Substance Use Topics  . Smoking status: Never Smoker   . Smokeless tobacco: Never Used   Comment: denies smoking cigarettes  . Alcohol Use: Yes     drinks a scotch or wine every other day     OB History    Grav Para Term  Preterm Abortions TAB SAB Ect Mult Living                  Review of Systems  All other systems reviewed and are negative.    Allergies  Review of patient's allergies indicates no known allergies.  Home Medications   Current Outpatient Rx  Name Route Sig Dispense Refill  . ACETAMINOPHEN 325 MG PO TABS Oral Take 650 mg by mouth every 6 (six) hours as needed.      Marland Kitchen AMLODIPINE BESY-BENAZEPRIL HCL 5-10 MG PO CAPS Oral Take 1 capsule by mouth daily.      . ASPIRIN 81 MG PO TABS Oral Take 81 mg by mouth daily.      . ATORVASTATIN CALCIUM 20 MG PO TABS Oral Take 20 mg by mouth daily.      Marland Kitchen BRIMONIDINE TARTRATE 0.15 % OP SOLN  UAD     . DIPHENHYDRAMINE HCL 25 MG PO TABS Oral Take 25 mg by mouth at bedtime as needed. As needed for sleep    . EZETIMIBE 10 MG PO TABS Oral Take 10 mg by mouth daily.      . OMEGA-3 FATTY ACIDS 1000 MG PO CAPS Oral Take 2 g by mouth daily.      . ISOSORBIDE MONONITRATE ER 60 MG  PO TB24 Oral Take 60 mg by mouth daily.      Marland Kitchen LATANOPROST 0.005 % OP SOLN  UAD     . METOPROLOL TARTRATE 25 MG PO TABS  Take one-half tablet twice daily. 30 tablet 11  . ONE-DAILY MULTI VITAMINS PO TABS Oral Take 1 tablet by mouth daily.      . ICAPS AREDS FORMULA PO Oral Take by mouth. daily     . PANTOPRAZOLE SODIUM 40 MG PO TBEC Oral Take 40 mg by mouth daily.        BP 102/59  Pulse 63  Temp(Src) 97.6 F (36.4 C) (Oral)  Resp 20  SpO2 98%  Physical Exam  Nursing note and vitals reviewed. Constitutional: She appears well-developed and well-nourished. No distress.  HENT:  Head: Normocephalic and atraumatic.  Right Ear: External ear normal.  Left Ear: External ear normal.  Eyes: Conjunctivae are normal. Right eye exhibits no discharge. Left eye exhibits no discharge. No scleral icterus.  Neck: Neck supple. No tracheal deviation present.  Cardiovascular: Normal rate, regular rhythm and intact distal pulses.   Pulmonary/Chest: Effort normal and breath sounds normal. No  stridor. No respiratory distress. She has no wheezes. She has no rales.  Abdominal: Soft. Bowel sounds are normal. She exhibits no distension. There is no tenderness. There is no rebound and no guarding.  Musculoskeletal: She exhibits tenderness. She exhibits no edema.       Left knee: She exhibits decreased range of motion, swelling and bony tenderness. She exhibits no deformity and no erythema. tenderness found.  Neurological: She is alert. She has normal strength. No sensory deficit. Cranial nerve deficit:  no gross defecits noted. She exhibits normal muscle tone. She displays no seizure activity. Coordination normal.  Skin: Skin is warm and dry. No rash noted.  Psychiatric: She has a normal mood and affect.    ED Course  Procedures (including critical care time)  Date: 03/04/2012  Rate: 89  Rhythm: normal sinus rhythm  QRS Axis: left  Intervals: normal  ST/T Wave abnormalities: normal  Conduction Disutrbances:right bundle branch block  Narrative Interpretation:   Old EKG Reviewed: changes noted increase QRS duration, left bundle branch block pattern was noted on prior EKG   Labs Reviewed  COMPREHENSIVE METABOLIC PANEL - Abnormal; Notable for the following:    Albumin 3.2 (*)    GFR calc non Af Amer 44 (*)    GFR calc Af Amer 51 (*)    All other components within normal limits  D-DIMER, QUANTITATIVE - Abnormal; Notable for the following:    D-Dimer, Quant 0.75 (*)    All other components within normal limits  CBC  DIFFERENTIAL  PROTIME-INR  APTT  TYPE AND SCREEN  TROPONIN I  URINALYSIS, ROUTINE W REFLEX MICROSCOPIC   Dg Chest 2 View  03/04/2012  *RADIOLOGY REPORT*  Clinical Data: Hypotension.  History of fall.  CHEST - 2 VIEW  Comparison: Chest x-ray 05/04/2009.  Findings: Lung volumes are normal.  No consolidative airspace disease.  No pleural effusions.  Pulmonary vasculature is normal. Heart size is mildly enlarged (increased compared to prior study from 05/04/2009).   Mediastinal contours are unremarkable. Atherosclerosis in the thoracic aorta.  IMPRESSION: 1.  No radiographic evidence of acute cardiopulmonary disease. 2.  However, there is now evidence of cardiomegaly, which is new compared to the prior examination from 05/04/2009. 3.  Atherosclerosis.  Original Report Authenticated By: Florencia Reasons, M.D.   Dg Knee Complete 4 Views Left  03/04/2012  *  RADIOLOGY REPORT*  Clinical Data: History of fall complaining of knee pain.  LEFT KNEE - COMPLETE 4+ VIEW  Comparison: No priors.  Findings: Multiple views of the left knee demonstrate no definite acute displaced fracture.  There is extensive joint space narrowing, subchondral sclerosis and osteophyte formation throughout the knee joint, most severe in the medial and patellofemoral compartments, consistent with advanced osteoarthritis.  Multiple vascular calcifications are noted.  IMPRESSION: 1.  No definite acute radiographic abnormality of the left knee. 2.  Advanced tricompartmental osteoarthritis, most severe in the medial and patellofemoral compartments.  Original Report Authenticated By: Florencia Reasons, M.D.   VASCULAR LAB  PRELIMINARY PRELIMINARY PRELIMINARY PRELIMINARY  Left lower extremity venous duplex has been completed.  Preliminary report: Left leg is negative for deep and superficial vein thrombosis.  Vanna Scotland, RVT  03/04/2012, 1:25 PM      1. Osteoarthritis   2. Orthostatic hypotension       MDM  The patient's x-rays show advanced tricompartmental osteoarthritis. I suspect this is the cause of her knee pain. I doubt infection. The vascular study does not show a blood clot. She has responded well to pain medications and I will discharge her home with Ultr[facility]. She does have a walker at home. Family is comfortable transporting her back to her living facility.  Regarding the hypotension that she had earlier, I suspect this was related to orthostatic hypotension. The patient had been  sitting on the commode for 2 hours because of her knee pain. Her blood pressure has remained normal here without treatment. Her last systolic blood pressure was in the 140s. She denies any chest pain or shortness of breath to suggest any other etiology for her hypotension.        Celene Kras, MD 03/04/12 1409

## 2012-03-04 NOTE — ED Notes (Signed)
Per ems- Pt comes from [facility]. Pt got up to go to the bathroom, when standing up unable due to sudden left knee pain. Denying any hx of fall. Staff took bp reporting 60 systolic, pt c/o of dizziness, nausea initially. EMS reporting bp 90/58, with irregular. Then 105/56. Pt denying any CP. Alert and oriented

## 2012-03-04 NOTE — Progress Notes (Signed)
VASCULAR LAB PRELIMINARY  PRELIMINARY  PRELIMINARY  PRELIMINARY  Left lower extremity venous duplex has been completed.    Preliminary report:  Left leg is negative for deep and superficial vein thrombosis.  Vanna Scotland,  RVT 03/04/2012, 1:25 PM

## 2012-03-04 NOTE — ED Notes (Signed)
Patient is aware we need a urine sample.Patient attempted to void on bed pan. States she "feels she cannot go right now". Will try again soon.

## 2012-03-04 NOTE — Discharge Instructions (Signed)
Degenerative Arthritis You have osteoarthritis. This is the wear and tear arthritis that comes with aging. It is also called degenerative arthritis. This is common in people past middle age. It is caused by stress on the joints. The large weight bearing joints of the lower extremities are most often affected. The knees, hips, back, neck, and hands can become painful, swollen, and stiff. This is the most common type of arthritis. It comes on with age, carrying too much weight, or from an injury. Treatment includes resting the sore joint until the pain and swelling improve. Crutches or a walker may be needed for severe flares. Only take over-the-counter or prescription medicines for pain, discomfort, or fever as directed by your caregiver. Local heat therapy may improve motion. Cortisone shots into the joint are sometimes used to reduce pain and swelling during flares. Osteoarthritis is usually not crippling and progresses slowly. There are things you can do to decrease pain:  Avoid high impact activities.   Exercise regularly.   Low impact exercises such as walking, biking and swimming help to keep the muscles strong and keep normal joint function.   Stretching helps to keep your range of motion.   Lose weight if you are overweight. This reduces joint stress.  In severe cases when you have pain at rest or increasing disability, joint surgery may be helpful. See your caregiver for follow-up treatment as recommended.  SEEK IMMEDIATE MEDICAL CARE IF:   You have severe joint pain.   Marked swelling and redness in your joint develops.   You develop a high fever.  Document Released: 12/16/2005 Document Revised: 12/05/2011 Document Reviewed: 05/18/2007 [facility] Patient Information 2012 [place], [place].Hypotension As your heart beats, it forces blood through your arteries. This force is your blood pressure. If your blood pressure is too low for you to go about your normal activities or support the  organs of your body, you have hypotension, or low blood pressure. When your blood pressure becomes too low, you may not get enough blood to your brain, and you may feel weak, lightheaded, or develop a more rapid heart rate. In a more severe case, you may faint: this is a sudden, brief loss of consciousness where you pass out and recover completely. CAUSES  Loss of blood or fluids from the body. This occurs during rapid blood loss. It can also come from dehydration when the body is not taking in enough fluids or is losing fluids faster than they can be replaced. Examples of this would be severe vomiting and diarrhea.   Not taking in enough fluids and salts. This is common in the elderly where thirst mechanisms are not working as well. This means you do not feel thirsty and you do not take in enough water.   Use of blood pressure pills and other medications that may lower the blood pressure below normal.   Over medication (always take your medications as directed).   Irregular heart beat or heart failure when the heart is no longer working well enough to support blood pressure.  Hospitalization is sometimes required for low blood pressure if fluid or blood replacement is needed, if time is needed for medications to wear off, or if further evaluation is needed. Less common causes of low blood pressure might include peripheral or autonomic neuropathy (nerve problems), Parkinson's disease, or other illnesses. Treatment might include a change in diet, change in medications (including medicines aimed at raising your blood pressure), and use of support stockings. HOME CARE INSTRUCTIONS  Maintain good fluid intake and use a little more salt on your food (if you are not on a restricted diet or having problems with your heart such as heart failure). This is especially important for the elderly when you may not feel thirsty in the winter.   Take your medications as directed.   Get up slowly from reclining or  sitting positions. This gives your blood pressure a chance to adjust. As we grow older our ability to regulate our blood pressure may not be as good as when we were younger.   Wear support stockings if prescribed.   Use walkers, canes, etc., if advised.   Talk with your physician or nurse about a Home Safety Evaluation (usually done by visiting nurses).  SEEK IMMEDIATE MEDICAL CARE IF:   You have a fainting episode. Do not drive yourself. Call 911 if no other help is available.   You have chest pain, nausea (feeling sick to your stomach) or vomiting.   You have a loss of feeling in some part of your body, or lose movement in your arms or legs.   You have difficulty with speech.   You become sweaty and/or feel light headed.  Make sure you are re-checked as instructed. MAKE SURE YOU:   Understand these instructions.   Will watch your condition.   Will get help right away if you are not doing well or get worse.  Document Released: 12/16/2005 Document Revised: 12/05/2011 Document Reviewed: 08/05/2008 [facility] Patient Information 2012 [place], [place].

## 2012-03-04 NOTE — ED Notes (Signed)
Pt d/c home in NAD. Pt d/c with family. Voiced understanding of d/c instructions.

## 2012-04-10 ENCOUNTER — Emergency Department (INDEPENDENT_AMBULATORY_CARE_PROVIDER_SITE_OTHER): Payer: Medicare Other

## 2012-04-10 ENCOUNTER — Emergency Department (HOSPITAL_BASED_OUTPATIENT_CLINIC_OR_DEPARTMENT_OTHER)
Admission: EM | Admit: 2012-04-10 | Discharge: 2012-04-10 | Disposition: A | Discharge status: Nursing Home (Includes ICF) | Payer: Medicare Other | Attending: Emergency Medicine | Admitting: Emergency Medicine

## 2012-04-10 ENCOUNTER — Encounter (HOSPITAL_BASED_OUTPATIENT_CLINIC_OR_DEPARTMENT_OTHER): Payer: Self-pay | Admitting: *Deleted

## 2012-04-10 DIAGNOSIS — S72109A Unspecified trochanteric fracture of unspecified femur, initial encounter for closed fracture: Secondary | ICD-10-CM | POA: Insufficient documentation

## 2012-04-10 DIAGNOSIS — S72113A Displaced fracture of greater trochanter of unspecified femur, initial encounter for closed fracture: Secondary | ICD-10-CM

## 2012-04-10 DIAGNOSIS — I10 Essential (primary) hypertension: Secondary | ICD-10-CM | POA: Insufficient documentation

## 2012-04-10 DIAGNOSIS — W19XXXA Unspecified fall, initial encounter: Secondary | ICD-10-CM

## 2012-04-10 DIAGNOSIS — W108XXA Fall (on) (from) other stairs and steps, initial encounter: Secondary | ICD-10-CM | POA: Insufficient documentation

## 2012-04-10 DIAGNOSIS — E785 Hyperlipidemia, unspecified: Secondary | ICD-10-CM | POA: Insufficient documentation

## 2012-04-10 DIAGNOSIS — I252 Old myocardial infarction: Secondary | ICD-10-CM | POA: Insufficient documentation

## 2012-04-10 DIAGNOSIS — M25559 Pain in unspecified hip: Secondary | ICD-10-CM | POA: Insufficient documentation

## 2012-04-10 DIAGNOSIS — I2510 Atherosclerotic heart disease of native coronary artery without angina pectoris: Secondary | ICD-10-CM | POA: Insufficient documentation

## 2012-04-10 MED ORDER — ACETAMINOPHEN 500 MG PO TABS
ORAL_TABLET | ORAL | Status: AC
  Administered 2012-04-10: 1000 mg via ORAL
  Filled 2012-04-10: qty 2

## 2012-04-10 MED ORDER — HYDROCODONE-ACETAMINOPHEN 5-325 MG PO TABS: 2.0000 | ORAL_TABLET | ORAL | Status: AC | PRN

## 2012-04-10 MED ORDER — MORPHINE SULFATE 2 MG/ML IJ SOLN
INTRAMUSCULAR | Status: AC
  Administered 2012-04-10: 2 mg via INTRAMUSCULAR
  Filled 2012-04-10: qty 1

## 2012-04-10 MED ORDER — OXYCODONE HCL 5 MG PO TABS
5.0000 mg | ORAL_TABLET | Freq: Once | ORAL | Status: DC
  Filled 2012-04-10: qty 1

## 2012-04-10 MED ORDER — MORPHINE SULFATE 2 MG/ML IJ SOLN
2.0000 mg | Freq: Once | INTRAMUSCULAR | Status: AC
  Administered 2012-04-10: 2 mg via INTRAMUSCULAR

## 2012-04-10 MED ORDER — ACETAMINOPHEN 500 MG PO TABS
1000.0000 mg | ORAL_TABLET | Freq: Once | ORAL | Status: AC
  Administered 2012-04-10: 1000 mg via ORAL

## 2012-04-10 NOTE — ED Notes (Signed)
Able to ambulate to hallway with assistance from two staff members

## 2012-04-10 NOTE — Discharge Instructions (Signed)
Hip Fracture (Upper Femoral Fracture) You have a hip fracture (break in bone). This is a fracture of the upper part of the big bone (femur, thigh bone) between your hip and knee. If your caregiver feels it is a stable fracture, occasionally it can be treated without surgery. Usually these fractures are unstable. This means that the bones will not heal properly without surgery. Surgery is necessary to hold the bones together in a good position where they will heal well. DIAGNOSIS A physical exam can determine if a fracture has occurred. X-ray studies are needed to see what type of fracture is present and to look for other injuries. These studies will help your caregiver determine what the best treatment is for you. If there is more than one option, your caregiver can give you the information needed to help you decide on the treatment. TREATMENT  The treatment for an unstable fracture is usually surgery. This means using a screw, nail, or rod to hold the bones in place.  RISKS AND COMPLICATIONS All surgery is associated with risks. Sometimes the implant may fail. Other complications of surgery include infection or the bones not healing properly. Sometimes the fracture may damage the blood supply to the head of the femur. That portion of bone may die (osteonecrosis or avascular necrosis). Sometimes to avoid this complication, an implant is used which just replaces the ball of the femur (hemi-arthroplasty or prosthetic replacement). Some of the other risks are:  Excessive bleeding.   Infection.   Dislocation if a hemi-arthroplasty or a total hip was inserted.   Failure to heal properly resulting in an unstable hip.   Stiffness of hip following repair.   On occasion, blood may have to be replaced before or during the procedure  LET YOUR CAREGIVERS KNOW ABOUT:  Allergies.   Medications taken including herbs, eye drops, over the counter medications, and creams.   Use of steroids (by mouth or  creams).   History of bleeding or blood problems.   History of serious infection.   Previous problems with anesthetics or novocaine.   Possibility of pregnancy, if this applies.   History of blood clots (thrombophlebitis).   Previous surgery.   Other health problems.  BEFORE THE PROCEDURE Before surgery, an IV (intravenous line connected to your vein) may be started. You will be given an anesthetic (medications and gas to make you sleep) or given medications in your back to make you numb from the waist down (spinal anesthetic). AFTER THE PROCEDURE After surgery, you will be taken to the recovery area where a nurse will watch your progress. You may have a catheter (a long, narrow, hollow tube) in your bladder that helps you pass your water. Once you're awake, stable, and taking fluids well, you will be returned to your room. You will receive physical therapy and other care until you are doing well and your caregiver feels it is safe for you to be transferred either to home or to an extended care facility. Your activity level will change as your caregiver determines what is best for you.  You may resume normal diet and activities as directed or allowed.   Change dressings if necessary or as directed.   Only take over-the-counter or prescription medicines for pain, discomfort, or fever as directed by your caregiver.   You may be placed on blood thinners for 4-6 weeks to prevent blood clots.  SEEK IMMEDIATE MEDICAL CARE IF:  There is swelling of your calf or leg.   You  have shortness of breath or chest pain.   There is redness, swelling, or increasing pain in the wound.   There is pus coming from wound.   You have an unexplained oral temperature above 102 F (38.9 C).   There is a foul (bad) smell coming from the wound or dressing.   There is a breaking open of the wound (edges not staying together) after sutures or staples have been removed.   There is a marked increase in  pain or shortening of the leg.   You have severe pain anywhere in the leg.   There is any change in color or temperature of your leg below the injury.  MAKE SURE YOU:   Understand these instructions.   Will watch your condition.   Will get help right away if you are not doing well or get worse.  Document Released: 12/16/2005 Document Revised: 12/05/2011 Document Reviewed: 08/05/2008 [facility] Patient Information 2012 [place], [place].

## 2012-04-10 NOTE — ED Provider Notes (Signed)
History     CSN: [id number]  Arrival date & time 04/10/12  1115   First MD Initiated Contact with Patient 04/10/12 1115      Chief Complaint  Patient presents with  . Fall  . Hip Pain    (Consider location/radiation/quality/duration/timing/severity/associated sxs/prior treatment) HPI Comments: Patient was visiting her family and climbing some stairs when she tripped over a Training and development officer. She landed on her right hip has not been able to bear weight since. She complains of pain with external and internal rotation. She denies hitting her head or losing consciousness. She denies any chest pain, shortness of breath, dizziness or lightheadedness. No abdominal pain or back pain.  The history is provided by the patient and the EMS personnel.    Past Medical History  Diagnosis Date  . SVT (supraventricular tachycardia)   . HTN (hypertension)   . Dyslipidemia   . CAD (coronary artery disease)     cath 4/10. distal LAD 100%, circ distal 60%, OM2 30%   . GI bleeding   . MI (myocardial infarction)     Past Surgical History  Procedure Date  . Hip surgery     Family History  Problem Relation Age of Onset  . Stroke Neg Hx   . Heart attack Neg Hx     History  Substance Use Topics  . Smoking status: Never Smoker   . Smokeless tobacco: Never Used   Comment: denies smoking cigarettes  . Alcohol Use: Yes     drinks a scotch or wine every other day     OB History    Grav Para Term Preterm Abortions TAB SAB Ect Mult Living                  Review of Systems  Constitutional: Negative for fever, activity change and appetite change.  HENT: Negative for congestion and rhinorrhea.   Eyes: Negative for visual disturbance.  Respiratory: Negative for chest tightness.   Gastrointestinal: Negative for nausea, vomiting and abdominal pain.  Genitourinary: Negative for dysuria, vaginal bleeding and vaginal discharge.  Musculoskeletal: Positive for myalgias, arthralgias and gait  problem.  Skin: Positive for wound.  Neurological: Negative for dizziness, light-headedness and headaches.    Allergies  Review of patient's allergies indicates no known allergies.  Home Medications   Current Outpatient Rx  Name Route Sig Dispense Refill  . ACETAMINOPHEN 325 MG PO TABS Oral Take 650 mg by mouth every 6 (six) hours as needed. For pain    . AMLODIPINE BESY-BENAZEPRIL HCL 5-10 MG PO CAPS Oral Take 1 capsule by mouth daily.      . ASPIRIN 81 MG PO TABS Oral Take 81 mg by mouth every evening.     . ATORVASTATIN CALCIUM 20 MG PO TABS Oral Take 20 mg by mouth daily.      Marland Kitchen BRIMONIDINE TARTRATE 0.15 % OP SOLN Both Eyes Place 1 drop into both eyes every morning. UAD    . DIPHENHYDRAMINE HCL 25 MG PO TABS Oral Take 25 mg by mouth at bedtime as needed. As needed for sleep    . EZETIMIBE 10 MG PO TABS Oral Take 10 mg by mouth daily.      . OMEGA-3 FATTY ACIDS 1000 MG PO CAPS Oral Take 2 g by mouth daily.      Marland Kitchen HYDROCODONE-ACETAMINOPHEN 5-325 MG PO TABS Oral Take 2 tablets by mouth every 4 (four) hours as needed for pain. 10 tablet 0  . ISOSORBIDE MONONITRATE ER 60 MG PO  TB24 Oral Take 60 mg by mouth daily.      Marland Kitchen LATANOPROST 0.005 % OP SOLN Both Eyes Place 1 drop into both eyes every evening. UAD    . METOPROLOL TARTRATE 25 MG PO TABS Oral Take 12.5 mg by mouth 2 (two) times daily.    Marland Kitchen ONE-DAILY MULTI VITAMINS PO TABS Oral Take 1 tablet by mouth daily.      . ICAPS AREDS FORMULA PO Oral Take 1 tablet by mouth daily.    Marland Kitchen PANTOPRAZOLE SODIUM 40 MG PO TBEC Oral Take 40 mg by mouth daily.        BP 138/66  Pulse 50  Resp 20  SpO2 100%  Physical Exam  Constitutional: She is oriented to person, place, and time. She appears well-developed. No distress.  HENT:  Head: Normocephalic and atraumatic.  Mouth/Throat: Oropharynx is clear and moist. No oropharyngeal exudate.  Eyes: EOM are normal. Pupils are equal, round, and reactive to light.  Neck: Normal range of motion. Neck  supple.       No C-spine pain, step-off or deformity  Cardiovascular: Normal rate and regular rhythm.   Pulmonary/Chest: Effort normal and breath sounds normal. No respiratory distress.  Abdominal: Soft. There is no tenderness. There is no rebound and no guarding.  Musculoskeletal: She exhibits tenderness.       Right lateral hip tender to palpation. No obvious shortening or external rotation. Pain with leg rotation. 2 DP and PT pulses bilaterally. Able to dorsiflex toes and dorsi and plantar flex ankle  Neurological: She is alert and oriented to person, place, and time. No cranial nerve deficit.  Skin: Skin is warm.    ED Course  Procedures (including critical care time)  Labs Reviewed - No data to display Dg Hip Complete Right  04/10/2012  *RADIOLOGY REPORT*  Clinical Data: Larey Seat, pain.  RIGHT HIP - COMPLETE 2+ VIEW  Comparison: None.  Findings: Left T H R without dislocation.  Joint space narrowing right hip. No displaced hip fracture.  On the AP pelvis view, there is a slight lucency which courses inferomedially from the greater trochanter towards the lesser trochanter roughly corresponding to a  skin fold although it is difficult to see this skin fold across the medial cortex. This lucency is not clearly seen on the other projections.  If the patient refuses to bear weight on the right hip, or if there is strong clinical suspicion for fracture, MRI is recommended for further evaluation.  IMPRESSION: No visible displaced hip fracture is seen.  If however there is strong clinical suspicion  recommend MRI.  Original Report Authenticated By: Elsie Stain, M.D.   Ct Hip Right Wo Contrast  04/10/2012  *RADIOLOGY REPORT*  Clinical Data: Larey Seat, pain  CT OF THE RIGHT HIP WITHOUT CONTRAST  Technique:  Multidetector CT imaging was performed according to the standard protocol. Multiplanar CT image reconstructions were also generated.  Comparison: Plain films earlier today  Findings: Streak artifact from  the patient's left T H R mildly degrades the images.  There is no femoral neck or intertrochanteric fracture on the right.  There is a slightly comminuted and mildly impacted right greater trochanteric fracture, with soft tissue swelling.  Mild joint space narrowing.  Mild vascular calcification.  No evidence for pelvic fracture.  No evidence for acetabular disruption.  No visible loosening of the left acetabular component. Degenerative change pubic symphysis.  Grossly negative visualized soft tissue pelvis.  IMPRESSION: Slightly comminuted and mildly impacted right greater trochanteric  fracture. No evidence for femoral neck fracture.  Original Report Authenticated By: Elsie Stain, M.D.     1. Greater trochanter fracture       MDM  Mechanical fall with right hip pain. No obvious deformity. Patient's family arrives and confirms the fall was mechanical.  Femoral neck intact on CT. Impacted greater trochanter fracture. D/w D. Lestine Box who recommends walker or crutches and partial weightbearing.  Outpatient followup. We'll attempt to ambulate patient. She is in independent living at [facility] but there is assistance and rehabilitation available there per her family Patient able to ambulate and bear weight on RLE with assistance.  Family comfortable taking her back to living facility.  The do have inhouse rehabilitation and nursing assistance.   Glynn Octave, MD 04/10/12 1530

## 2012-04-10 NOTE — ED Notes (Signed)
Pt to room 5 by ems via stretcher. Pt is awake and alert, ems states pt was on leave from [employer] visiting family and tripped over a Training and development officer. Pt states she landed on her right hip "and it's sore..." pt has full rom, hips are non-tender, legs are = in length with no external or internal rotation. + pedal pulses bilat.

## 2012-05-20 ENCOUNTER — Other Ambulatory Visit: Payer: Self-pay | Admitting: Geriatric Medicine

## 2012-05-20 ENCOUNTER — Ambulatory Visit
Admission: RE | Admit: 2012-05-20 | Discharge: 2012-05-20 | Disposition: A | Discharge status: Home / Self Care | Payer: Medicare Other | Source: Ambulatory Visit | Attending: Internal Medicine | Admitting: Internal Medicine

## 2012-05-20 DIAGNOSIS — M545 Low back pain: Secondary | ICD-10-CM

## 2012-05-25 ENCOUNTER — Encounter (HOSPITAL_COMMUNITY): Payer: Self-pay | Admitting: Emergency Medicine

## 2012-05-25 ENCOUNTER — Emergency Department (HOSPITAL_COMMUNITY)
Admission: EM | Admit: 2012-05-25 | Discharge: 2012-05-25 | Disposition: A | Discharge status: Home / Self Care | Payer: Medicare Other | Attending: Emergency Medicine | Admitting: Emergency Medicine

## 2012-05-25 DIAGNOSIS — I471 Supraventricular tachycardia, unspecified: Secondary | ICD-10-CM | POA: Insufficient documentation

## 2012-05-25 DIAGNOSIS — I2510 Atherosclerotic heart disease of native coronary artery without angina pectoris: Secondary | ICD-10-CM | POA: Insufficient documentation

## 2012-05-25 DIAGNOSIS — I4949 Other premature depolarization: Secondary | ICD-10-CM | POA: Insufficient documentation

## 2012-05-25 DIAGNOSIS — Z79899 Other long term (current) drug therapy: Secondary | ICD-10-CM | POA: Insufficient documentation

## 2012-05-25 DIAGNOSIS — I447 Left bundle-branch block, unspecified: Secondary | ICD-10-CM | POA: Insufficient documentation

## 2012-05-25 DIAGNOSIS — I10 Essential (primary) hypertension: Secondary | ICD-10-CM | POA: Insufficient documentation

## 2012-05-25 DIAGNOSIS — E785 Hyperlipidemia, unspecified: Secondary | ICD-10-CM | POA: Insufficient documentation

## 2012-05-25 DIAGNOSIS — I252 Old myocardial infarction: Secondary | ICD-10-CM | POA: Insufficient documentation

## 2012-05-25 LAB — POCT I-STAT, CHEM 8
BUN: 26 mg/dL — ABNORMAL HIGH (ref 6–23)
Calcium, Ion: 1.11 mmol/L — ABNORMAL LOW (ref 1.12–1.32)
Creatinine, Ser: 1 mg/dL (ref 0.50–1.10)
Hemoglobin: 15 g/dL (ref 12.0–15.0)
Sodium: 139 mEq/L (ref 135–145)
TCO2: 27 mmol/L (ref 0–100)

## 2012-05-25 NOTE — Discharge Instructions (Signed)
Continue to take the metoprolol 12.5 mg twice a day  Supraventricular Tachycardia Supraventricular tachycardia (SVT) is an abnormal heart rhythm (arrhythmia) that causes the heart to beat very fast (tachycardia). This kind of fast heartbeat originates in the upper chambers of the heart (atria). SVT can cause the heart to beat greater than 100 beats per minute. SVT can have a rapid burst of heartbeats. This can start and stop suddenly without warning and is called nonsustained. SVT can also be sustained, in which the heart beats at a continuous fast rate.  CAUSES  There can be different causes of SVT. Some of these include:  Heart valve problems such as mitral valve prolapse.   An enlarged heart (hypertrophic cardiomyopathy).   Congenital heart problems.   Heart inflammation (pericarditis).   Hyperthyroidism.   Low potassium or magnesium levels.   Caffeine.   Drug use such as cocaine, methamphetamines, or stimulants.   Some over-the-counter medicines such as:   Decongestants.   Diet medicines.   Herbal medicines.  SYMPTOMS  Symptoms of SVT can vary. Symptoms depend on whether the SVT is sustained or nonsustained. You may experience:  No symptoms (asymptomatic).   An awareness of your heart beating rapidly (palpitations).   Shortness of breath.   Chest pain or pressure.  If your blood pressure drops because of the SVT, you may experience:  Fainting or near fainting.   Weakness.   Dizziness.  DIAGNOSIS  Different tests can be performed to diagnose SVT, such as:  An electrocardiogram (EKG). This is a painless test that records the electrical activity of your heart.   Holter monitor. This is a 24 hour recording of your heart rhythm. You will be given a diary. Write down all symptoms that you have and what you were doing at the time you experienced symptoms.   Arrhythmia monitor. This is a small device that your wear for several weeks. It records the heart rhythm when  you have symptoms.   Echocardiogram. This is an imaging test to help detect abnormal heart structure such as congenital abnormalities, heart valve problems, or heart enlargement.   Stress test. This test can help determine if the SVT is related to exercise.   Electrophysiology study (EPS). This is a procedure that evaluates your heart's electrical system and can help your caregiver find the cause of your SVT.  TREATMENT  Treatment of SVT depends on the symptoms, how often it recurs, and whether there are any underlying heart problems.   If symptoms are rare and no other cardiac disease is present, no treatment may be needed.   Blood work may be done to check potassium, magnesium, and thyroid hormone levels to see if they are abnormal. If these levels are abnormal, treatment to correct the problems will occur.  Medicines Your caregiver may use oral medicines to treat SVT. These medicines are given for long-term control of SVT. Medicines may be used alone or in combination with other treatments. These medicines work to slow nerve impulses in the heart muscle. These medicines can also be used to treat high blood pressure. Some of these medicines may include:  Calcium channel blockers.   Beta blockers.   Digoxin.  Nonsurgical procedures Nonsurgical techniques may be used if oral medicines do not work. Some examples include:  Cardioversion. This technique uses either drugs or an electrical shock to restore a normal heart rhythm.   Cardioversion drugs may be given through an intravenous (IV) line to help "reset" the heart rhythm.  In electrical cardioversion, the caregiver shocks your heart to stop its beat for a split second. This helps to reset the heart to a normal rhythm.   Ablation. This procedure is done under mild sedation. High frequency radio wave energy is used to destroy the area of heart tissue responsible for the SVT.  HOME CARE INSTRUCTIONS   Do not smoke.   Only take  medicines prescribed by your caregiver. Check with your caregiver before using over-the-counter medicines.   Check with your caregiver about how much alcohol and caffeine (coffee, tea, colas, or chocolate) you may have.   It is very important to keep all follow-up referrals and appointments in order to properly manage this problem.  SEEK IMMEDIATE MEDICAL CARE IF:  You have dizziness.   You faint or nearly faint.   You have shortness of breath.   You have chest pain or pressure.   You have sudden nausea or vomiting.   You have profuse sweating.   You are concerned about how long your symptoms last.   You are concerned about the frequency of your SVT episodes.  If you have the above symptoms, call your local emergency services (911 in [place] immediately. Do not drive yourself to the hospital. MAKE SURE YOU:   Understand these instructions.   Will watch your condition.   Will get help right away if you are not doing well or get worse.  Document Released: 12/16/2005 Document Revised: 12/05/2011 Document Reviewed: 03/30/2009 [facility] Patient Information 2012 [place], [place].

## 2012-05-25 NOTE — ED Notes (Signed)
Per EMs.  Pt from [facility].  Pt went to clinic today for heart palpitations.  Per staff pt's HR was 150s sustained and bp was 80s systolic.  Tachycardia and hypotension resolved prior to ems arrival.  Pt hr70s to 80s for ems, and bp 141/73.  Pt has no complaints at this time.

## 2012-05-25 NOTE — ED Provider Notes (Addendum)
History     CSN: [id number]  Arrival date & time 05/25/12  1827   First MD Initiated Contact with Patient 05/25/12 1909      Chief Complaint  Patient presents with  . Tachycardia    post tachcardia    (Consider location/radiation/quality/duration/timing/severity/associated sxs/prior treatment) The history is provided by the patient.   patient is from [facility]. She states she felt her heart going fast and felt lightheaded. She states she's going to get something to sleep. Went to clinic and had elevated HR and decreased BP. Resolved on EMS arrival. No complaints.   Past Medical History  Diagnosis Date  . SVT (supraventricular tachycardia)   . HTN (hypertension)   . Dyslipidemia   . CAD (coronary artery disease)     cath 4/10. distal LAD 100%, circ distal 60%, OM2 30%   . GI bleeding   . MI (myocardial infarction)     Past Surgical History  Procedure Date  . Hip surgery     Family History  Problem Relation Age of Onset  . Stroke Neg Hx   . Heart attack Neg Hx     History  Substance Use Topics  . Smoking status: Never Smoker   . Smokeless tobacco: Never Used   Comment: denies smoking cigarettes  . Alcohol Use: Yes     drinks a scotch or wine every other day     OB History    Grav Para Term Preterm Abortions TAB SAB Ect Mult Living                  Review of Systems  Constitutional: Negative for chills.  HENT: Negative for neck pain.   Respiratory: Negative for shortness of breath.   Cardiovascular: Positive for palpitations. Negative for chest pain.  Gastrointestinal: Negative for abdominal pain.  Genitourinary: Negative for dysuria.  Psychiatric/Behavioral: Negative for behavioral problems.    Allergies  Review of patient's allergies indicates no known allergies.  Home Medications   Current Outpatient Rx  Name Route Sig Dispense Refill  . ACETAMINOPHEN 325 MG PO TABS Oral Take 650 mg by mouth every 6 (six) hours as needed.  For pain    . AMLODIPINE BESY-BENAZEPRIL HCL 5-10 MG PO CAPS Oral Take 1 capsule by mouth daily.      . ASPIRIN 81 MG PO TABS Oral Take 81 mg by mouth every evening.     . ATORVASTATIN CALCIUM 20 MG PO TABS Oral Take 20 mg by mouth daily.      Marland Kitchen BRIMONIDINE TARTRATE 0.15 % OP SOLN Both Eyes Place 1 drop into both eyes every morning. UAD    . DIPHENHYDRAMINE HCL 25 MG PO TABS Oral Take 25 mg by mouth at bedtime as needed. As needed for sleep    . EZETIMIBE 10 MG PO TABS Oral Take 10 mg by mouth daily.      . OMEGA-3 FATTY ACIDS 1000 MG PO CAPS Oral Take 2 g by mouth daily.      Marland Kitchen HYDROCODONE-ACETAMINOPHEN 5-325 MG PO TABS Oral Take 1 tablet by mouth every 6 (six) hours as needed. pain    . ISOSORBIDE MONONITRATE ER 60 MG PO TB24 Oral Take 60 mg by mouth daily.      Marland Kitchen LATANOPROST 0.005 % OP SOLN Both Eyes Place 1 drop into both eyes every evening. UAD    . METOPROLOL TARTRATE 25 MG PO TABS Oral Take 12.5 mg by mouth 2 (two) times daily.    Marland Kitchen ONE-DAILY  MULTI VITAMINS PO TABS Oral Take 1 tablet by mouth daily.      . ICAPS AREDS FORMULA PO Oral Take 1 tablet by mouth daily.    Marland Kitchen PANTOPRAZOLE SODIUM 40 MG PO TBEC Oral Take 40 mg by mouth daily.        BP 157/80  Pulse 69  Temp(Src) 97.5 F (36.4 C) (Oral)  Resp 16  SpO2 99%  Physical Exam  Constitutional: She appears well-developed.  HENT:  Head: Normocephalic.  Eyes: Pupils are equal, round, and reactive to light.  Neck: Normal range of motion.  Cardiovascular: Normal rate.   Pulmonary/Chest: Effort normal.  Abdominal: Soft. There is no tenderness.  Neurological: She is alert.  Skin: Skin is warm.    ED Course  Procedures (including critical care time)  Labs Reviewed  POCT I-STAT, CHEM 8 - Abnormal; Notable for the following:    BUN 26 (*)    Calcium, Ion 1.11 (*)    All other components within normal limits  TROPONIN I   No results found.   1. PSVT     Date: 05/25/2012  Rate: 66  Rhythm: normal sinus rhythm and  premature ventricular contractions (PVC)  QRS Axis: normal  Intervals: normal  ST/T Wave abnormalities: nonspecific ST/T changes  Conduction Disutrbances:left bundle branch block  Narrative Interpretation:   Old EKG Reviewed: unchanged     MDM  Patient with likely SVT. History of same. Has been of of metoprolol. Will d/c hr improved before EMS arrival        [place] R. Rubin Payor, MD 05/25/12 2114  Juliet Rude. Rubin Payor, MD 07/06/12 1450

## 2012-05-25 NOTE — ED Notes (Signed)
ZOX:WRUE<AV> Expected date:<BR> Expected time: 6:19 PM<BR> Means of arrival:Ambulance<BR> Comments:<BR> M30 -- POST Tachy/Hypotensive

## 2012-05-25 NOTE — ED Notes (Signed)
Pt up in bed eating. Pt w/o any s/s of distress or pain. Pt denies any sob or heart palpatation.

## 2012-05-25 NOTE — ED Notes (Signed)
EKG given to Dr Pickering 

## 2012-05-27 ENCOUNTER — Encounter: Payer: Self-pay | Admitting: Physician Assistant

## 2012-06-08 ENCOUNTER — Encounter: Payer: Medicare Other | Admitting: Physician Assistant

## 2012-06-09 ENCOUNTER — Encounter: Payer: Self-pay | Admitting: Physician Assistant

## 2012-06-09 ENCOUNTER — Ambulatory Visit (INDEPENDENT_AMBULATORY_CARE_PROVIDER_SITE_OTHER): Payer: Medicare Other | Admitting: Physician Assistant

## 2012-06-09 VITALS — BP 118/58 | HR 48 | Ht 66.0 in | Wt 153.0 lb

## 2012-06-09 DIAGNOSIS — R001 Bradycardia, unspecified: Secondary | ICD-10-CM

## 2012-06-09 DIAGNOSIS — I471 Supraventricular tachycardia: Secondary | ICD-10-CM

## 2012-06-09 DIAGNOSIS — I498 Other specified cardiac arrhythmias: Secondary | ICD-10-CM

## 2012-06-09 DIAGNOSIS — I10 Essential (primary) hypertension: Secondary | ICD-10-CM

## 2012-06-09 DIAGNOSIS — I2510 Atherosclerotic heart disease of native coronary artery without angina pectoris: Secondary | ICD-10-CM

## 2012-06-09 DIAGNOSIS — I219 Acute myocardial infarction, unspecified: Secondary | ICD-10-CM

## 2012-06-09 NOTE — Patient Instructions (Signed)
Your physician has recommended that you wear a 24 HOUR holter monitor DX BRADYCARDIA. Holter monitors are medical devices that record the heart's electrical activity. Doctors most often use these monitors to diagnose arrhythmias. Arrhythmias are problems with the speed or rhythm of the heartbeat. The monitor is a small, portable device. You can wear one while you do your normal daily activities. This is usually used to diagnose what is causing palpitations/syncope (passing out).    Your physician recommends that you schedule a follow-up appointment in: 3 MONTHS WITH DR. HOCHREIN

## 2012-06-09 NOTE — Progress Notes (Signed)
[address] [place], [place]  [address] Phone: [phone] Fax:  [phone]  Date:  06/09/2012   Name:  [name]   DOB:  September 29, 1920   MRN:  [id number]  PCP:  Kimber Relic, MD, MD  Primary Cardiologist:  Dr. Rollene Rotunda  Primary Electrophysiologist:  None    History of Present Illness: [name] is a 78 y.o. female who returns for f/u after a trip to the ED.    She has a history of SVT noted in 03/2009.  She ruled in for NSTEMI at that time.  Cardiac catheterization demonstrated distal LAD occlusion, circumflex distal 60%, OM2 30%, EF 60%.  Other history includes hypertension, dyslipidemia and prior GI bleeding.  Last seen by Dr. Antoine Poche 12/2011.  She was taken to the emergency room 05/25/12 with complaints of rapid palpitations and hypotension.  ECG demonstrated sinus rhythm by the time she arrived.  Vital signs were stable.  She had her metoprolol reduced at [facility] due to bradycardia.  He was apparently placed back on her typical dose in the emergency room.  She denies dizziness, lightheadedness, syncope, near syncope.  She denies worsening balance.  She denies chest discomfort or significant shortness of breath.  She denies orthopnea or PND.  She has mild, stable pedal edema.  Wt Readings from Last 3 Encounters:  06/09/12 153 lb (69.4 kg)  01/21/12 155 lb (70.308 kg)  09/23/11 159 lb (72.122 kg)     Potassium  Date/Time Value Range Status  05/25/2012  7:42 PM 5.1  3.5-5.1 (mEq/L) Final     Creatinine, Ser  Date/Time Value Range Status  05/25/2012  7:42 PM 1.00  0.50-1.10 (mg/dL) Final     ALT  Date/Time Value Range Status  03/04/2012 10:58 AM 17  0-35 (U/L) Final     TSH  Date/Time Value Range Status  03/20/2010  6:58 AM 1.699  0.350-4.500 (uIU/mL) Final   Hemoglobin  Date/Time Value Range Status  05/25/2012  7:42 PM 15.0  12.0-15.0 (g/dL) Final    Past Medical History  Diagnosis Date  . SVT (supraventricular  tachycardia)   . HTN (hypertension)   . Dyslipidemia   . CAD (coronary artery disease)     cath 4/10. distal LAD 100%, circ distal 60%, OM2 30%   . GI bleeding   . MI (myocardial infarction)     Current Outpatient Prescriptions  Medication Sig Dispense Refill  . amLODipine-benazepril (LOTREL) 5-10 MG per capsule Take 1 capsule by mouth daily.        Marland Kitchen aspirin 81 MG tablet Take 81 mg by mouth every evening.       Marland Kitchen atorvastatin (LIPITOR) 20 MG tablet Take 20 mg by mouth daily.        . brimonidine (ALPHAGAN) 0.15 % ophthalmic solution Place 1 drop into both eyes every morning. UAD      . diphenhydrAMINE (BENADRYL) 25 MG tablet Take 25 mg by mouth at bedtime as needed. As needed for sleep      . ezetimibe (ZETIA) 10 MG tablet Take 10 mg by mouth daily.        . fish oil-omega-3 fatty acids 1000 MG capsule Take 2 g by mouth daily.        Marland Kitchen HYDROcodone-acetaminophen (NORCO) 5-325 MG per tablet Take 1 tablet by mouth every 6 (six) hours as needed. pain      . isosorbide mononitrate (IMDUR) 60 MG 24 hr tablet Take 60 mg by mouth daily.        Marland Kitchen  latanoprost (XALATAN) 0.005 % ophthalmic solution Place 1 drop into both eyes every evening. UAD      . levothyroxine (SYNTHROID, LEVOTHROID) 25 MCG tablet Take 25 mcg by mouth daily.      . metoprolol tartrate (LOPRESSOR) 25 MG tablet Take 12.5 mg by mouth 2 (two) times daily.      . Multiple Vitamin (MULTIVITAMIN) tablet Take 1 tablet by mouth daily.        . Multiple Vitamins-Minerals (ICAPS AREDS FORMULA PO) Take 1 tablet by mouth daily.      . pantoprazole (PROTONIX) 40 MG tablet Take 40 mg by mouth daily.          Allergies: No Known Allergies  History  Substance Use Topics  . Smoking status: Never Smoker   . Smokeless tobacco: Never Used   Comment: denies smoking cigarettes  . Alcohol Use: Yes     drinks a scotch or wine every other day      ROS:  Please see the history of present illness.    All other systems reviewed and negative.    PHYSICAL EXAM: VS:  BP 118/58  Pulse 48  Ht 5\' 6"  (1.676 m)  Wt 153 lb (69.4 kg)  BMI 24.69 kg/m2 Well nourished, well developed, Elderly female in no acute distress HEENT: normal Neck: no JVD Cardiac:  normal S1, S2; RRR; no murmur Lungs:  Decreased breath sounds bilaterally, no wheezing, rhonchi or rales Abd: soft, nontender, no hepatomegaly Ext: Trace to 1+ bilateral LE edema Skin: warm and dry Neuro:  CNs 2-12 intact, no focal abnormalities noted  EKG:  Sinus bradycardia, heart rate 48, left axis deviation, poor progression, Interventricular conduction delay, no significant change since prior tracing   ASSESSMENT AND PLAN:  1.  Bradycardia I think she is tolerating this ok. Would not change her beta blocker at this point. Will arrange 24 hr holter.  If significant drops in HR or pauses, will need to adjust beta blocker.  2.  H/o SVT Arrange holter. With advanced age and what appears to be some baseline dementia, not sure she would be able to complete event monitor x 21 days. Continue beta blocker.  3.  Coronary Artery Disease Stable.  No angina.  Continue ASA and statin. Follow up with Dr. Rollene Rotunda in 3 mos.  4.  Hypertension Controlled.  Continue current therapy.   Signed, Tereso Newcomer, PA-C  1:00 PM 06/09/2012

## 2012-06-17 ENCOUNTER — Encounter (INDEPENDENT_AMBULATORY_CARE_PROVIDER_SITE_OTHER): Payer: Medicare Other

## 2012-06-17 DIAGNOSIS — I498 Other specified cardiac arrhythmias: Secondary | ICD-10-CM

## 2012-07-30 ENCOUNTER — Telehealth: Payer: Self-pay | Admitting: *Deleted

## 2012-07-30 NOTE — Telephone Encounter (Signed)
Fu call °Pt returning your call  °

## 2012-07-30 NOTE — Telephone Encounter (Signed)
PT  AWARE OF  MONITOR RESULTS ./CY 

## 2012-07-30 NOTE — Telephone Encounter (Signed)
ATTEMPTED TO GIVE MONITOR RESULTS  PER DR HOCHREIN  NSR WITH OCC VENT  ATRIAL  ECTOPY  SEE  MONITOR SUMMARY REPORT .Zack Seal

## 2012-08-03 ENCOUNTER — Other Ambulatory Visit: Payer: Self-pay | Admitting: Cardiology

## 2012-09-08 ENCOUNTER — Encounter: Payer: Self-pay | Admitting: Cardiology

## 2012-09-08 ENCOUNTER — Ambulatory Visit (INDEPENDENT_AMBULATORY_CARE_PROVIDER_SITE_OTHER): Payer: Medicare Other | Admitting: Cardiology

## 2012-09-08 VITALS — BP 142/74 | HR 59 | Ht 64.0 in | Wt 152.8 lb

## 2012-09-08 DIAGNOSIS — I10 Essential (primary) hypertension: Secondary | ICD-10-CM

## 2012-09-08 DIAGNOSIS — I471 Supraventricular tachycardia: Secondary | ICD-10-CM

## 2012-09-08 DIAGNOSIS — I219 Acute myocardial infarction, unspecified: Secondary | ICD-10-CM

## 2012-09-08 DIAGNOSIS — E785 Hyperlipidemia, unspecified: Secondary | ICD-10-CM

## 2012-09-08 DIAGNOSIS — I2510 Atherosclerotic heart disease of native coronary artery without angina pectoris: Secondary | ICD-10-CM

## 2012-09-08 NOTE — Patient Instructions (Addendum)
The current medical regimen is effective;  continue present plan and medications.  Follow up in 1 year with Dr Hochrein.  You will receive a letter in the mail 2 months before you are due.  Please call us when you receive this letter to schedule your follow up appointment.  

## 2012-09-08 NOTE — Progress Notes (Signed)
HPI The patient presents for follow up of SVT and CAD.  Since I last saw her she has done well. The patient denies any new symptoms such as chest discomfort, neck or arm discomfort. There has been no new shortness of breath, PND or orthopnea. There have been no reported palpitations, presyncope or syncope.  She did have one episode of her blood pressure being low when it was taken at church. She does occasionally get some very mild very fleeting lightheadedness but this passes quickly.  No Known Allergies  Current Outpatient Prescriptions  Medication Sig Dispense Refill  . amLODipine-benazepril (LOTREL) 5-10 MG per capsule Take 1 capsule by mouth daily.        Marland Kitchen aspirin 81 MG tablet Take 81 mg by mouth every evening.       Marland Kitchen atorvastatin (LIPITOR) 20 MG tablet Take 20 mg by mouth daily.        . brimonidine (ALPHAGAN) 0.15 % ophthalmic solution Place 1 drop into both eyes every morning. UAD      . diphenhydrAMINE (BENADRYL) 25 MG tablet Take 25 mg by mouth at bedtime as needed. As needed for sleep      . ezetimibe (ZETIA) 10 MG tablet Take 10 mg by mouth daily.        . fish oil-omega-3 fatty acids 1000 MG capsule Take 2 g by mouth daily.        Marland Kitchen HYDROcodone-acetaminophen (NORCO) 5-325 MG per tablet Take 1 tablet by mouth every 6 (six) hours as needed. pain      . isosorbide mononitrate (IMDUR) 60 MG 24 hr tablet Take 60 mg by mouth daily.        Marland Kitchen latanoprost (XALATAN) 0.005 % ophthalmic solution Place 1 drop into both eyes every evening. UAD      . levothyroxine (SYNTHROID, LEVOTHROID) 25 MCG tablet Take 25 mcg by mouth daily.      . metoprolol tartrate (LOPRESSOR) 25 MG tablet Take 12.5 mg by mouth 2 (two) times daily.      . Multiple Vitamin (MULTIVITAMIN) tablet Take 1 tablet by mouth daily.        . Multiple Vitamins-Minerals (ICAPS AREDS FORMULA PO) Take 1 tablet by mouth daily.      . pantoprazole (PROTONIX) 40 MG tablet Take 40 mg by mouth daily.        Marland Kitchen DISCONTD: metoprolol  tartrate (LOPRESSOR) 25 MG tablet TAKE ONE-HALF TABLET TWICE DAILY.  30 tablet  10    Past Medical History  Diagnosis Date  . SVT (supraventricular tachycardia)   . HTN (hypertension)   . Dyslipidemia   . CAD (coronary artery disease)     cath 4/10. distal LAD 100%, circ distal 60%, OM2 30%   . GI bleeding   . MI (myocardial infarction)     Past Surgical History  Procedure Date  . Hip surgery     ROS: As stated in the HPI and negative for all other systems.  PHYSICAL EXAM BP 142/74  Pulse 59  Ht 5\' 4"  (1.626 m)  Wt 152 lb 12.8 oz (69.31 kg)  BMI 26.23 kg/m2 GENERAL:  Well appearing for her age HEENT:  Pupils equal round and reactive, fundi not visualized, oral mucosa unremarkable NECK:  No jugular venous distention, waveform within normal limits, carotid upstroke brisk and symmetric, no bruits, no thyromegaly LYMPHATICS:  No cervical, inguinal adenopathy LUNGS:  Clear to auscultation bilaterally BACK:  No CVA tenderness CHEST:  Unremarkable HEART:  PMI not displaced or  sustained,S1 and S2 within normal limits, no S3, no S4, no clicks, no rubs,  Murmur left upper sternal border ABD:  Flat, positive bowel sounds normal in frequency in pitch, no bruits, no rebound, no guarding, no midline pulsatile mass, no hepatomegaly, no splenomegaly EXT:  2 plus pulses throughout, right greater than left edema, no cyanosis no clubbing, chronic venous stasis changes.   ASSESSMENT AND PLAN  PSVT -  She is having no paroxysms of this. No change in therapy is indicated.  MI -  The patient has no new sypmtoms.  No further cardiovascular testing is indicated.  We will continue with aggressive risk reduction and meds as listed.  HYPERTENSION -  Despite the low blood pressure recorded her blood pressures typically in the 1:30 to 140 systolic range. I reviewed her blood pressure diary. This point no change in therapy is indicated.

## 2013-01-16 ENCOUNTER — Emergency Department (HOSPITAL_COMMUNITY): Payer: Medicare Other

## 2013-01-16 ENCOUNTER — Encounter (HOSPITAL_COMMUNITY): Payer: Self-pay | Admitting: *Deleted

## 2013-01-16 ENCOUNTER — Emergency Department (HOSPITAL_COMMUNITY)
Admission: EM | Admit: 2013-01-16 | Discharge: 2013-01-16 | Disposition: A | Discharge status: Home / Self Care | Payer: Medicare Other | Attending: Emergency Medicine | Admitting: Emergency Medicine

## 2013-01-16 ENCOUNTER — Other Ambulatory Visit: Payer: Self-pay

## 2013-01-16 DIAGNOSIS — Z8719 Personal history of other diseases of the digestive system: Secondary | ICD-10-CM | POA: Insufficient documentation

## 2013-01-16 DIAGNOSIS — I471 Supraventricular tachycardia, unspecified: Secondary | ICD-10-CM | POA: Insufficient documentation

## 2013-01-16 DIAGNOSIS — I10 Essential (primary) hypertension: Secondary | ICD-10-CM | POA: Insufficient documentation

## 2013-01-16 DIAGNOSIS — I252 Old myocardial infarction: Secondary | ICD-10-CM | POA: Insufficient documentation

## 2013-01-16 DIAGNOSIS — I2510 Atherosclerotic heart disease of native coronary artery without angina pectoris: Secondary | ICD-10-CM | POA: Insufficient documentation

## 2013-01-16 DIAGNOSIS — Z7982 Long term (current) use of aspirin: Secondary | ICD-10-CM | POA: Insufficient documentation

## 2013-01-16 DIAGNOSIS — Z8639 Personal history of other endocrine, nutritional and metabolic disease: Secondary | ICD-10-CM | POA: Insufficient documentation

## 2013-01-16 DIAGNOSIS — Z862 Personal history of diseases of the blood and blood-forming organs and certain disorders involving the immune mechanism: Secondary | ICD-10-CM | POA: Insufficient documentation

## 2013-01-16 DIAGNOSIS — Z79899 Other long term (current) drug therapy: Secondary | ICD-10-CM | POA: Insufficient documentation

## 2013-01-16 LAB — CBC
MCH: 34.4 pg — ABNORMAL HIGH (ref 26.0–34.0)
MCHC: 34.4 g/dL (ref 30.0–36.0)
MCV: 100.2 fL — ABNORMAL HIGH (ref 78.0–100.0)
Platelets: 184 10*3/uL (ref 150–400)
RBC: 5.17 MIL/uL — ABNORMAL HIGH (ref 3.87–5.11)
RDW: 13.3 % (ref 11.5–15.5)

## 2013-01-16 LAB — POCT I-STAT, CHEM 8
BUN: 19 mg/dL (ref 6–23)
Calcium, Ion: 1.19 mmol/L (ref 1.13–1.30)
HCT: 54 % — ABNORMAL HIGH (ref 36.0–46.0)
Hemoglobin: 18.4 g/dL — ABNORMAL HIGH (ref 12.0–15.0)
Sodium: 143 mEq/L (ref 135–145)
TCO2: 28 mmol/L (ref 0–100)

## 2013-01-16 LAB — POCT I-STAT TROPONIN I: Troponin i, poc: 0.07 ng/mL (ref 0.00–0.08)

## 2013-01-16 MED ORDER — METOPROLOL TARTRATE 1 MG/ML IV SOLN
5.0000 mg | Freq: Once | INTRAVENOUS | Status: AC
  Administered 2013-01-16: 5 mg via INTRAVENOUS
  Filled 2013-01-16: qty 5

## 2013-01-16 NOTE — ED Notes (Signed)
Pt just converted from svt to hr of 80,s.  Pt states she feels much better.

## 2013-01-16 NOTE — ED Provider Notes (Signed)
Patient was signed out to me a followup on her second troponin. The plan that was determined in conjunction with cardiology was to monitor her for any further arrhythmia and perform a second troponin at 4 hours if she remains in sinus rhythm. Patient has remained in sinus rhythm around 70-75 beats per minute. Second troponin was 0.07. This is a slight bump but is still within the normal range. This would be expected for her significant tachycardia earlier but does not indicate any evidence of MRI. It was felt that the patient can be discharged home if she is feeling fine without any further symptoms. Repeat evaluation reveals that she is resting comfortably. She was counseled that she needs to start taking her Toprol as prescribed and follow up with her doctor. Return if she has any recurrent symptoms.  Gilda Crease, MD 01/16/13 [phone]

## 2013-01-16 NOTE — ED Notes (Signed)
Blood drawn by Donnamae Jude RN via IV start.

## 2013-01-16 NOTE — ED Provider Notes (Signed)
History     CSN: [id number]  Arrival date & time 01/16/13  1259   First MD Initiated Contact with Patient 01/16/13 1338      Chief Complaint  Patient presents with  . Weakness     HPI Pt lives at [facility] and was sent here from the [facility] for a weakness and HR of 160. HR in 160's presently.  Patient has had this happen once before when she's forgot to take her metoprolol. PT's son states pt did not take any medications last night or this am and pt forgot to take them.  Patient states she now feels much better and almost back to baseline.  Patient denies chest pain.  Corn to the sun she did have nausea and vomiting.  Past Medical History  Diagnosis Date  . SVT (supraventricular tachycardia)   . HTN (hypertension)   . Dyslipidemia   . CAD (coronary artery disease)     cath 4/10. distal LAD 100%, circ distal 60%, OM2 30%   . GI bleeding   . MI (myocardial infarction)     Past Surgical History  Procedure Date  . Hip surgery     Family History  Problem Relation Age of Onset  . Stroke Neg Hx   . Heart attack Neg Hx     History  Substance Use Topics  . Smoking status: Never Smoker   . Smokeless tobacco: Never Used     Comment: denies smoking cigarettes  . Alcohol Use: Yes     Comment: drinks a scotch or wine every other day     OB History    Grav Para Term Preterm Abortions TAB SAB Ect Mult Living                  Review of Systems All other systems reviewed and are negative Allergies  Review of patient's allergies indicates no known allergies.  Home Medications   Current Outpatient Rx  Name  Route  Sig  Dispense  Refill  . AMLODIPINE BESY-BENAZEPRIL HCL 5-10 MG PO CAPS   Oral   Take 1 capsule by mouth daily.           . ASPIRIN EC 81 MG PO TBEC   Oral   Take 81 mg by mouth every evening.         . ATORVASTATIN CALCIUM 20 MG PO TABS   Oral   Take 20 mg by mouth daily.           Marland Kitchen BRIMONIDINE TARTRATE 0.2 % OP SOLN   Both Eyes  Place 1 drop into both eyes 2 (two) times daily.         Marland Kitchen EZETIMIBE 10 MG PO TABS   Oral   Take 10 mg by mouth daily.           . ISOSORBIDE MONONITRATE ER 60 MG PO TB24   Oral   Take 60 mg by mouth every evening.          Marland Kitchen LATANOPROST 0.005 % OP SOLN   Both Eyes   Place 1 drop into both eyes every evening. UAD         . LEVOTHYROXINE SODIUM 25 MCG PO TABS   Oral   Take 25 mcg by mouth daily.         Marland Kitchen METOPROLOL TARTRATE 25 MG PO TABS   Oral   Take 12.5 mg by mouth 2 (two) times daily.         Marland Kitchen  ADULT MULTIVITAMIN W/MINERALS CH   Oral   Take 1 tablet by mouth daily. Certavite-A         . ICAPS AREDS FORMULA PO   Oral   Take 1 tablet by mouth daily.         Marland Kitchen FISH OIL 1200 MG PO CAPS   Oral   Take 1,200 mg by mouth daily.         Marland Kitchen OVER THE COUNTER MEDICATION   Oral   Take 1 tablet by mouth at bedtime. Sleep ease from [employer]         . PANTOPRAZOLE SODIUM 40 MG PO TBEC   Oral   Take 40 mg by mouth daily.             BP 179/75  Pulse 60  Resp 12  Ht 5\' 6"  (1.676 m)  Wt 150 lb (68.04 kg)  BMI 24.21 kg/m2  SpO2 100%  Physical Exam  Nursing note and vitals reviewed. Constitutional: She is oriented to person, place, and time. She appears well-developed and well-nourished. No distress.  HENT:  Head: Normocephalic and atraumatic.  Eyes: Pupils are equal, round, and reactive to light.  Neck: Normal range of motion.  Cardiovascular: Normal rate and intact distal pulses.  Exam reveals no gallop and no friction rub.   Pulmonary/Chest: No respiratory distress. She has no wheezes. She has no rales.  Abdominal: Normal appearance. She exhibits no distension. There is no rebound.  Musculoskeletal: Normal range of motion.  Neurological: She is alert and oriented to person, place, and time. No cranial nerve deficit.  Skin: Skin is warm and dry. No rash noted.  Psychiatric: She has a normal mood and affect. Her behavior is normal.    ED  Course  Procedures   Initial EKG that was done showed SVT with rate of 163 that spontaneously converted to sinus rhythm see below EKG.       (including critical care time)  Date: 01/16/2013  Rate: 92  Rhythm: normal sinus rhythm  QRS Axis: normal  Intervals: normal  ST/T Wave abnormalities: Inferior T wave inversion in the inferior and lateral leads.  Inferior T wave inversion is new lateral inversion is old.  Conduction Disutrbances: none  Narrative Interpretation: Abnormal EKG     Labs Reviewed  CBC - Abnormal; Notable for the following:    RBC 5.17 (*)     Hemoglobin 17.8 (*)     HCT 51.8 (*)     MCV 100.2 (*)     MCH 34.4 (*)     All other components within normal limits  POCT I-STAT, CHEM 8 - Abnormal; Notable for the following:    Creatinine, Ser 1.30 (*)     Glucose, Bld 165 (*)     Hemoglobin 18.4 (*)     HCT 54.0 (*)     All other components within normal limits  POCT I-STAT TROPONIN I  POCT I-STAT TROPONIN I   Dg Chest Portable 1 View  01/16/2013  *RADIOLOGY REPORT*  Clinical Data: Weakness, shortness of breath  PORTABLE CHEST - 1 VIEW  Comparison: 03/04/2012  Findings: Grossly unchanged enlarged cardiac silhouette. Atherosclerotic calcifications within the thoracic aorta. The lungs remain hyperexpanded with mild diffuse thickening of the pulmonary interstitium.  Minimal bibasilar opacities favored to represent atelectasis.  No focal airspace opacities.  No definite pleural effusion or pneumothorax.  Unchanged bones including marked degenerative change of the bilateral glenohumeral joints, right greater than left.  IMPRESSION: Hyperexpanded lungs  without definite acute cardiopulmonary disease on this AP portable exam.Further evaluation with a PA and lateral chest radiograph may be obtained as clinically indicated.   Original Report Authenticated By: [name], MD      1. PSVT       MDM   I spoke with Dr. Tresa Endo with cardiology who. reviewed the EKGs.   He states the postconversion EKG looks very similar to her old EKG.  Our plan at this time is to get his second troponin in 4 hours and if it remains negative she can go back home as long as she hasn't had anymore episodes of SVT.       Nelia Shi, MD 01/16/13 [phone]

## 2013-01-16 NOTE — ED Notes (Signed)
Pt lives at [place] and was sent here from the [facility] for a weakness and HR of 160.  HR in 160's presently.  PT's son states pt did not take any medications last night or this am and pt forgot to take them.

## 2013-01-16 NOTE — ED Notes (Signed)
This am pt was feeling weak and faint, did not lose consciousness. Pt states she is feeling better, denies weakness presently. Denies CP.

## 2013-01-19 ENCOUNTER — Ambulatory Visit (INDEPENDENT_AMBULATORY_CARE_PROVIDER_SITE_OTHER): Payer: Medicare Other | Admitting: Cardiology

## 2013-01-19 ENCOUNTER — Encounter: Payer: Self-pay | Admitting: Cardiology

## 2013-01-19 VITALS — BP 120/72 | HR 86 | Ht 64.0 in | Wt 155.0 lb

## 2013-01-19 DIAGNOSIS — I2510 Atherosclerotic heart disease of native coronary artery without angina pectoris: Secondary | ICD-10-CM

## 2013-01-19 DIAGNOSIS — I471 Supraventricular tachycardia, unspecified: Secondary | ICD-10-CM

## 2013-01-19 DIAGNOSIS — I10 Essential (primary) hypertension: Secondary | ICD-10-CM

## 2013-01-19 DIAGNOSIS — E785 Hyperlipidemia, unspecified: Secondary | ICD-10-CM

## 2013-01-19 DIAGNOSIS — I219 Acute myocardial infarction, unspecified: Secondary | ICD-10-CM

## 2013-01-19 MED ORDER — METOPROLOL TARTRATE 25 MG PO TABS: 25.0000 mg | ORAL_TABLET | Freq: Two times a day (BID) | ORAL | Status: DC

## 2013-01-19 MED ORDER — AMLODIPINE BESY-BENAZEPRIL HCL 2.5-10 MG PO CAPS: 1.0000 | ORAL_CAPSULE | Freq: Every day | ORAL | Status: DC

## 2013-01-19 MED ORDER — ADENOSINE 6 MG/2ML IV SOLN
6.0000 mg | Freq: Once | INTRAVENOUS | Status: AC
  Administered 2013-01-19: 6 mg via INTRAVENOUS

## 2013-01-19 NOTE — Progress Notes (Signed)
HPI The patient presents for evaluation of supraventricular tach. Last Friday she didn't feel well. Friday evening she did not take her metoprolol. Saturday morning she did not take her metoprolol. She went to a funeral. She had some dizziness and mild altered mental status. She ultimately was evaluated [facility] and found to have a heart rate in the 160s. She went to the emergency room and I reviewed these records. She initially was in SVT with a rate of 163. She was hypertensive at that time. She actually broke spontaneously. Labs were otherwise unremarkable and she was not admitted to the hospital.  She presents today for followup. She actually says she's feeling relatively well. She might have been a little weak this morning in retrospect. Interestingly she is back in her supraventricular tachycardia with a rate of 145.  She does not report however any sense of presyncope or syncope. She is not having any chest pressure, neck or arm discomfort. She's not having any PND or orthopnea. She otherwise has been in her usual state of health.  No Known Allergies  Current Outpatient Prescriptions  Medication Sig Dispense Refill  . amLODipine-benazepril (LOTREL) 5-10 MG per capsule Take 1 capsule by mouth daily.        Marland Kitchen aspirin EC 81 MG tablet Take 81 mg by mouth every evening.      Marland Kitchen atorvastatin (LIPITOR) 20 MG tablet Take 20 mg by mouth daily.        . brimonidine (ALPHAGAN) 0.2 % ophthalmic solution Place 1 drop into both eyes 2 (two) times daily.      Marland Kitchen ezetimibe (ZETIA) 10 MG tablet Take 10 mg by mouth daily.        . isosorbide mononitrate (IMDUR) 60 MG 24 hr tablet Take 60 mg by mouth every evening.       . latanoprost (XALATAN) 0.005 % ophthalmic solution Place 1 drop into both eyes every evening. UAD      . levothyroxine (SYNTHROID, LEVOTHROID) 25 MCG tablet Take 25 mcg by mouth daily.      . metoprolol tartrate (LOPRESSOR) 25 MG tablet Take 12.5 mg by mouth 2  (two) times daily.      . Multiple Vitamin (MULTIVITAMIN WITH MINERALS) TABS Take 1 tablet by mouth daily. Certavite-A      . Multiple Vitamins-Minerals (ICAPS AREDS FORMULA PO) Take 1 tablet by mouth daily.      . Omega-3 Fatty Acids (FISH OIL) 1200 MG CAPS Take 1,200 mg by mouth daily.      Marland Kitchen OVER THE COUNTER MEDICATION Take 1 tablet by mouth at bedtime. Sleep ease from [employer]      . pantoprazole (PROTONIX) 40 MG tablet Take 40 mg by mouth daily.          Past Medical History  Diagnosis Date  . SVT (supraventricular tachycardia)   . HTN (hypertension)   . Dyslipidemia   . CAD (coronary artery disease)     cath 4/10. distal LAD 100%, circ distal 60%, OM2 30%   . GI bleeding   . MI (myocardial infarction)     Past Surgical History  Procedure Date  . Hip surgery     Family History  Problem Relation Age of Onset  . Stroke Neg Hx   . Heart attack Neg Hx       ROS:  As stated in the HPI and negative for all other systems.   PHYSICAL EXAM BP 120/72  Pulse 86  Ht 5\' 4"  (1.626 m)  Wt 155 lb (70.308 kg)  BMI 26.61 kg/m2 GENERAL:  Well appearing HEENT:  Pupils equal round and reactive, fundi not visualized, oral mucosa unremarkable NECK:  No jugular venous distention, waveform within normal limits, carotid upstroke brisk and symmetric, no bruits, no thyromegaly LYMPHATICS:  No cervical, inguinal adenopathy LUNGS:  Clear to auscultation bilaterally BACK:  No CVA tenderness CHEST:  Unremarkable HEART:  PMI not displaced or sustained,S1 and S2 within normal limits, no S3, no S4, no clicks, no rubs, no murmurs ABD:  Flat, positive bowel sounds normal in frequency in pitch, no bruits, no rebound, no guarding, no midline pulsatile mass, no hepatomegaly, no splenomegaly EXT:  2 plus pulses throughout, no edema, no cyanosis no clubbing SKIN:  No rashes no nodules NEURO:  Cranial nerves II through XII grossly intact, motor grossly intact throughout PSYCH:  Cognitively intact,  oriented to person place and time   EKG:  Supraventricular tachycardia left bundle branch block incomplete, left axis deviation. Rate 145. 01/19/2013  ASSESSMENT AND PLAN  PSVT -  The patient is back in her SVT. I wanted to put her in the hospital for management of this given her advanced age and the fact it is recurrent. She absolutely did not want this. She did not break with repeated vagal maneuvers. Therefore, she was treated with adenosine 6 mg. She converted to sinus bradycardia with a rate of 57. She feels well.  (Greater than 60 minutes reviewing all data with greater than 50% face to face with the patient).  My plan is to try to increase her beta blocker dose to 25 mg twice a day. I will otherwise adjust her medicines as below.  In retrospect further talking to the patient she thinks she is having more of these episodes then she has admitted to.ient has no new sypmtoms. No further cardiovascular testing is indicated. We will continue with aggressive risk reduction and meds as listed.   HYPERTENSION -  I reviewed her blood pressure diary it fluctuates somewhat but is relatively well controlled. I will reduce her amlodipine/benazepril to 2.5/10

## 2013-01-19 NOTE — Patient Instructions (Addendum)
Your physician recommends that you schedule a follow-up appointment in: ONE WEEK WITH DR HOCHREIN OR EXTENDER  INCREASE METOPROLOL TO 25 MG TWICE DAILY  DECREASE AMLODIPINE- BENAZAPRIL TO 2.5/10 MG ONCE DAILY

## 2013-01-27 ENCOUNTER — Ambulatory Visit: Payer: Medicare Other | Admitting: Cardiology

## 2013-01-27 ENCOUNTER — Ambulatory Visit (INDEPENDENT_AMBULATORY_CARE_PROVIDER_SITE_OTHER): Payer: Medicare Other | Admitting: Cardiology

## 2013-01-27 ENCOUNTER — Encounter: Payer: Self-pay | Admitting: Cardiology

## 2013-01-27 VITALS — BP 130/80 | HR 50 | Ht 65.0 in | Wt 158.4 lb

## 2013-01-27 DIAGNOSIS — I10 Essential (primary) hypertension: Secondary | ICD-10-CM

## 2013-01-27 DIAGNOSIS — E785 Hyperlipidemia, unspecified: Secondary | ICD-10-CM

## 2013-01-27 DIAGNOSIS — I219 Acute myocardial infarction, unspecified: Secondary | ICD-10-CM

## 2013-01-27 DIAGNOSIS — I2510 Atherosclerotic heart disease of native coronary artery without angina pectoris: Secondary | ICD-10-CM

## 2013-01-27 DIAGNOSIS — I471 Supraventricular tachycardia: Secondary | ICD-10-CM

## 2013-01-27 NOTE — Patient Instructions (Addendum)
The current medical regimen is effective;  continue present plan and medications.  Follow up with Dr Hochrein in 4 months. 

## 2013-01-27 NOTE — Progress Notes (Signed)
HPI The patient presents for follow up of supraventricular tach. She was here last week and had SVT and was treated in the office with adenosine.  She converted to NSR and I adjusted her meds with increased metoprolol.  Since then the patient has had no further symptoms suggestive of SVT. She's had none of the rapid heart rate are weak spells. She tolerated the medication. She's not having any lightheadedness, presyncope or syncope. She's not having any chest pressure, neck or arm discomfort.  No Known Allergies  Current Outpatient Prescriptions  Medication Sig Dispense Refill  . amlodipine-benazepril (LOTREL) 2.5-10 MG per capsule Take 1 capsule by mouth daily.  30 capsule  12  . aspirin EC 81 MG tablet Take 81 mg by mouth every evening.      Marland Kitchen atorvastatin (LIPITOR) 20 MG tablet Take 20 mg by mouth daily.        . brimonidine (ALPHAGAN) 0.2 % ophthalmic solution Place 1 drop into both eyes 2 (two) times daily.      Marland Kitchen ezetimibe (ZETIA) 10 MG tablet Take 10 mg by mouth daily.        . isosorbide mononitrate (IMDUR) 60 MG 24 hr tablet Take 60 mg by mouth every evening.       . latanoprost (XALATAN) 0.005 % ophthalmic solution Place 1 drop into both eyes every evening. UAD      . levothyroxine (SYNTHROID, LEVOTHROID) 25 MCG tablet Take 25 mcg by mouth daily.      . metoprolol tartrate (LOPRESSOR) 25 MG tablet Take 1 tablet (25 mg total) by mouth 2 (two) times daily.  60 tablet  12  . Multiple Vitamin (MULTIVITAMIN WITH MINERALS) TABS Take 1 tablet by mouth daily. Certavite-A      . Multiple Vitamins-Minerals (ICAPS AREDS FORMULA PO) Take 1 tablet by mouth daily.      . Omega-3 Fatty Acids (FISH OIL) 1200 MG CAPS Take 1,200 mg by mouth daily.      Marland Kitchen OVER THE COUNTER MEDICATION Take 1 tablet by mouth at bedtime. Sleep ease from [employer]      . pantoprazole (PROTONIX) 40 MG tablet Take 40 mg by mouth daily.          Past Medical History  Diagnosis Date  . SVT (supraventricular tachycardia)     . HTN (hypertension)   . Dyslipidemia   . CAD (coronary artery disease)     cath 4/10. distal LAD 100%, circ distal 60%, OM2 30%   . GI bleeding   . MI (myocardial infarction)     Past Surgical History  Procedure Date  . Hip surgery     Family History  Problem Relation Age of Onset  . Stroke Neg Hx   . Heart attack Neg Hx    ROS:  As stated in the HPI and negative for all other systems.  PHYSICAL EXAM BP 130/80  Pulse 50  Ht 5\' 5"  (1.651 m)  Wt 158 lb 6.4 oz (71.85 kg)  BMI 26.36 kg/m2 GENERAL:  Well appearing NECK:  No jugular venous distention, waveform within normal limits, carotid upstroke brisk and symmetric, no bruits, no thyromegaly LUNGS:  Clear to auscultation bilaterally CHEST:  Unremarkable HEART:  PMI not displaced or sustained,S1 and S2 within normal limits, no S3, no S4, no clicks, no rubs, no murmurs ABD:  Flat, positive bowel sounds normal in frequency in pitch, no bruits, no rebound, no guarding, no midline pulsatile mass, no hepatomegaly, no splenomegaly EXT:  2 plus  pulses throughout, no edema, no cyanosis no clubbing  ASSESSMENT AND PLAN  PSVT -  The patient will continue current regimen. We discussed possible ablation if this becomes a frequently recurrent event.  HYPERTENSION -  Her blood pressure is well controlled on current regimen. No change in therapy is indicated.

## 2013-02-04 ENCOUNTER — Encounter: Payer: Self-pay | Admitting: Cardiology

## 2013-02-22 ENCOUNTER — Encounter (HOSPITAL_COMMUNITY): Payer: Self-pay | Admitting: Emergency Medicine

## 2013-02-22 ENCOUNTER — Emergency Department (HOSPITAL_COMMUNITY)
Admission: EM | Admit: 2013-02-22 | Discharge: 2013-02-22 | Disposition: A | Discharge status: Home / Self Care | Payer: Medicare Other | Attending: Emergency Medicine | Admitting: Emergency Medicine

## 2013-02-22 ENCOUNTER — Emergency Department (HOSPITAL_COMMUNITY): Payer: Medicare Other

## 2013-02-22 DIAGNOSIS — Y939 Activity, unspecified: Secondary | ICD-10-CM | POA: Insufficient documentation

## 2013-02-22 DIAGNOSIS — I10 Essential (primary) hypertension: Secondary | ICD-10-CM | POA: Insufficient documentation

## 2013-02-22 DIAGNOSIS — I252 Old myocardial infarction: Secondary | ICD-10-CM | POA: Insufficient documentation

## 2013-02-22 DIAGNOSIS — Z8679 Personal history of other diseases of the circulatory system: Secondary | ICD-10-CM | POA: Insufficient documentation

## 2013-02-22 DIAGNOSIS — I2510 Atherosclerotic heart disease of native coronary artery without angina pectoris: Secondary | ICD-10-CM | POA: Insufficient documentation

## 2013-02-22 DIAGNOSIS — S79919A Unspecified injury of unspecified hip, initial encounter: Secondary | ICD-10-CM | POA: Insufficient documentation

## 2013-02-22 DIAGNOSIS — Y929 Unspecified place or not applicable: Secondary | ICD-10-CM | POA: Insufficient documentation

## 2013-02-22 DIAGNOSIS — Z96649 Presence of unspecified artificial hip joint: Secondary | ICD-10-CM | POA: Insufficient documentation

## 2013-02-22 DIAGNOSIS — E785 Hyperlipidemia, unspecified: Secondary | ICD-10-CM | POA: Insufficient documentation

## 2013-02-22 DIAGNOSIS — Z79899 Other long term (current) drug therapy: Secondary | ICD-10-CM | POA: Insufficient documentation

## 2013-02-22 DIAGNOSIS — Z7982 Long term (current) use of aspirin: Secondary | ICD-10-CM | POA: Insufficient documentation

## 2013-02-22 DIAGNOSIS — J159 Unspecified bacterial pneumonia: Secondary | ICD-10-CM | POA: Insufficient documentation

## 2013-02-22 DIAGNOSIS — Z8719 Personal history of other diseases of the digestive system: Secondary | ICD-10-CM | POA: Insufficient documentation

## 2013-02-22 DIAGNOSIS — R296 Repeated falls: Secondary | ICD-10-CM

## 2013-02-22 DIAGNOSIS — J189 Pneumonia, unspecified organism: Secondary | ICD-10-CM

## 2013-02-22 DIAGNOSIS — W010XXA Fall on same level from slipping, tripping and stumbling without subsequent striking against object, initial encounter: Secondary | ICD-10-CM | POA: Insufficient documentation

## 2013-02-22 LAB — COMPREHENSIVE METABOLIC PANEL
ALT: 16 U/L (ref 0–35)
AST: 26 U/L (ref 0–37)
Albumin: 3.2 g/dL — ABNORMAL LOW (ref 3.5–5.2)
Alkaline Phosphatase: 110 U/L (ref 39–117)
Chloride: 103 mEq/L (ref 96–112)
Potassium: 3.7 mEq/L (ref 3.5–5.1)
Sodium: 138 mEq/L (ref 135–145)
Total Bilirubin: 0.5 mg/dL (ref 0.3–1.2)
Total Protein: 7.1 g/dL (ref 6.0–8.3)

## 2013-02-22 LAB — CBC WITH DIFFERENTIAL/PLATELET
Basophils Absolute: 0 10*3/uL (ref 0.0–0.1)
Basophils Relative: 0 % (ref 0–1)
Hemoglobin: 14.4 g/dL (ref 12.0–15.0)
MCHC: 33.9 g/dL (ref 30.0–36.0)
Monocytes Relative: 13 % — ABNORMAL HIGH (ref 3–12)
Neutro Abs: 4.2 10*3/uL (ref 1.7–7.7)
Neutrophils Relative %: 72 % (ref 43–77)
Platelets: 156 10*3/uL (ref 150–400)
RDW: 13.4 % (ref 11.5–15.5)

## 2013-02-22 LAB — URINALYSIS, ROUTINE W REFLEX MICROSCOPIC
Glucose, UA: NEGATIVE mg/dL
Hgb urine dipstick: NEGATIVE
Ketones, ur: 15 mg/dL — AB
Protein, ur: NEGATIVE mg/dL
pH: 5.5 (ref 5.0–8.0)

## 2013-02-22 LAB — POCT I-STAT TROPONIN I: Troponin i, poc: 0.05 ng/mL (ref 0.00–0.08)

## 2013-02-22 LAB — SAMPLE TO BLOOD BANK

## 2013-02-22 MED ORDER — LEVOFLOXACIN 750 MG PO TABS: 750.0000 mg | ORAL_TABLET | Freq: Every day | ORAL | Status: DC

## 2013-02-22 MED ORDER — LEVOFLOXACIN IN D5W 750 MG/150ML IV SOLN
750.0000 mg | Freq: Once | INTRAVENOUS | Status: AC
  Administered 2013-02-22: 750 mg via INTRAVENOUS
  Filled 2013-02-22: qty 150

## 2013-02-22 MED ORDER — SODIUM CHLORIDE 0.9 % IV SOLN
INTRAVENOUS | Status: DC
  Administered 2013-02-22: 11:00:00 via INTRAVENOUS

## 2013-02-22 NOTE — ED Notes (Signed)
Received pt from [facility] with c/o generalized weakness onset last night. Pt fell last night and has contusion to right lumbar area.

## 2013-02-22 NOTE — ED Notes (Signed)
Pt undressed, in gown, on monitor, continuous pulse oximetry and blood pressure cuff 

## 2013-02-22 NOTE — ED Notes (Signed)
Discharge summary faxed to [facility] as requested.

## 2013-02-22 NOTE — ED Notes (Signed)
Pt is off the floor at this time.

## 2013-02-22 NOTE — ED Notes (Signed)
Pt back to room; phlebotomist at bedside; family at bedside; pt placed back on monitor, continuous pulse oximetry and blood pressure cuff

## 2013-02-22 NOTE — ED Provider Notes (Signed)
History     CSN: [id number]  Arrival date & time 02/22/13  0911   First MD Initiated Contact with Patient 02/22/13 [phone]      Chief Complaint  Patient presents with  . Weakness    (Consider location/radiation/quality/duration/timing/severity/associated sxs/prior treatment) HPI  Patient and son state patient tripped 2 nights ago and fell. She states she tripped on the edge of porcelain tile. Since then she's had pain in her hips states stating the right hurts more than the left. She's had a total hip replacement on the left. She states she's been ambulatory and she fell again last night and then again this morning. She does not normally use a walker or a cane. She stopped taking her medicine 2 evenings ago and yesterday and also this morning. Her son reports when they checked her at the independent living her heart was racing and they couldn't feel a blood pressure so EMS was called. She had a similar episode last month when she had a run of atrial fibrillation that converted to normal sinus rhythm when patient arrived to the ED. Son states she recently had her metoprolol increased to control the rapid heart rate.  PCP Dr. Frederik Pear Cardiologist Dr Antoine Poche Orhtopedist Dr Lequita Halt  Past Medical History  Diagnosis Date  . SVT (supraventricular tachycardia)   . HTN (hypertension)   . Dyslipidemia   . CAD (coronary artery disease)     cath 4/10. distal LAD 100%, circ distal 60%, OM2 30%   . GI bleeding   . MI (myocardial infarction)     Past Surgical History  Procedure Laterality Date  . Hip surgery      Family History  Problem Relation Age of Onset  . Stroke Neg Hx   . Heart attack Neg Hx     History  Substance Use Topics  . Smoking status: Never Smoker   . Smokeless tobacco: Never Used     Comment: denies smoking cigarettes  . Alcohol Use: Yes     Comment: drinks a scotch or wine every other day    Lives at independent living Lives with husband   OB History   Grav Para Term Preterm Abortions TAB SAB Ect Mult Living                  Review of Systems  All other systems reviewed and are negative.    Allergies  Review of patient's allergies indicates no known allergies.  Home Medications   Current Outpatient Rx  Name  Route  Sig  Dispense  Refill  . amlodipine-benazepril (LOTREL) 2.5-10 MG per capsule   Oral   Take 1 capsule by mouth daily.   30 capsule   12   . aspirin EC 81 MG tablet   Oral   Take 81 mg by mouth every evening.         Marland Kitchen atorvastatin (LIPITOR) 20 MG tablet   Oral   Take 20 mg by mouth daily.           . brimonidine (ALPHAGAN) 0.2 % ophthalmic solution   Both Eyes   Place 1 drop into both eyes 2 (two) times daily.         Marland Kitchen ezetimibe (ZETIA) 10 MG tablet   Oral   Take 10 mg by mouth daily.           . isosorbide mononitrate (IMDUR) 60 MG 24 hr tablet   Oral   Take 60 mg by mouth every evening.          Marland Kitchen  latanoprost (XALATAN) 0.005 % ophthalmic solution   Both Eyes   Place 1 drop into both eyes every evening. UAD         . levothyroxine (SYNTHROID, LEVOTHROID) 25 MCG tablet   Oral   Take 25 mcg by mouth daily.         . metoprolol tartrate (LOPRESSOR) 25 MG tablet   Oral   Take 1 tablet (25 mg total) by mouth 2 (two) times daily.   60 tablet   12   . Multiple Vitamin (MULTIVITAMIN WITH MINERALS) TABS   Oral   Take 1 tablet by mouth daily. Certavite-A         . Multiple Vitamins-Minerals (ICAPS AREDS FORMULA PO)   Oral   Take 1 tablet by mouth daily.         . Omega-3 Fatty Acids (FISH OIL) 1200 MG CAPS   Oral   Take 1,200 mg by mouth daily.         Marland Kitchen OVER THE COUNTER MEDICATION   Oral   Take 1 tablet by mouth at bedtime. Sleep ease from [employer]         . pantoprazole (PROTONIX) 40 MG tablet   Oral   Take 40 mg by mouth daily.             BP 169/69  Pulse 64  Temp(Src) 98 F (36.7 C) (Oral)  Resp 15  SpO2 95%  Vital signs normal    Physical Exam   Nursing note and vitals reviewed. Constitutional: She is oriented to person, place, and time. She appears well-developed and well-nourished.  Non-toxic appearance. She does not appear ill. No distress.  HENT:  Head: Normocephalic and atraumatic.  Right Ear: External ear normal.  Left Ear: External ear normal.  Nose: Nose normal. No mucosal edema or rhinorrhea.  Mouth/Throat: Oropharynx is clear and moist and mucous membranes are normal. No dental abscesses or edematous.  Eyes: Conjunctivae and EOM are normal. Pupils are equal, round, and reactive to light.  Neck: Normal range of motion and full passive range of motion without pain. Neck supple.  Cardiovascular: Normal rate, regular rhythm and normal heart sounds.  Exam reveals no gallop and no friction rub.   No murmur heard. Pulmonary/Chest: Effort normal and breath sounds normal. No respiratory distress. She has no wheezes. She has no rhonchi. She has no rales. She exhibits no tenderness and no crepitus.  Abdominal: Soft. Normal appearance and bowel sounds are normal. She exhibits no distension. There is no tenderness. There is no rebound and no guarding.  Musculoskeletal: Normal range of motion. She exhibits no edema and no tenderness.  Moves all extremities well. However she states she has pain in her right hip with flexion of her right hip and knee. She is noted to have some swelling in the subcutaneous tissue along her right hip. There is no swelling noted around the left hip.  Neurological: She is alert and oriented to person, place, and time. She has normal strength. No cranial nerve deficit.  Skin: Skin is warm, dry and intact. No rash noted. No erythema. No pallor.  Patient has a lot of diffuse redness of her skin and hyperpigmented areas with some hypopigmented areas consistent with extensive skin damage from sun exposure  Psychiatric: She has a normal mood and affect. Her speech is normal and behavior is normal. Her mood appears not  anxious.    ED Course  Procedures (including critical care time) Medications  0.9 %  sodium chloride infusion ( Intravenous New Bag/Given 02/22/13 1050)  levofloxacin (LEVAQUIN) IVPB 750 mg (750 mg Intravenous New Bag/Given 02/22/13 1220)   Have discussed results of her x-rays patient's son. Son states they have a area at her independent living that she can be monitored closely. He preferred that she could bear instead of being admitted to the hospital. He also states he will drive her from the emergency room to that facility. We are currently waiting on her urinalysis results.  Nurse states patient is refusing to have her head CT done.  Pulse ox while walking was 96%  Results for orders placed during the hospital encounter of 02/22/13  CBC WITH DIFFERENTIAL      Result Value Range   WBC 5.8  4.0 - 10.5 K/uL   RBC 4.33  3.87 - 5.11 MIL/uL   Hemoglobin 14.4  12.0 - 15.0 g/dL   HCT 04.5  40.9 - 81.1 %   MCV 98.2  78.0 - 100.0 fL   MCH 33.3  26.0 - 34.0 pg   MCHC 33.9  30.0 - 36.0 g/dL   RDW 91.4  78.2 - 95.6 %   Platelets 156  150 - 400 K/uL   Neutrophils Relative 72  43 - 77 %   Neutro Abs 4.2  1.7 - 7.7 K/uL   Lymphocytes Relative 15  12 - 46 %   Lymphs Abs 0.9  0.7 - 4.0 K/uL   Monocytes Relative 13 (*) 3 - 12 %   Monocytes Absolute 0.8  0.1 - 1.0 K/uL   Eosinophils Relative 0  0 - 5 %   Eosinophils Absolute 0.0  0.0 - 0.7 K/uL   Basophils Relative 0  0 - 1 %   Basophils Absolute 0.0  0.0 - 0.1 K/uL  COMPREHENSIVE METABOLIC PANEL      Result Value Range   Sodium 138  135 - 145 mEq/L   Potassium 3.7  3.5 - 5.1 mEq/L   Chloride 103  96 - 112 mEq/L   CO2 27  19 - 32 mEq/L   Glucose, Bld 109 (*) 70 - 99 mg/dL   BUN 15  6 - 23 mg/dL   Creatinine, Ser 2.13  0.50 - 1.10 mg/dL   Calcium 9.1  8.4 - 08.6 mg/dL   Total Protein 7.1  6.0 - 8.3 g/dL   Albumin 3.2 (*) 3.5 - 5.2 g/dL   AST 26  0 - 37 U/L   ALT 16  0 - 35 U/L   Alkaline Phosphatase 110  39 - 117 U/L   Total  Bilirubin 0.5  0.3 - 1.2 mg/dL   GFR calc non Af Amer 72 (*) >90 mL/min   GFR calc Af Amer 83 (*) >90 mL/min  URINALYSIS, ROUTINE W REFLEX MICROSCOPIC      Result Value Range   Color, Urine YELLOW  YELLOW   APPearance CLEAR  CLEAR   Specific Gravity, Urine 1.021  1.005 - 1.030   pH 5.5  5.0 - 8.0   Glucose, UA NEGATIVE  NEGATIVE mg/dL   Hgb urine dipstick NEGATIVE  NEGATIVE   Bilirubin Urine NEGATIVE  NEGATIVE   Ketones, ur 15 (*) NEGATIVE mg/dL   Protein, ur NEGATIVE  NEGATIVE mg/dL   Urobilinogen, UA 0.2  0.0 - 1.0 mg/dL   Nitrite NEGATIVE  NEGATIVE   Leukocytes, UA NEGATIVE  NEGATIVE  POCT I-STAT TROPONIN I      Result Value Range   Troponin i, poc 0.05  0.00 -  0.08 ng/mL   Comment 3           SAMPLE TO BLOOD BANK      Result Value Range   Blood Bank Specimen SAMPLE AVAILABLE FOR TESTING     Sample Expiration 02/23/2013     Laboratory interpretation all normal    Dg Chest 1 View  02/22/2013  *RADIOLOGY REPORT*  Clinical Data: Fall  CHEST - 1 VIEW  Comparison: 01/16/2013  Findings: Chronic interstitial markings.  Multifocal opacities in the right upper lobe and bilateral lower lobes, possibly reflecting pneumonia.  No pleural effusion or pneumothorax.  Mild cardiomegaly.  Degenerative changes of the bilateral shoulders.  IMPRESSION: Multifocal patchy opacities in the right upper lobe and bilateral lower lobes, possibly reflecting pneumonia.   Original Report Authenticated By: Charline Bills, M.D.    Dg Hip Complete Right  02/22/2013  *RADIOLOGY REPORT*  Clinical Data: Fall, right hip pain  RIGHT HIP - COMPLETE 2+ VIEW  Comparison: CT right hip dated 04/10/2012  Findings: No acute fracture or dislocation is seen.  Deformity related to old fracture of the right greater trochanter.  Left total hip arthroplasty.  Degenerative changes of the lower lumbar spine.  IMPRESSION: No acute fracture or dislocation is seen.  Deformity related to old fracture of the right greater trochanter.   Left total hip arthroplasty.   Original Report Authenticated By: Charline Bills, M.D.    Dg Finger Little Right  02/22/2013  *RADIOLOGY REPORT*  Clinical Data: Fall  RIGHT LITTLE FINGER 2+V  Comparison: None.  Findings: No fracture or dislocation is seen.  Degenerative changes at the DIP joint.  Visualized soft tissues are grossly unremarkable.  IMPRESSION: No fracture or dislocation is seen.  Degenerative changes at the DIP joint.   Original Report Authenticated By: Charline Bills, M.D.    Dg Finger Ring Right  02/22/2013  *RADIOLOGY REPORT*  Clinical Data: Fall  RIGHT RING FINGER 2+V  Comparison: None.  Findings: No fracture or dislocation is seen.  Degenerative changes of the PIP and DIP joint.  Visualized soft tissues are grossly unremarkable.  IMPRESSION: No fracture or dislocation is seen.  Degenerative changes.   Original Report Authenticated By: Charline Bills, M.D.       Date: 02/22/2013  Rate: 68  Rhythm: normal sinus rhythm  QRS Axis: left  Intervals: normal  ST/T Wave abnormalities: nonspecific ST/T changes  Conduction Disutrbances:left bundle branch block  Narrative Interpretation:   Old EKG Reviewed: unchanged from 01/16/2013 except rate was 92     1. Frequent falls   2. Community acquired pneumonia     New Prescriptions   LEVOFLOXACIN (LEVAQUIN) 750 MG TABLET    Take 1 tablet (750 mg total) by mouth daily.    Plan discharge  Devoria Albe, MD, FACEP   MDM          Ward Givens, MD 02/22/13 [phone]

## 2013-02-22 NOTE — ED Notes (Addendum)
[facility] would like discharge summary faxed to them @ [phone], they also would like IV to be left in place upon discharge.

## 2013-03-02 DIAGNOSIS — J218 Acute bronchiolitis due to other specified organisms: Secondary | ICD-10-CM

## 2013-03-02 HISTORY — DX: Acute bronchiolitis due to other specified organisms: J21.8

## 2013-03-15 DIAGNOSIS — R7989 Other specified abnormal findings of blood chemistry: Secondary | ICD-10-CM

## 2013-03-15 HISTORY — DX: Other specified abnormal findings of blood chemistry: R79.89

## 2013-03-29 ENCOUNTER — Non-Acute Institutional Stay (INDEPENDENT_AMBULATORY_CARE_PROVIDER_SITE_OTHER): Payer: Medicare Other | Admitting: Internal Medicine

## 2013-03-29 ENCOUNTER — Encounter: Payer: Self-pay | Admitting: Internal Medicine

## 2013-03-29 VITALS — BP 100/54 | HR 56 | Temp 97.9°F | Ht 66.0 in | Wt 153.0 lb

## 2013-03-29 DIAGNOSIS — R351 Nocturia: Secondary | ICD-10-CM | POA: Insufficient documentation

## 2013-03-29 MED ORDER — IMIPRAMINE HCL 50 MG PO TABS: ORAL_TABLET | ORAL | Status: DC

## 2013-03-29 NOTE — Patient Instructions (Signed)
Try imipramine to help reduce night urination Avoid fluids after 7 PM

## 2013-03-29 NOTE — Progress Notes (Signed)
Subjective:     [name], female   DOB: 1920/08/28, 79 y.o.   MRN: [id number]  HPI Seems OK with urination in the day, but at night she gets up every 30-90 minutes. Voids a large quantity. She uses Benadryl for sleep. Denies dysuria. No odor to the urine. No edema.  Review of Systems  Constitutional: Negative for fever, chills, weight loss, malaise/fatigue and diaphoresis.  HENT: Positive for hearing loss. Negative for congestion, sore throat and neck pain.   Eyes: Negative for blurred vision and double vision.  Respiratory: Negative for cough, sputum production and shortness of breath.   Cardiovascular: Negative for chest pain, leg swelling and PND.  Gastrointestinal: Negative for heartburn, nausea, diarrhea and constipation.  Genitourinary: Positive for frequency. Negative for dysuria, urgency, hematuria and flank pain.  Musculoskeletal: Negative for myalgias and back pain.  Skin: Negative for itching and rash.  Neurological: Negative for tingling, tremors, sensory change, speech change, focal weakness, seizures, weakness and headaches.  Hematological: Bruises/bleeds easily.  Psychiatric/Behavioral: Positive for memory loss. Negative for depression. The patient does not have insomnia.       Review of Systems  Constitutional: Negative for fever, chills, weight loss, malaise/fatigue and diaphoresis.  HENT: Positive for hearing loss. Negative for congestion, sore throat and neck pain.   Eyes: Negative for blurred vision and double vision.  Respiratory: Negative for cough, sputum production and shortness of breath.   Cardiovascular: Negative for chest pain, leg swelling and PND.  Gastrointestinal: Negative for heartburn, nausea, diarrhea and constipation.  Genitourinary: Positive for frequency. Negative for dysuria, urgency, hematuria and flank pain.  Musculoskeletal: Negative for myalgias and back pain.  Skin: Negative for itching and rash.  Neurological:  Negative for tingling, tremors, sensory change, speech change, focal weakness, seizures, weakness and headaches.  Endo/Heme/Allergies: Bruises/bleeds easily.  Psychiatric/Behavioral: Positive for memory loss. Negative for depression. The patient does not have insomnia.     Objective:   Physical Exam  Constitutional: She appears well-developed and well-nourished.  HENT:  Head: Normocephalic.  Eyes: Conjunctivae are normal. Pupils are equal, round, and reactive to light.  Neck: Normal range of motion. No JVD present. No tracheal deviation present. No thyromegaly present.  Cardiovascular: Normal rate, regular rhythm, normal heart sounds and intact distal pulses.   Pulmonary/Chest: Effort normal and breath sounds normal.  Abdominal: Bowel sounds are normal. She exhibits no mass. There is no tenderness. There is no guarding.  Musculoskeletal: Normal range of motion.  Lymphadenopathy:    She has no cervical adenopathy.  Neurological: She is alert. She has normal reflexes. No cranial nerve deficit. Coordination normal.  Skin: No rash noted. No erythema.  Psychiatric: She has a normal mood and affect. Her behavior is normal. Judgment and thought content normal.       Assessment:     nocturia     Plan:     Avoid fluids after 7 PM.  Try imipramine 50 mg hs.

## 2013-04-09 ENCOUNTER — Emergency Department (HOSPITAL_COMMUNITY): Payer: Medicare Other

## 2013-04-09 ENCOUNTER — Observation Stay (HOSPITAL_COMMUNITY)
Admission: EM | Admit: 2013-04-09 | Discharge: 2013-04-10 | Disposition: A | Discharge status: Home / Self Care | Payer: Medicare Other | Attending: Cardiology | Admitting: Cardiology

## 2013-04-09 ENCOUNTER — Encounter (HOSPITAL_COMMUNITY): Payer: Self-pay | Admitting: Physician Assistant

## 2013-04-09 DIAGNOSIS — R350 Frequency of micturition: Secondary | ICD-10-CM | POA: Insufficient documentation

## 2013-04-09 DIAGNOSIS — R351 Nocturia: Secondary | ICD-10-CM

## 2013-04-09 DIAGNOSIS — R000 Tachycardia, unspecified: Secondary | ICD-10-CM

## 2013-04-09 DIAGNOSIS — I4891 Unspecified atrial fibrillation: Secondary | ICD-10-CM | POA: Insufficient documentation

## 2013-04-09 DIAGNOSIS — I252 Old myocardial infarction: Secondary | ICD-10-CM | POA: Insufficient documentation

## 2013-04-09 DIAGNOSIS — I10 Essential (primary) hypertension: Secondary | ICD-10-CM | POA: Insufficient documentation

## 2013-04-09 DIAGNOSIS — E039 Hypothyroidism, unspecified: Secondary | ICD-10-CM | POA: Insufficient documentation

## 2013-04-09 DIAGNOSIS — I2510 Atherosclerotic heart disease of native coronary artery without angina pectoris: Secondary | ICD-10-CM

## 2013-04-09 DIAGNOSIS — I219 Acute myocardial infarction, unspecified: Secondary | ICD-10-CM

## 2013-04-09 DIAGNOSIS — E785 Hyperlipidemia, unspecified: Secondary | ICD-10-CM

## 2013-04-09 DIAGNOSIS — R7989 Other specified abnormal findings of blood chemistry: Secondary | ICD-10-CM

## 2013-04-09 DIAGNOSIS — I452 Bifascicular block: Secondary | ICD-10-CM | POA: Insufficient documentation

## 2013-04-09 DIAGNOSIS — I2582 Chronic total occlusion of coronary artery: Secondary | ICD-10-CM | POA: Insufficient documentation

## 2013-04-09 DIAGNOSIS — N289 Disorder of kidney and ureter, unspecified: Secondary | ICD-10-CM | POA: Insufficient documentation

## 2013-04-09 DIAGNOSIS — R748 Abnormal levels of other serum enzymes: Secondary | ICD-10-CM | POA: Insufficient documentation

## 2013-04-09 DIAGNOSIS — I471 Supraventricular tachycardia, unspecified: Principal | ICD-10-CM | POA: Insufficient documentation

## 2013-04-09 LAB — CBC WITH DIFFERENTIAL/PLATELET
Eosinophils Absolute: 0 10*3/uL (ref 0.0–0.7)
Eosinophils Relative: 0 % (ref 0–5)
HCT: 46.9 % — ABNORMAL HIGH (ref 36.0–46.0)
Hemoglobin: 16.1 g/dL — ABNORMAL HIGH (ref 12.0–15.0)
Lymphocytes Relative: 21 % (ref 12–46)
Lymphs Abs: 1.8 10*3/uL (ref 0.7–4.0)
MCH: 33.3 pg (ref 26.0–34.0)
MCV: 97.1 fL (ref 78.0–100.0)
Monocytes Absolute: 0.5 10*3/uL (ref 0.1–1.0)
Monocytes Relative: 5 % (ref 3–12)
Platelets: 174 10*3/uL (ref 150–400)
RBC: 4.83 MIL/uL (ref 3.87–5.11)

## 2013-04-09 LAB — BASIC METABOLIC PANEL
CO2: 27 mEq/L (ref 19–32)
Calcium: 9.2 mg/dL (ref 8.4–10.5)
Chloride: 102 mEq/L (ref 96–112)
Glucose, Bld: 160 mg/dL — ABNORMAL HIGH (ref 70–99)
Sodium: 138 mEq/L (ref 135–145)

## 2013-04-09 LAB — URINE MICROSCOPIC-ADD ON

## 2013-04-09 LAB — POCT I-STAT, CHEM 8
Creatinine, Ser: 1.3 mg/dL — ABNORMAL HIGH (ref 0.50–1.10)
Glucose, Bld: 154 mg/dL — ABNORMAL HIGH (ref 70–99)
Hemoglobin: 18.4 g/dL — ABNORMAL HIGH (ref 12.0–15.0)
TCO2: 27 mmol/L (ref 0–100)

## 2013-04-09 LAB — URINALYSIS, ROUTINE W REFLEX MICROSCOPIC
Glucose, UA: NEGATIVE mg/dL
pH: 5.5 (ref 5.0–8.0)

## 2013-04-09 LAB — POCT I-STAT TROPONIN I

## 2013-04-09 MED ORDER — LATANOPROST 0.005 % OP SOLN
1.0000 [drp] | Freq: Every evening | OPHTHALMIC | Status: DC
  Administered 2013-04-09: 1 [drp] via OPHTHALMIC
  Filled 2013-04-09: qty 2.5

## 2013-04-09 MED ORDER — PANTOPRAZOLE SODIUM 40 MG PO TBEC
40.0000 mg | DELAYED_RELEASE_TABLET | Freq: Every day | ORAL | Status: DC
  Administered 2013-04-10: 40 mg via ORAL
  Filled 2013-04-09: qty 1

## 2013-04-09 MED ORDER — METOPROLOL TARTRATE 1 MG/ML IV SOLN
2.5000 mg | Freq: Once | INTRAVENOUS | Status: AC
  Administered 2013-04-09: 2.5 mg via INTRAVENOUS

## 2013-04-09 MED ORDER — ACETAMINOPHEN 325 MG PO TABS: 650.0000 mg | ORAL_TABLET | ORAL | Status: DC | PRN

## 2013-04-09 MED ORDER — DILTIAZEM LOAD VIA INFUSION
5.0000 mg | Freq: Once | INTRAVENOUS | Status: AC
  Administered 2013-04-09: 5 mg via INTRAVENOUS
  Filled 2013-04-09: qty 5

## 2013-04-09 MED ORDER — IMIPRAMINE HCL 50 MG PO TABS
50.0000 mg | ORAL_TABLET | Freq: Every day | ORAL | Status: DC
  Administered 2013-04-09: 50 mg via ORAL
  Filled 2013-04-09 (×2): qty 1

## 2013-04-09 MED ORDER — EZETIMIBE 10 MG PO TABS
10.0000 mg | ORAL_TABLET | Freq: Every day | ORAL | Status: DC
  Administered 2013-04-10: 10 mg via ORAL
  Filled 2013-04-09: qty 1

## 2013-04-09 MED ORDER — ISOSORBIDE MONONITRATE ER 60 MG PO TB24
60.0000 mg | ORAL_TABLET | Freq: Every evening | ORAL | Status: DC
  Filled 2013-04-09: qty 1

## 2013-04-09 MED ORDER — SODIUM CHLORIDE 0.9 % IV BOLUS (SEPSIS)
500.0000 mL | Freq: Once | INTRAVENOUS | Status: AC
  Administered 2013-04-09: 500 mL via INTRAVENOUS

## 2013-04-09 MED ORDER — METOPROLOL TARTRATE 1 MG/ML IV SOLN
INTRAVENOUS | Status: AC
  Administered 2013-04-09: 2.5 mg via INTRAVENOUS
  Filled 2013-04-09: qty 5

## 2013-04-09 MED ORDER — ASPIRIN EC 81 MG PO TBEC
81.0000 mg | DELAYED_RELEASE_TABLET | Freq: Every day | ORAL | Status: DC
  Administered 2013-04-10: 81 mg via ORAL
  Filled 2013-04-09: qty 1

## 2013-04-09 MED ORDER — ADULT MULTIVITAMIN W/MINERALS CH
1.0000 | ORAL_TABLET | Freq: Every day | ORAL | Status: DC
  Administered 2013-04-10: 1 via ORAL
  Filled 2013-04-09: qty 1

## 2013-04-09 MED ORDER — ATORVASTATIN CALCIUM 20 MG PO TABS
20.0000 mg | ORAL_TABLET | Freq: Every day | ORAL | Status: DC
  Administered 2013-04-09: 20 mg via ORAL
  Filled 2013-04-09 (×2): qty 1

## 2013-04-09 MED ORDER — DILTIAZEM HCL 100 MG IV SOLR
5.0000 mg/h | INTRAVENOUS | Status: DC
  Administered 2013-04-09: 5 mg/h via INTRAVENOUS

## 2013-04-09 MED ORDER — METOPROLOL TARTRATE 1 MG/ML IV SOLN: 2.5000 mg | Freq: Once | INTRAVENOUS | Status: AC

## 2013-04-09 MED ORDER — ISOSORBIDE MONONITRATE ER 60 MG PO TB24
60.0000 mg | ORAL_TABLET | Freq: Every morning | ORAL | Status: DC
  Administered 2013-04-10: 60 mg via ORAL
  Filled 2013-04-09: qty 1

## 2013-04-09 MED ORDER — SODIUM CHLORIDE 0.45 % IV SOLN
INTRAVENOUS | Status: AC
  Administered 2013-04-09: 1000 mL via INTRAVENOUS

## 2013-04-09 MED ORDER — ONDANSETRON HCL 4 MG/2ML IJ SOLN: 4.0000 mg | Freq: Four times a day (QID) | INTRAMUSCULAR | Status: DC | PRN

## 2013-04-09 MED ORDER — NITROGLYCERIN 0.4 MG SL SUBL: 0.4000 mg | SUBLINGUAL_TABLET | SUBLINGUAL | Status: DC | PRN

## 2013-04-09 MED ORDER — ASPIRIN 81 MG PO CHEW
324.0000 mg | CHEWABLE_TABLET | Freq: Once | ORAL | Status: AC
  Administered 2013-04-09: 324 mg via ORAL
  Filled 2013-04-09: qty 4

## 2013-04-09 MED ORDER — LEVOTHYROXINE SODIUM 25 MCG PO TABS
25.0000 ug | ORAL_TABLET | Freq: Every day | ORAL | Status: DC
  Administered 2013-04-10: 25 ug via ORAL
  Filled 2013-04-09: qty 1

## 2013-04-09 MED ORDER — METOPROLOL TARTRATE 25 MG PO TABS
37.5000 mg | ORAL_TABLET | Freq: Two times a day (BID) | ORAL | Status: DC
  Administered 2013-04-09 – 2013-04-10 (×2): 37.5 mg via ORAL
  Filled 2013-04-09 (×3): qty 1

## 2013-04-09 MED ORDER — ADENOSINE 6 MG/2ML IV SOLN
6.0000 mg | Freq: Once | INTRAVENOUS | Status: AC
  Administered 2013-04-09: 6 mg via INTRAVENOUS

## 2013-04-09 MED ORDER — BRIMONIDINE TARTRATE 0.2 % OP SOLN
1.0000 [drp] | Freq: Two times a day (BID) | OPHTHALMIC | Status: DC
  Administered 2013-04-10: 1 [drp] via OPHTHALMIC
  Filled 2013-04-09: qty 5

## 2013-04-09 NOTE — ED Notes (Signed)
Pt remains in SVT. Giving adenosine. Dr. Effie Shy at bedside. Pt on monitor with pads on

## 2013-04-09 NOTE — ED Notes (Signed)
Pt.s heart rate dropped to the 67. SR, 2nd ekg shot and given to cardiology, Cardizem turned off per cards

## 2013-04-09 NOTE — ED Notes (Signed)
Pt to department from [facility]- pt reports she has not feeling well this morning. Had an episode of vomiting with taking her medication this morning. Also has had a low bp this morning. Pt is weak but A&Ox4 at triage. HR noted to be in 160 and Bp-80/60.

## 2013-04-09 NOTE — H&P (Signed)
CARDIOLOGY CONSULT NOTE  [name], MRN: [id number], DOB/AGE: 1920-10-17 79 y.o. Admit date: 04/09/2013   Date of Consult: 04/09/2013 Primary Physician: Kimber Relic, MD Primary Cardiologist: Hochrein  Chief Complaint: don't feel well, vomiting Reason for Consult: SVT  HPI: [name] is a 79 y/o F with history of HTN and CAD/SVT who presented to [facility] today with dizziness and SVT. She was first diagnosed with SVT in 2010 when she had an NSTEMI with cath showing chronic total occlusion of the left anterior descending with R-> collaterals, failing PCI. She has been maintained on med rx for her SVT. She has previously required adenosine to break (including during an episode in the office 12/2012). She lives at [facility] and has felt well for the past few days (except was mildly weak on Mon/Tues, but by Wed/Thurs felt like herself again). Last night, she had to get up several times during the night to urinate but denies dysuria, hematuria or other symptoms at that time. This morning when she woke up, she felt dizzy and weak similar to her prior SVT symptoms. She denied CP, SOB, or syncope. She threw up once but went on to church. After church she still felt poorly so sought care with the [facility] where she was found to be tachycardiac and was referred to the ER. At triage, HR noted to be 160 and BP 80/60. She was given an IV fluid bolus. She was given 6mg  of adenosine with break to sinus tach, then recurrence of arrhythmia. She was given IV Lopressor 5mg  with again brief conversion to NSR/ST and recurrence of SVT, then 5mg  cardizem load again with transient conversnon, and then placed on a drip when she went back into SVT. While on a drip at 52ml/hour, she spontaneously converted back to NSR. She feels much better now. BP remained stable and improved with these measures. CXR shows patchy opacity in left lung base may represent atx and/or  infiltrate. Istat labs show Hgb 18.4, BUN/Cr 27/1.30 (range 0.77-1.3), troponin mildly up at 0.10 thus given 324mg  ASA. She is not tachypnic or hypoxic. She usually gets a sporadic clear mucus cough this time of year which she attributes to the pollen.  Past Medical History  Diagnosis Date  . SVT (supraventricular tachycardia)     Diagnosed 2010  . HTN (hypertension)   . Dyslipidemia   . CAD (coronary artery disease)     a. NSTEMI 2010 in setting of SVT, with cath with unsuccessful PCI - chronic total occlusion of LAD, R->L collaterals.  . GI bleeding   . Unspecified hypothyroidism   . Unspecified hereditary and idiopathic peripheral neuropathy   . Pneumonia, organism unspecified   . Basal cell carcinoma of skin of trunk, except scrotum   . Unspecified malignant neoplasm of skin of lower limb, including hip   . Basal cell carcinoma of skin of lower limb, including hip   . Unspecified glaucoma(365.9)     left eye  . Other premature beats   . Other esophagitis   . Esophageal reflux   . Osteoarthrosis, unspecified whether generalized or localized, unspecified site     multiple joints  . Pain in joint, shoulder region   . Pain in joint, lower leg     knee  . Pain in joint, ankle and foot     right  . Sacroiliitis, not elsewhere classified   . Cervicalgia   . Lumbago   . Ganglion of tendon sheath   .  Pathologic fracture of vertebrae   . Tietze's disease   . Insomnia, unspecified   . Disturbance of skin sensation   . Edema   . Palpitations   . Other nonspecific abnormal serum enzyme levels   . Nasal bones, closed fracture   . Closed fracture of unspecified trochanteric section of femur   . Open wound of knee, leg (except thigh), and ankle, without mention of complication   . Hip, thigh, leg, and ankle, abrasion or friction burn, without mention of infection   . Contusion of hip   . Fall from other slipping, tripping, or stumbling   . Personal history of fall       Most  Recent Cardiac Studies: Cardiac Cath 03/2009 RESULTS: The left main coronary artery is widely patent and bifurcates  into the left anterior descending artery and left circumflex artery.  Left anterior descending artery is patent proximally and gives rise to a  very large first diagonal. The first diagonal is widely patent and  bifurcates into 2 daughter branches, both of which are widely patent.  Just distal to the takeoff of the diagonal, there is a tapered occlusion  of the LAD. There is evidence of distal LAD filling via right-to-left  collaterals from the distal RCA.  The left circumflex artery traverses the AV groove and is diffusely  diseased up to 30% with luminal irregularities. It gives rise to a  first obtuse marginal which is very small and second obtuse marginal is  moderate in size and widely patent. There was a third obtuse marginal  and then it has a 60-70% ostial stenosis and just distal to the takeoff  of third obtuse marginal, there is also a 60% narrowing of the distal  left circumflex.  The right coronary artery is extremely large and bifurcates distally in  a posterior descending artery and posterolateral artery, both of which  are widely patent. There is evidence of right-to-left collateral flow  to the LAD.  Left ventriculography shows normal LV function, EF 60%, LV pressure  135/7 mmHg, aortic pressure 138/58 mmHg, and LVEDP 12 mmHg.  ASSESSMENT:  1. One-vessel obstructive coronary artery disease of the left anterior  descending with borderline obstructive disease of the left  circumflex obtuse marginal system.  2. Normal left ventricular function.  3. Status post non-ST-elevation myocardial infarction.  PLAN: PCI of the LAD per Dr. Eldridge Dace. Medical management of the left  circumflex and OM.   F/u attempted PCI: IMPRESSION:  1. 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  2. Right-to-left collaterals.  3. Normal left ventricular function.  4.  Likely demand ischemia from increased heart rate.  RECOMMENDATIONS: We will watch the patient overnight. She will need  aggressive medical therapy for her SVT. Assuming no complications, the  patient may be able to go home tomorrow.    Surgical History:  Past Surgical History  Procedure Laterality Date  . Hip surgery    . Breast lumpectomy  1995    right breast, negative for malignancy  . Cataract extraction  2003    bilateral     Home Meds: Prior to Admission medications   Medication Sig Start Date End Date Taking? Authorizing Provider  amlodipine-benazepril (LOTREL) 2.5-10 MG per capsule Take 1 capsule by mouth daily. 01/19/13   Rollene Rotunda, MD  aspirin EC 81 MG tablet Take 81 mg by mouth every evening.    Historical Provider, MD  atorvastatin (LIPITOR) 20 MG tablet Take 20 mg by mouth daily.  Historical Provider, MD  brimonidine (ALPHAGAN) 0.2 % ophthalmic solution Place 1 drop into both eyes 2 (two) times daily.    Historical Provider, MD  ezetimibe (ZETIA) 10 MG tablet Take 10 mg by mouth daily.      Historical Provider, MD  imipramine (TOFRANIL) 50 MG tablet One at bedtime to reduce urinary frequency 03/29/13   Kimber Relic, MD  isosorbide mononitrate (IMDUR) 60 MG 24 hr tablet Take 60 mg by mouth every evening.     Historical Provider, MD  latanoprost (XALATAN) 0.005 % ophthalmic solution Place 1 drop into both eyes every evening. UAD    Historical Provider, MD  levothyroxine (SYNTHROID, LEVOTHROID) 25 MCG tablet Take 25 mcg by mouth daily.    Historical Provider, MD  metoprolol tartrate (LOPRESSOR) 25 MG tablet Take 1 tablet (25 mg total) by mouth 2 (two) times daily. 01/19/13   Rollene Rotunda, MD  Multiple Vitamin (MULTIVITAMIN WITH MINERALS) TABS Take 1 tablet by mouth daily. Certavite-A    Historical Provider, MD  OVER THE COUNTER MEDICATION Take 1 tablet by mouth at bedtime. Sleep ease from [employer]    Historical Provider, MD  pantoprazole (PROTONIX) 40 MG tablet Take  40 mg by mouth daily.      Historical Provider, MD    Inpatient Medications:    Allergies: No Known Allergies  History   Social History  . Marital Status: Married    Spouse Name: N/A    Number of Children: N/A  . Years of Education: N/A   Occupational History  . Not on file.   Social History Main Topics  . Smoking status: Never Smoker   . Smokeless tobacco: Never Used     Comment: denies smoking cigarettes  . Alcohol Use: Yes     Comment: drinks a scotch or wine every other day   . Drug Use: No  . Sexually Active: Not on file   Other Topics Concern  . Not on file   Social History Narrative  . No narrative on file     Family History  Problem Relation Age of Onset  . Stroke Neg Hx   . Heart attack Neg Hx   . Heart disease Father   . Cancer Brother     lung     Review of Systems: General: negative for chills, fever, night sweats Cardiovascular: no edema, palpitations, dyspnea. Dermatological: negative for rash Respiratory: tends to have a clear mucus cough this time of year with pollen. Urologic: negative for hematuria Abdominal: negative for diarrhea, bright red blood per rectum, melena, or hematemesis Neurologic: negative for visual changes, syncope, All other systems reviewed and are otherwise negative except as noted above.  Labs: poc troponin 0.1 Lab Results  Component Value Date   HGB 18.4* 04/09/2013   HCT 54.0* 04/09/2013    Recent Labs Lab 04/09/13 1207  NA 140  K 4.4  CL 104  BUN 27*  CREATININE 1.30*  GLUCOSE 154*     Radiology/Studies:  Dg Chest Portable 1 View  04/09/2013  *RADIOLOGY REPORT*  Clinical Data: Irregular heart beat  PORTABLE CHEST - 1 VIEW  Comparison: Prior chest x-ray 02/22/2013  Findings: The external defibrillator pads project over an partially obscures the left hemithorax.  Mild pulmonary vascular congestion is similar to prior studies.  Patchy opacity in the left lung base may reflect infiltrate or atelectasis.   Cardiac and mediastinal contours are unchanged.  There is aortic atherosclerosis.  Patchy opacities in the right lung on the  prior study have resolved.  No large effusion or pneumothorax.  No acute osseous abnormality. Degenerative changes of the right glenohumeral joint.  IMPRESSION:  1.  Patchy opacity in the left lung base may represent atelectasis and / or infiltrate. 2.  External defibrillator pads partially obscure the left hemithorax. 3.  Aortic atherosclerosis   Original Report Authenticated By: Malachy Moan, M.D.     EKG: SVT 166bpm with borderline LBBB, TWi I, avL, no acute St-T changes F/u ekg: NSR 71bpm LBBB, TWI I, avL, isolated PAC/PVC  Physical Exam: Blood pressure 151/77, pulse 66, temperature 98.1 F (36.7 C), temperature source Oral, resp. rate 20, SpO2 100.00%. General: Well developed, well appearing WF in no acute distress. Head: Normocephalic, atraumatic, sclera non-icteric, no xanthomas, nares are without discharge.  Neck: Negative for carotid bruits. JVD not elevated. Lungs: Clear bilaterally to auscultation without wheezes, rales, or rhonchi. Breathing is unlabored. Heart: RRR with S1 S2, occasional ectopy, distant heart sounds. No murmurs, rubs, or gallops appreciated. Abdomen: Soft, non-tender, non-distended with normoactive bowel sounds. No hepatomegaly. No rebound/guarding. No obvious abdominal masses. Msk:  Strength and tone appear normal for age. Extremities: No clubbing or cyanosis. No edema.  Distal pedal pulses are 2+ and equal bilaterally. Neuro: Alert and oriented X 3. No facial asymmetry. No focal deficit. Moves all extremities spontaneously. Psych:  Responds to questions appropriately with a normal affect.   Assessment and Plan:   1. Paroxysmal SVT 2. CAD with mildly elevated troponin 3. Urinary frequency 4. Acute renal insufficiency 5. Mildly abnormal chest x-ray  Patient converted to NSR after administration of adenosine/lopressor/diltiazem by  the ER. She feels much better but question if there is any underlying infectious component contributing to her SVT by way of either urinary frequency or abnormal CXR. Clinically she is not behaving like a pneumonia but await regular labs including CBC, BMET, UA, urine culture, TSH, free T4. Watch on telemetry. Hold benazepril/HCTZ given her renal insufficiency. Increase Lopressor to 37.5mg  BID. Gently hydrate. Suspect troponin mildly elevated to demand ischemia in the setting of known CAD as she has had no chest pain or SOB. Will cycle enzymes to trend. With history of GI bleeding, would not anticoagulate at this time unless she develops CP/SOB. Continue ASA, BB, statin, Imdur.  Signed, Dayna Dunn PA-C 04/09/2013, 1:48 PM  Seen in ER.  Patient presently maintaining NSR. No chest pain. Mildly abnormal chest xray with atelectasis at left base but denies any fever or increased cough. Physical exam unremarkable.  Lungs clear to auscultation. CBC and WBC pending. Will trend cardiac enzymes, observe on telemetry overnight, anticipate back to her apartment  at [facility] tomorrow where she lives with her 54 year old husband.

## 2013-04-09 NOTE — ED Provider Notes (Signed)
History     CSN: [id number]  Arrival date & time 04/09/13  1118   First MD Initiated Contact with Patient 04/09/13 1132      Chief Complaint  Patient presents with  . Irregular Heart Beat    (Consider location/radiation/quality/duration/timing/severity/associated sxs/prior treatment) HPI  Past Medical History  Diagnosis Date  . SVT (supraventricular tachycardia)   . HTN (hypertension)   . Dyslipidemia   . CAD (coronary artery disease)     cath 4/10. distal LAD 100%, circ distal 60%, OM2 30%   . GI bleeding   . MI (myocardial infarction)   . Unspecified hypothyroidism   . Unspecified hereditary and idiopathic peripheral neuropathy   . Pneumonia, organism unspecified   . Basal cell carcinoma of skin of trunk, except scrotum   . Unspecified malignant neoplasm of skin of lower limb, including hip   . Basal cell carcinoma of skin of lower limb, including hip   . Unspecified glaucoma(365.9)     left eye  . Other premature beats   . Other esophagitis   . Esophageal reflux   . Osteoarthrosis, unspecified whether generalized or localized, unspecified site     multiple joints  . Pain in joint, shoulder region   . Pain in joint, lower leg     knee  . Pain in joint, ankle and foot     right  . Sacroiliitis, not elsewhere classified   . Cervicalgia   . Lumbago   . Ganglion of tendon sheath   . Pathologic fracture of vertebrae   . Tietze's disease   . Insomnia, unspecified   . Disturbance of skin sensation   . Edema   . Palpitations   . Other nonspecific abnormal serum enzyme levels   . Nasal bones, closed fracture   . Closed fracture of unspecified trochanteric section of femur   . Open wound of knee, leg (except thigh), and ankle, without mention of complication   . Hip, thigh, leg, and ankle, abrasion or friction burn, without mention of infection   . Contusion of hip   . Fall from other slipping, tripping, or stumbling   . Personal history of fall     Past  Surgical History  Procedure Laterality Date  . Hip surgery    . Breast lumpectomy  1995    right breast, negative for malignancy  . Cataract extraction  2003    bilateral    Family History  Problem Relation Age of Onset  . Stroke Neg Hx   . Heart attack Neg Hx   . Heart disease Father   . Cancer Brother     lung    History  Substance Use Topics  . Smoking status: Never Smoker   . Smokeless tobacco: Never Used     Comment: denies smoking cigarettes  . Alcohol Use: Yes     Comment: drinks a scotch or wine every other day     OB History   Grav Para Term Preterm Abortions TAB SAB Ect Mult Living                  Review of Systems  Allergies  Review of patient's allergies indicates no known allergies.  Home Medications   Current Outpatient Rx  Name  Route  Sig  Dispense  Refill  . amlodipine-benazepril (LOTREL) 2.5-10 MG per capsule   Oral   Take 1 capsule by mouth daily.   30 capsule   12   . aspirin EC 81 MG tablet  Oral   Take 81 mg by mouth every evening.         Marland Kitchen atorvastatin (LIPITOR) 20 MG tablet   Oral   Take 20 mg by mouth daily.           . brimonidine (ALPHAGAN) 0.2 % ophthalmic solution   Both Eyes   Place 1 drop into both eyes 2 (two) times daily.         Marland Kitchen ezetimibe (ZETIA) 10 MG tablet   Oral   Take 10 mg by mouth daily.           Marland Kitchen imipramine (TOFRANIL) 50 MG tablet      One at bedtime to reduce urinary frequency   30 tablet   3   . isosorbide mononitrate (IMDUR) 60 MG 24 hr tablet   Oral   Take 60 mg by mouth every evening.          . latanoprost (XALATAN) 0.005 % ophthalmic solution   Both Eyes   Place 1 drop into both eyes every evening. UAD         . levothyroxine (SYNTHROID, LEVOTHROID) 25 MCG tablet   Oral   Take 25 mcg by mouth daily.         . metoprolol tartrate (LOPRESSOR) 25 MG tablet   Oral   Take 1 tablet (25 mg total) by mouth 2 (two) times daily.   60 tablet   12   . Multiple Vitamin  (MULTIVITAMIN WITH MINERALS) TABS   Oral   Take 1 tablet by mouth daily. Certavite-A         . OVER THE COUNTER MEDICATION   Oral   Take 1 tablet by mouth at bedtime. Sleep ease from [employer]         . pantoprazole (PROTONIX) 40 MG tablet   Oral   Take 40 mg by mouth daily.             BP 105/82  Pulse 129  Temp(Src) 98.1 F (36.7 C) (Oral)  Resp 15  SpO2 100%  Physical Exam  ED Course  Procedures (including critical care time) IV Fluid bolus Adenosine  Tachyarrhythmia treatment-  Adenosine 6 mg heart rate lowered to 100, transiently. With apparent P wave indicating sinus tachycardia. However, this quickly degenerated into a similar elevated heart rate that appeared to be SVT, this time, closer to 150 . With history of atrial fibrillation, I felt it more likely to be atrial flutter, so she was treated with Lopressor IV x2 doses. She received a total of 5 mg of Lopressor with heart rate decreased to 130. Her pressure improved to 110 systolic. The heart rate did not lowered initially, so a Cardizem load was given with a bolus. Initial subjective an improved rate to about 120. Blood pressure remained reassuring.    13:13- troponin slightly elevated well add aspirin, and contact cardiology to evaluate the patient and likely admit the patient.  Consultation-  [facility] will see; 13:22    Date: 10/16/2012- 11:23  Rate: 166  Rhythm: supraventricular tachycardia (SVT)  QRS Axis: left  PR and QT Intervals: Normal QT  ST/T Wave abnormalities: nonspecific ST changes  PR and QRS Conduction Disutrbances:Normal QT  Narrative Interpretation:   Old EKG Reviewed: changes noted- 02/23/13- rate faster, sinus rhythm at this time    Date: 10/16/2012- 11:56  Rate: 133  Rhythm: indeterminate  QRS Axis: normal  PR and QT Intervals: Normal QT  ST/T Wave abnormalities: nonspecific ST changes  PR and QRS Conduction Disutrbances:right bundle branch block and left anterior  fascicular block  Narrative Interpretation:   Old EKG Reviewed: changes noted  Rate lower cf.. Earlier today      CRITICAL CARE Performed by: Flint Melter   Total critical care time: 65 minutes  Critical care time was exclusive of separately billable procedures and treating other patients.  Critical care was necessary to treat or prevent imminent or life-threatening deterioration.  Critical care was time spent personally by me on the following activities: development of treatment plan with patient and/or surrogate as well as nursing, discussions with consultants, evaluation of patient's response to treatment, examination of patient, obtaining history from patient or surrogate, ordering and performing treatments and interventions, ordering and review of laboratory studies, ordering and review of radiographic studies, pulse oximetry and re-evaluation of patient's condition.  Labs Reviewed  POCT I-STAT, CHEM 8 - Abnormal; Notable for the following:    BUN 27 (*)    Creatinine, Ser 1.30 (*)    Glucose, Bld 154 (*)    Calcium, Ion 1.12 (*)    Hemoglobin 18.4 (*)    HCT 54.0 (*)    All other components within normal limits  POCT I-STAT TROPONIN I - Abnormal; Notable for the following:    Troponin i, poc 0.10 (*)    All other components within normal limits  URINE CULTURE  URINALYSIS, ROUTINE W REFLEX MICROSCOPIC   Dg Chest Portable 1 View  04/09/2013  *RADIOLOGY REPORT*  Clinical Data: Irregular heart beat  PORTABLE CHEST - 1 VIEW  Comparison: Prior chest x-ray 02/22/2013  Findings: The external defibrillator pads project over an partially obscures the left hemithorax.  Mild pulmonary vascular congestion is similar to prior studies.  Patchy opacity in the left lung base may reflect infiltrate or atelectasis.  Cardiac and mediastinal contours are unchanged.  There is aortic atherosclerosis.  Patchy opacities in the right lung on the prior study have resolved.  No large effusion or  pneumothorax.  No acute osseous abnormality. Degenerative changes of the right glenohumeral joint.  IMPRESSION:  1.  Patchy opacity in the left lung base may represent atelectasis and / or infiltrate. 2.  External defibrillator pads partially obscure the left hemithorax. 3.  Aortic atherosclerosis   Original Report Authenticated By: Malachy Moan, M.D.    Nursing Notes Reviewed/ Care Coordinated, and agree without changes. Applicable Imaging Reviewed.  Interpretation of Laboratory Data incorporated into ED treatment  1. Tachyarrhythmia   2. Atrial fibrillation   3. Urinary frequency      MDM  Palpitations with tachycardia; differential diagnosis includes atrial fibrillation with rapid ventricular response, atrial flutter, with variable block, and SVT. Mild elevation of troponin is probably related to rate. The outstanding. Possible UTI, leading to dehydration as an inciting event. Patient will require admission for further treatment, and observation.   Plan: Admit to cardiology        Flint Melter, MD 04/09/13 1332

## 2013-04-10 LAB — CBC
HCT: 41.5 % (ref 36.0–46.0)
Hemoglobin: 14.1 g/dL (ref 12.0–15.0)
MCV: 96.5 fL (ref 78.0–100.0)
RBC: 4.3 MIL/uL (ref 3.87–5.11)
WBC: 8.2 10*3/uL (ref 4.0–10.5)

## 2013-04-10 LAB — BASIC METABOLIC PANEL
BUN: 23 mg/dL (ref 6–23)
CO2: 29 mEq/L (ref 19–32)
Chloride: 104 mEq/L (ref 96–112)
Creatinine, Ser: 0.91 mg/dL (ref 0.50–1.10)
Potassium: 4.2 mEq/L (ref 3.5–5.1)

## 2013-04-10 LAB — URINE CULTURE: Colony Count: >=100000

## 2013-04-10 LAB — TROPONIN I: Troponin I: <0.3 ng/mL (ref <0.30)

## 2013-04-10 MED ORDER — BENAZEPRIL HCL 10 MG PO TABS
10.0000 mg | ORAL_TABLET | Freq: Every day | ORAL | Status: DC
  Administered 2013-04-10: 10 mg via ORAL
  Filled 2013-04-10: qty 1

## 2013-04-10 MED ORDER — AMLODIPINE BESYLATE 2.5 MG PO TABS
2.5000 mg | ORAL_TABLET | Freq: Every day | ORAL | Status: DC
  Administered 2013-04-10: 2.5 mg via ORAL
  Filled 2013-04-10: qty 1

## 2013-04-10 MED ORDER — METOPROLOL TARTRATE 25 MG PO TABS: 37.5000 mg | ORAL_TABLET | Freq: Two times a day (BID) | ORAL | Status: DC

## 2013-04-10 NOTE — Progress Notes (Signed)
Subjective:  The patient is feeling well this morning.  She has had no further episodes of sustained SVT.  She is tolerating the higher dose of metoprolol.  Her systolic blood pressure is still elevated and we will resume her home dose of Lotrel 2.5/10 one daily. Her cardiac enzymes are negative for myocardial damage.  The patient is not having any chest pain or dyspnea.  Her renal function has improved overnight with gentle hydration.  The patient is afebrile.  No cough or sputum.  Objective:  Vital Signs in the last 24 hours: Temp:  [97.5 F (36.4 C)-98.1 F (36.7 C)] 97.6 F (36.4 C) (04/12 0500) Pulse Rate:  [54-166] 54 (04/12 0500) Resp:  [14-28] 18 (04/12 0500) BP: (80-174)/(60-85) 174/79 mmHg (04/12 0500) SpO2:  [94 %-100 %] 94 % (04/12 0500) Weight:  [150 lb 4.8 oz (68.176 kg)] 150 lb 4.8 oz (68.176 kg) (04/11 1554)  Intake/Output from previous day: 04/11 0701 - 04/12 0700 In: 426.7 [P.O.:360; I.V.:66.7] Out: -  Intake/Output from this shift:    . amLODipine  2.5 mg Oral Daily   And  . benazepril  10 mg Oral Daily  . aspirin EC  81 mg Oral Daily  . atorvastatin  20 mg Oral q1800  . brimonidine  1 drop Both Eyes BID  . ezetimibe  10 mg Oral Daily  . imipramine  50 mg Oral QHS  . isosorbide mononitrate  60 mg Oral q morning - 10a  . latanoprost  1 drop Both Eyes QPM  . levothyroxine  25 mcg Oral Daily  . metoprolol tartrate  37.5 mg Oral BID  . multivitamin with minerals  1 tablet Oral Daily  . pantoprazole  40 mg Oral Daily      Physical Exam: The patient appears to be in no distress.  Head and neck exam reveals that the pupils are equal and reactive.  The extraocular movements are full.  There is no scleral icterus.  Mouth and pharynx are benign.  No lymphadenopathy.  No carotid bruits.  The jugular venous pressure is normal.  Thyroid is not enlarged or tender.  Chest is clear to percussion and auscultation.  No rales or rhonchi.  Expansion of the chest is  symmetrical.  Heart reveals no abnormal lift or heave.  First and second heart sounds are normal.  There is no murmur gallop rub or click.  The abdomen is soft and nontender.  Bowel sounds are normoactive.  There is no hepatosplenomegaly or mass.  There are no abdominal bruits.  Extremities reveal no phlebitis or edema.  Pedal pulses are good.  There is no cyanosis or clubbing.  Neurologic exam is normal strength and no lateralizing weakness.  No sensory deficits.  Integument reveals no rash  Lab Results:  Recent Labs  04/09/13 1613 04/10/13 0307  WBC 8.7 8.2  HGB 16.1* 14.1  PLT 174 172    Recent Labs  04/09/13 1613 04/10/13 0307  NA 138 138  K 4.2 4.2  CL 102 104  CO2 27 29  GLUCOSE 160* 83  BUN 25* 23  CREATININE 1.04 0.91    Recent Labs  04/09/13 2052 04/10/13 0307  TROPONINI <0.30 <0.30   Hepatic Function Panel No results found for this basename: PROT, ALBUMIN, AST, ALT, ALKPHOS, BILITOT, BILIDIR, IBILI,  in the last 72 hours No results found for this basename: CHOL,  in the last 72 hours No results found for this basename: PROTIME,  in the last 72 hours  Imaging: Dg Chest Portable 1 View  04/09/2013  *RADIOLOGY REPORT*  Clinical Data: Irregular heart beat  PORTABLE CHEST - 1 VIEW  Comparison: Prior chest x-ray 02/22/2013  Findings: The external defibrillator pads project over an partially obscures the left hemithorax.  Mild pulmonary vascular congestion is similar to prior studies.  Patchy opacity in the left lung base may reflect infiltrate or atelectasis.  Cardiac and mediastinal contours are unchanged.  There is aortic atherosclerosis.  Patchy opacities in the right lung on the prior study have resolved.  No large effusion or pneumothorax.  No acute osseous abnormality. Degenerative changes of the right glenohumeral joint.  IMPRESSION:  1.  Patchy opacity in the left lung base may represent atelectasis and / or infiltrate. 2.  External defibrillator pads  partially obscure the left hemithorax. 3.  Aortic atherosclerosis   Original Report Authenticated By: Malachy Moan, M.D.     Cardiac Studies: Telemetry shows normal sinus rhythm.  Short run of 8 beats of asymptomatic SVT with aberration during the night. Assessment/Plan:  1. Paroxysmal SVT  2. CAD  3. Urinary frequency.  Urinalysis shows negative nitrite and rare bacteria.  Urine culture is pending and can be followed up after discharge. 4. Acute renal insufficiency, improved  5. Mildly abnormal chest x-ray consistent with atelectasis.   Plan: I have resumed her outpatient dose of Lotrel.  She is stable for discharge this morning.  She should followup with Dr. Antoine Poche or PA in one or 2 weeks.  She will be going home on a slightly larger dose of beta blocker 37.5 mg twice a day   LOS: 1 day    Cassell Clement 04/10/2013, 9:16 AM

## 2013-04-10 NOTE — Discharge Summary (Signed)
CARDIOLOGY DISCHARGE SUMMARY    [name],  MRN: [id number], DOB/AGE: September 14, 1920 80 y.o.  Admit date: 04/09/2013 Discharge date: 04/10/2013  Primary Care Physician: Murray Hodgkins, MD Primary Cardiologist: Antoine Poche, MD  Primary Discharge Diagnosis:  1. Paroxysmal SVT 2. Elevated troponin - due to demand ischemia in setting of SVT with underlying CAD; no CP/SOB/anginal symptoms 3. Acute renal injury, resolved 4. Increased urinary frequency - UA neg nitrites with rare bacteria, urine culture pending at DC  Secondary Discharge Diagnoses:  1. HTN 2. Dyslipidemia 3. CAD  4. Hypothyroidism 5. History of GI bleed  Procedures This Admission:  None   History and Hospital Course:  [name] is a 79 year old woman with known PSVT, CAD, HTN and dyslipidemia who presented to [facility] yesterday with dizziness and SVT at 160 bpm. She was hypotensive on presentation with BP 80/60. Her troponin was mildly elevated which was felt to represent demand ischemia in setting of tachycardia with underlying CAD. She denies any anginal symptoms, specifically CP or SOB. She converted to NSR after administration of adenosine in the ER. She was also given IVF bolus. Her symptoms resolved and she was feeling much better. Of note, on admission she noted increased urinary frequency. Urinalysis was negative for nitrites with rare bacteria. Urine culture still pending and Dr. Patty Sermons felt that could be followed up on as an outpatient. She was hydrated with improvement in her serum Cr. She was observed overnight on telemetry. She did not have any recurrent sustained SVT. Her BB dose was increased to 37.5 mg twice daily. She has been seen, examined and deemed stable for DC home today by Dr. Cassell Clement. She will follow-up with Dr. Antoine Poche or PA/NP in 1-2 weeks.  Discharge Vitals: Blood pressure 139/73, pulse 57, temperature 97.6 F (36.4 C), temperature source Oral, resp. rate 18,  height 5\' 6"  (1.676 m), weight 150 lb 4.8 oz (68.176 kg), SpO2 94.00%.   Labs: Lab Results  Component Value Date   WBC 8.2 04/10/2013   HGB 14.1 04/10/2013   HCT 41.5 04/10/2013   MCV 96.5 04/10/2013   PLT 172 04/10/2013    Recent Labs Lab 04/10/13 0307  NA 138  K 4.2  CL 104  CO2 29  BUN 23  CREATININE 0.91  CALCIUM 9.1  GLUCOSE 83   Lab Results  Component Value Date   CKTOTAL 56 03/20/2010   CKMB 4.1* 03/20/2010   TROPONINI <0.30 04/10/2013     Disposition:  The patient is being discharged in stable condition.  Follow-up: Follow-up Information   Follow up with Tereso Newcomer, PA-C On 04/26/2013. (At 2:20 PM for hospital follow-up)    Contact information:   [address] [place] [address] [phone]     Discharge Medications:    Medication List    TAKE these medications       amlodipine-benazepril 2.5-10 MG per capsule  Commonly known as:  LOTREL  Take 1 capsule by mouth daily.     aspirin EC 81 MG tablet  Take 81 mg by mouth every evening.     atorvastatin 20 MG tablet  Commonly known as:  LIPITOR  Take 20 mg by mouth daily.     brimonidine 0.2 % ophthalmic solution  Commonly known as:  ALPHAGAN  Place 1 drop into both eyes 2 (two) times daily.     ezetimibe 10 MG tablet  Commonly known as:  ZETIA  Take 10 mg by mouth  daily.     imipramine 50 MG tablet  Commonly known as:  TOFRANIL  One at bedtime to reduce urinary frequency     isosorbide mononitrate 60 MG 24 hr tablet  Commonly known as:  IMDUR  Take 60 mg by mouth every evening.     latanoprost 0.005 % ophthalmic solution  Commonly known as:  XALATAN  Place 1 drop into both eyes every evening. UAD     levothyroxine 25 MCG tablet  Commonly known as:  SYNTHROID, LEVOTHROID  Take 25 mcg by mouth daily.     metoprolol tartrate 25 MG tablet  Commonly known as:  LOPRESSOR  Take 1.5 tablets (37.5 mg total) by mouth 2 (two) times daily.     multivitamin with minerals Tabs   Take 1 tablet by mouth daily. Certavite-A     OVER THE COUNTER MEDICATION  Take 1 tablet by mouth at bedtime. Sleep ease from [employer]     pantoprazole 40 MG tablet  Commonly known as:  PROTONIX  Take 40 mg by mouth daily.       Duration of Discharge Encounter: Greater than 30 minutes including physician time.  Signed, Rick Duff, PA-C 04/10/2013, 12:05 PM

## 2013-04-11 LAB — URINE CULTURE
Colony Count: 30000
Special Requests: NORMAL

## 2013-04-12 ENCOUNTER — Other Ambulatory Visit: Payer: Self-pay | Admitting: Emergency Medicine

## 2013-04-12 ENCOUNTER — Other Ambulatory Visit: Payer: Self-pay | Admitting: *Deleted

## 2013-04-12 MED ORDER — METOPROLOL TARTRATE 25 MG PO TABS: 37.5000 mg | ORAL_TABLET | Freq: Two times a day (BID) | ORAL | Status: DC

## 2013-04-12 MED ORDER — LEVOTHYROXINE SODIUM 25 MCG PO TABS: ORAL_TABLET | ORAL | Status: DC

## 2013-04-26 ENCOUNTER — Encounter: Payer: Self-pay | Admitting: Physician Assistant

## 2013-04-26 ENCOUNTER — Ambulatory Visit (INDEPENDENT_AMBULATORY_CARE_PROVIDER_SITE_OTHER): Payer: Medicare Other | Admitting: Physician Assistant

## 2013-04-26 VITALS — BP 132/62 | HR 43 | Ht 66.0 in | Wt 151.0 lb

## 2013-04-26 DIAGNOSIS — I498 Other specified cardiac arrhythmias: Secondary | ICD-10-CM

## 2013-04-26 DIAGNOSIS — I10 Essential (primary) hypertension: Secondary | ICD-10-CM

## 2013-04-26 DIAGNOSIS — I471 Supraventricular tachycardia: Secondary | ICD-10-CM

## 2013-04-26 DIAGNOSIS — I2510 Atherosclerotic heart disease of native coronary artery without angina pectoris: Secondary | ICD-10-CM

## 2013-04-26 NOTE — Progress Notes (Signed)
[address], [address] [place], [place]  [address] Phone: [phone] Fax:  [phone]  Date:  04/26/2013   ID:  [name], [name] 06-30-1920, MRN [id number]  PCP:  Kimber Relic, MD  Primary Cardiologist:  Dr. Rollene Rotunda     History of Present Illness: [name] is a 79 y.o. female who returns for follow up after recent admission 4/11-4/12 with SVT c/b AKI.  She has a hx of SVT, CAD, HTN, HL, prior GI bleeding. She was admitted in 03/2009 with a non-STEMI.  LHC at that time demonstrated distal LAD occlusion, distal CFX 60%, OM2 30%, EF 60%.   She presented to the emergency room with dizziness. She was in SVT with a heart rate 160 and noted hypotension (80/60). She converted to NSR with adenosine administration x 1. Initial troponin was mildly elevated. Subsequent troponins were negative thereafter.  She was noted to have an elevated creatinine improved with hydration. Beta blocker dose was adjusted.  Since d/c, she is doing well.  She denies any further palpitations.  No chest pain, dyspnea, syncope, near syncope, orthopnea, PND, edema.  No dizziness or significant fatigue.  Her daughter notes that she had missed several dose of her BB prior to being admitted to the hospital.    Labs (4/14):  K 4.2, Cr 0.91, Hgb 14.1, TSH 4.123  Wt Readings from Last 3 Encounters:  04/26/13 151 lb (68.493 kg)  04/09/13 150 lb 4.8 oz (68.176 kg)  03/29/13 153 lb (69.4 kg)     Past Medical History  Diagnosis Date  . SVT (supraventricular tachycardia)     Diagnosed 2010  . HTN (hypertension)   . Dyslipidemia   . CAD (coronary artery disease)     a. NSTEMI 2010 in setting of SVT, with cath with unsuccessful PCI - chronic total occlusion of LAD, R->L collaterals.  . GI bleeding   . Unspecified hypothyroidism   . Unspecified hereditary and idiopathic peripheral neuropathy   . Pneumonia, organism unspecified   . Basal cell carcinoma of skin of trunk, except scrotum     . Unspecified malignant neoplasm of skin of lower limb, including hip   . Basal cell carcinoma of skin of lower limb, including hip   . Unspecified glaucoma(365.9)     left eye  . Other premature beats   . Other esophagitis   . Esophageal reflux   . Osteoarthrosis, unspecified whether generalized or localized, unspecified site     multiple joints  . Pain in joint, shoulder region   . Pain in joint, lower leg     knee  . Pain in joint, ankle and foot     right  . Sacroiliitis, not elsewhere classified   . Cervicalgia   . Lumbago   . Ganglion of tendon sheath   . Pathologic fracture of vertebrae   . Tietze's disease   . Insomnia, unspecified   . Disturbance of skin sensation   . Edema   . Palpitations   . Other nonspecific abnormal serum enzyme levels   . Nasal bones, closed fracture   . Closed fracture of unspecified trochanteric section of femur   . Open wound of knee, leg (except thigh), and ankle, without mention of complication   . Hip, thigh, leg, and ankle, abrasion or friction burn, without mention of infection   . Contusion of hip   . Fall from other slipping, tripping, or stumbling   . Personal history of fall  Current Outpatient Prescriptions  Medication Sig Dispense Refill  . amlodipine-benazepril (LOTREL) 2.5-10 MG per capsule Take 1 capsule by mouth daily.  30 capsule  12  . aspirin EC 81 MG tablet Take 81 mg by mouth every evening.      Marland Kitchen atorvastatin (LIPITOR) 20 MG tablet Take 20 mg by mouth daily.        . brimonidine (ALPHAGAN) 0.2 % ophthalmic solution Place 1 drop into both eyes 2 (two) times daily.      Marland Kitchen ezetimibe (ZETIA) 10 MG tablet Take 10 mg by mouth daily.        Marland Kitchen imipramine (TOFRANIL) 50 MG tablet Take 50 mg by mouth at bedtime.      . isosorbide mononitrate (IMDUR) 60 MG 24 hr tablet Take 60 mg by mouth every evening.       . latanoprost (XALATAN) 0.005 % ophthalmic solution Place 1 drop into both eyes every evening. UAD      .  levothyroxine (SYNTHROID, LEVOTHROID) 25 MCG tablet Take one tablet once a day for thyroid supplement  30 tablet  5  . metoprolol tartrate (LOPRESSOR) 25 MG tablet Take 1.5 tablets (37.5 mg total) by mouth 2 (two) times daily.  90 tablet  8  . Multiple Vitamin (MULTIVITAMIN WITH MINERALS) TABS Take 1 tablet by mouth daily. Certavite-A      . OVER THE COUNTER MEDICATION Take 1 tablet by mouth at bedtime. Sleep ease from [employer]      . pantoprazole (PROTONIX) 40 MG tablet Take 40 mg by mouth daily.         No current facility-administered medications for this visit.    Allergies:   No Known Allergies  Social History:  The patient  reports that she has never smoked. She has never used smokeless tobacco. She reports that  drinks alcohol. She reports that she does not use illicit drugs.   ROS:  Please see the history of present illness.   All other systems reviewed and negative.   PHYSICAL EXAM: VS:  BP 132/62  Ht 5\' 6"  (1.676 m)  Wt 151 lb (68.493 kg)  BMI 24.38 kg/m2 Well nourished, well developed, in no acute distress HEENT: normal Neck: no JVD at 90 Cardiac:  normal S1, S2; RRR; no murmur Lungs:  clear to auscultation bilaterally, no wheezing, rhonchi or rales Abd: soft, nontender, no hepatomegaly Ext: no edema Skin: warm and dry Neuro:  CNs 2-12 intact, no focal abnormalities noted  EKG:  Sinus bradycardia, HR 43, LBBB     ASSESSMENT AND PLAN:  1. SVT:  No apparent recurrence.  Patient prefers medical Rx.  She seems to be tolerating current dose of beta blocker.  She is not symptomatic with bradycardia. Continue current Rx. 2. Hypertension:  Controlled.  Continue current therapy.  3. CAD:  Stable. Continue ASA and statin. 4. Hyperlipidemia:  Continue statin.   5. Disposition:  F/u with Dr. Rollene Rotunda as planned.   Signed, Tereso Newcomer, PA-C  2:38 PM 04/26/2013

## 2013-04-26 NOTE — Patient Instructions (Addendum)
KEEP UPCOMING APPT WITH DR. HOCHREIN IN June   NO CHANGES WERE MADE TODAY

## 2013-06-08 ENCOUNTER — Ambulatory Visit (INDEPENDENT_AMBULATORY_CARE_PROVIDER_SITE_OTHER): Payer: Medicare Other | Admitting: Cardiology

## 2013-06-08 ENCOUNTER — Encounter: Payer: Self-pay | Admitting: Cardiology

## 2013-06-08 VITALS — BP 125/55 | HR 60 | Ht 65.0 in | Wt 148.8 lb

## 2013-06-08 DIAGNOSIS — I10 Essential (primary) hypertension: Secondary | ICD-10-CM

## 2013-06-08 DIAGNOSIS — I471 Supraventricular tachycardia: Secondary | ICD-10-CM

## 2013-06-08 DIAGNOSIS — E785 Hyperlipidemia, unspecified: Secondary | ICD-10-CM

## 2013-06-08 DIAGNOSIS — I2510 Atherosclerotic heart disease of native coronary artery without angina pectoris: Secondary | ICD-10-CM

## 2013-06-08 NOTE — Patient Instructions (Addendum)
The current medical regimen is effective;  continue present plan and medications.  [phone]  Pam  Follow up in 4 months with Dr Antoine Poche.  You will receive a letter in the mail 2 months before you are due.  Please call us when you receive this letter to schedule your follow up appointment.

## 2013-06-08 NOTE — Progress Notes (Signed)
HPI  The patient presented in April to the ER .  She was in SVT with a heart rate 160 and noted hypotension (80/60). She converted to NSR with adenosine administration x 1. Initial troponin was mildly elevated. Subsequent troponins were negative thereafter. She was noted to have an elevated creatinine improved with hydration.   Since going back to her independent living facility she has had no further symptomatic tachypalpitations. It seems that her episodes have been related to a missed dose of beta blocker. They are not paying service to come in and administer the meds. However, despite this there seems to be a question as to whether she is getting the full dose of beta blocker. We called from the office to clarify and we are waiting for call back.  She denies any chest pain. She's had no neck or arm discomfort. She's had no new shortness of breath, PND or orthopnea.  No Known Allergies  Current Outpatient Prescriptions  Medication Sig Dispense Refill  . amlodipine-benazepril (LOTREL) 2.5-10 MG per capsule Take 1 capsule by mouth daily.  30 capsule  12  . aspirin EC 81 MG tablet Take 81 mg by mouth every evening.      Marland Kitchen atorvastatin (LIPITOR) 20 MG tablet Take 20 mg by mouth daily.        . brimonidine (ALPHAGAN) 0.2 % ophthalmic solution Place 1 drop into both eyes 2 (two) times daily.      Marland Kitchen ezetimibe (ZETIA) 10 MG tablet Take 10 mg by mouth daily.        Marland Kitchen imipramine (TOFRANIL) 50 MG tablet Take 50 mg by mouth at bedtime.      . isosorbide mononitrate (IMDUR) 60 MG 24 hr tablet Take 60 mg by mouth every evening.       . latanoprost (XALATAN) 0.005 % ophthalmic solution Place 1 drop into both eyes every evening. UAD      . levothyroxine (SYNTHROID, LEVOTHROID) 25 MCG tablet Take one tablet once a day for thyroid supplement  30 tablet  5  . metoprolol tartrate (LOPRESSOR) 25 MG tablet Take 1.5 tablets (37.5 mg total) by mouth 2 (two) times daily.  90 tablet  8  . Multiple Vitamin  (MULTIVITAMIN WITH MINERALS) TABS Take 1 tablet by mouth daily. Certavite-A      . OVER THE COUNTER MEDICATION Take 1 tablet by mouth at bedtime. Sleep ease from [employer]      . pantoprazole (PROTONIX) 40 MG tablet Take 40 mg by mouth daily.         No current facility-administered medications for this visit.    Past Medical History  Diagnosis Date  . SVT (supraventricular tachycardia)     Diagnosed 2010  . HTN (hypertension)   . Dyslipidemia   . CAD (coronary artery disease)     a. NSTEMI 2010 in setting of SVT, with cath with unsuccessful PCI - chronic total occlusion of LAD, R->L collaterals.  . GI bleeding   . Unspecified hypothyroidism   . Unspecified hereditary and idiopathic peripheral neuropathy   . Pneumonia, organism unspecified   . Basal cell carcinoma of skin of trunk, except scrotum   . Unspecified malignant neoplasm of skin of lower limb, including hip   . Basal cell carcinoma of skin of lower limb, including hip   . Unspecified glaucoma(365.9)     left eye  . Other premature beats   . Other esophagitis   . Esophageal reflux   . Osteoarthrosis, unspecified whether generalized  or localized, unspecified site     multiple joints  . Pain in joint, shoulder region   . Pain in joint, lower leg     knee  . Pain in joint, ankle and foot     right  . Sacroiliitis, not elsewhere classified   . Cervicalgia   . Lumbago   . Ganglion of tendon sheath   . Pathologic fracture of vertebrae   . Tietze's disease   . Insomnia, unspecified   . Disturbance of skin sensation   . Edema   . Palpitations   . Other nonspecific abnormal serum enzyme levels   . Nasal bones, closed fracture   . Closed fracture of unspecified trochanteric section of femur   . Open wound of knee, leg (except thigh), and ankle, without mention of complication   . Hip, thigh, leg, and ankle, abrasion or friction burn, without mention of infection   . Contusion of hip   . Fall from other slipping,  tripping, or stumbling   . Personal history of fall     Past Surgical History  Procedure Laterality Date  . Hip surgery    . Breast lumpectomy  1995    right breast, negative for malignancy  . Cataract extraction  2003    bilateral    Family History  Problem Relation Age of Onset  . Stroke Neg Hx   . Heart attack Neg Hx   . Heart disease Father   . Cancer Brother     lung   ROS:  As stated in the HPI and negative for all other systems.  PHYSICAL EXAM BP 125/55  Pulse 60  Ht 5\' 5"  (1.651 m)  Wt 148 lb 12.8 oz (67.495 kg)  BMI 24.76 kg/m2 GENERAL:  Well appearing NECK:  No jugular venous distention, waveform within normal limits, carotid upstroke brisk and symmetric, no bruits, no thyromegaly LUNGS:  Clear to auscultation bilaterally CHEST:  Unremarkable HEART:  PMI not displaced or sustained,S1 and S2 within normal limits, no S3, no S4, no clicks, no rubs, no murmurs ABD:  Flat, positive bowel sounds normal in frequency in pitch, no bruits, no rebound, no guarding, no midline pulsatile mass, no hepatomegaly, no splenomegaly EXT:  2 plus pulses throughout, no edema, no cyanosis no clubbing  ASSESSMENT AND PLAN  PSVT -  The patient will continue current regimen. We discussed possible ablation if this becomes a frequently recurrent event.  However, she would like to avoid this.  We will clarify the meds that she is taking.   HYPERTENSION -  Her blood pressure is well controlled on current regimen. No change in therapy is indicated.

## 2013-06-09 ENCOUNTER — Other Ambulatory Visit: Payer: Self-pay | Admitting: *Deleted

## 2013-06-09 MED ORDER — PANTOPRAZOLE SODIUM 40 MG PO TBEC: 40.0000 mg | DELAYED_RELEASE_TABLET | Freq: Every day | ORAL | Status: DC

## 2013-06-10 ENCOUNTER — Other Ambulatory Visit: Payer: Self-pay | Admitting: *Deleted

## 2013-06-10 MED ORDER — PANTOPRAZOLE SODIUM 40 MG PO TBEC: DELAYED_RELEASE_TABLET | ORAL | Status: DC

## 2013-06-23 ENCOUNTER — Encounter: Payer: Self-pay | Admitting: *Deleted

## 2013-06-28 ENCOUNTER — Non-Acute Institutional Stay: Payer: Medicare Other | Admitting: Internal Medicine

## 2013-06-28 ENCOUNTER — Encounter: Payer: Self-pay | Admitting: Internal Medicine

## 2013-06-28 VITALS — BP 120/62 | HR 62 | Ht 65.0 in | Wt 149.0 lb

## 2013-06-28 DIAGNOSIS — I10 Essential (primary) hypertension: Secondary | ICD-10-CM

## 2013-06-28 DIAGNOSIS — E039 Hypothyroidism, unspecified: Secondary | ICD-10-CM

## 2013-06-28 DIAGNOSIS — E785 Hyperlipidemia, unspecified: Secondary | ICD-10-CM

## 2013-06-28 DIAGNOSIS — I2510 Atherosclerotic heart disease of native coronary artery without angina pectoris: Secondary | ICD-10-CM

## 2013-06-28 DIAGNOSIS — R351 Nocturia: Secondary | ICD-10-CM

## 2013-06-28 DIAGNOSIS — M6740 Ganglion, unspecified site: Secondary | ICD-10-CM

## 2013-06-28 DIAGNOSIS — R739 Hyperglycemia, unspecified: Secondary | ICD-10-CM | POA: Insufficient documentation

## 2013-06-28 DIAGNOSIS — I471 Supraventricular tachycardia: Secondary | ICD-10-CM

## 2013-06-28 DIAGNOSIS — R7989 Other specified abnormal findings of blood chemistry: Secondary | ICD-10-CM

## 2013-06-28 NOTE — Progress Notes (Signed)
Subjective:    [name], female    DOB: September 30, 1920, 79 y.o.   MRN: [id number]  HPI Ganglion of tendon sheath: Patient has a rather large ganglion on the palmar surface of the right digit proximal phalanx. She says the finger gets numbness and tingling at times. The ganglion was large enough to interfere with closing her fifth finger. She would like a referral to consider getting it fixed.  PSVT: Hospitalized in April 2014. Has seen Dr. Antoine Poche 06/08/2013 who are so low understanding of that of a change for 6:30 zone into this and the 12th of this in the urine of 30 minutes or so disturbance in addition some of his meds several admissions for this to thought she was doing well.  CORONARY ATHEROSCLEROSIS NATIVE CORONARY ARTERY: Denies angina or chest pain. She does get short of breath when walking from time to time.  HYPERTENSION: Controlled on current medication  DYSLIPIDEMIA: Patient has been under good control. This is to be rechecked prior to her next visit.  Other abnormal blood chemistry: Hyperglycemia. We're simply following this with usual lab.  Unspecified hypothyroidism: Controlled on a small dose of levothyroxine 25 mcg daily.  Nocturia: Continues to have concerns about the frequency of nocturnal urination and her ankles are no longer swollen. She has no ascites. She says she must get up 3, 4, and even 5 times nightly. She denies dysuria.  Current Outpatient Prescriptions on File Prior to Visit  Medication Sig Dispense Refill  . amlodipine-benazepril (LOTREL) 2.5-10 MG per capsule Take 1 capsule by mouth daily.  30 capsule  12  . aspirin EC 81 MG tablet Take 81 mg by mouth every evening. Take 1 tablet daily to help prevent stroke, heart attack and blood clot.      Marland Kitchen atorvastatin (LIPITOR) 20 MG tablet Take 20 mg by mouth daily. Take 1 tablet daily to lower lipids.      . brimonidine (ALPHAGAN) 0.2 % ophthalmic solution Place 1 drop into both eyes 2 (two)  times daily.      Marland Kitchen ezetimibe (ZETIA) 10 MG tablet Take 10 mg by mouth daily. Take 1 tablet daily for cholesterol.      Marland Kitchen imipramine (TOFRANIL) 50 MG tablet Take 50 mg by mouth at bedtime.      . isosorbide mononitrate (IMDUR) 60 MG 24 hr tablet Take 60 mg by mouth every evening. Take 1 tablet daily for chest pain.      Marland Kitchen latanoprost (XALATAN) 0.005 % ophthalmic solution Place 1 drop into both eyes every evening. UAD      . levothyroxine (SYNTHROID, LEVOTHROID) 25 MCG tablet Take one tablet once a day for thyroid supplement  30 tablet  5  . metoprolol tartrate (LOPRESSOR) 25 MG tablet Take 1.5 tablets (37.5 mg total) by mouth 2 (two) times daily.  90 tablet  8  . Multiple Vitamin (MULTIVITAMIN WITH MINERALS) TABS Take 1 tablet by mouth daily. Certavite-A      . OVER THE COUNTER MEDICATION Take 1 tablet by mouth at bedtime. Sleep ease from [employer]      . pantoprazole (PROTONIX) 40 MG tablet Take one tablet once a day for acid reflux  30 tablet  5         Review of Systems  Constitutional: Negative for fever, chills, weight loss, malaise/fatigue and diaphoresis.  HENT: Positive for hearing loss. Negative for congestion, sore throat and neck pain.   Eyes: Negative for blurred vision and double vision.  Respiratory: Negative for cough, sputum production and shortness of breath.   Cardiovascular: Negative for chest pain, leg swelling and PND.  Gastrointestinal: Negative for heartburn, nausea, diarrhea and constipation.  Genitourinary: Positive for frequency. Negative for dysuria, urgency, hematuria and flank pain.  Musculoskeletal: Negative for myalgias and back pain.  Skin: Negative for itching and rash.  Neurological: Negative for tingling, tremors, sensory change, speech change, focal weakness, seizures, weakness and headaches.  Hematological: Bruises/bleeds easily.  Psychiatric/Behavioral: Positive for memory loss. Negative for depression. The patient does not have insomnia.         Objective:BP 120/62  Pulse 62  Ht 5\' 5"  (1.651 m)  Wt 149 lb (67.586 kg)  BMI 24.79 kg/m2    Physical Exam  Constitutional: She appears well-developed and well-nourished.  HENT:  Head: Normocephalic.  Eyes: Conjunctivae are normal. Pupils are equal, round, and reactive to light.  Neck: Normal range of motion. No JVD present. No tracheal deviation present. No thyromegaly present.  Cardiovascular: Normal rate, regular rhythm, normal heart sounds and intact distal pulses.   Pulmonary/Chest: Effort normal and breath sounds normal.  Abdominal: Bowel sounds are normal. She exhibits no mass. There is no tenderness. There is no guarding.  Musculoskeletal: Normal range of motion. She exhibits no edema.  Large ganglion of the proximal phalanx of the right fifth digit. Some restriction in full flexion.  Lymphadenopathy:    She has no cervical adenopathy.  Neurological: She is alert. She has normal reflexes. No cranial nerve deficit. Coordination normal.  Skin: No rash noted. No erythema.  Psychiatric: She has a normal mood and affect. Her behavior is normal. Judgment and thought content normal.    No visits with results within 2 Month(s) from this visit. Latest known visit with results is:  Admission on 04/09/2013, Discharged on 04/10/2013  Component Date Value Range Status  . Color, Urine 04/09/2013 YELLOW  YELLOW Final  . APPearance 04/09/2013 CLOUDY* CLEAR Final  . Specific Gravity, Urine 04/09/2013 1.012  1.005 - 1.030 Final  . pH 04/09/2013 5.5  5.0 - 8.0 Final  . Glucose, UA 04/09/2013 NEGATIVE  NEGATIVE mg/dL Final  . Hgb urine dipstick 04/09/2013 NEGATIVE  NEGATIVE Final  . Bilirubin Urine 04/09/2013 NEGATIVE  NEGATIVE Final  . Ketones, ur 04/09/2013 NEGATIVE  NEGATIVE mg/dL Final  . Protein, ur 96/29/5284 NEGATIVE  NEGATIVE mg/dL Final  . Urobilinogen, UA 04/09/2013 0.2  0.0 - 1.0 mg/dL Final  . Nitrite 13/24/4010 NEGATIVE  NEGATIVE Final  . Leukocytes, UA 04/09/2013 SMALL*  NEGATIVE Final  . Specimen Description 04/09/2013 URINE, CLEAN CATCH   Final  . Special Requests 04/09/2013 Normal   Final  . Culture  Setup Time 04/09/2013 04/10/2013 04:12   Final  . Colony Count 04/09/2013 30,000 COLONIES/ML   Final  . Culture 04/09/2013 Multiple bacterial morphotypes present, none predominant. Suggest appropriate recollection if clinically indicated.   Final  . Report Status 04/09/2013 04/11/2013 FINAL   Final  . Sodium 04/09/2013 140  135 - 145 mEq/L Final  . Potassium 04/09/2013 4.4  3.5 - 5.1 mEq/L Final  . Chloride 04/09/2013 104  96 - 112 mEq/L Final  . BUN 04/09/2013 27* 6 - 23 mg/dL Final  . Creatinine, Ser 04/09/2013 1.30* 0.50 - 1.10 mg/dL Final  . Glucose, Bld 27/25/3664 154* 70 - 99 mg/dL Final  . Calcium, Ion 40/34/7425 1.12* 1.13 - 1.30 mmol/L Final  . TCO2 04/09/2013 27  0 - 100 mmol/L Final  . Hemoglobin 04/09/2013 18.4* 12.0 - 15.0 g/dL  Final  . HCT 04/09/2013 54.0* 36.0 - 46.0 % Final  . Troponin i, poc 04/09/2013 0.10* 0.00 - 0.08 ng/mL Final  . Comment 04/09/2013 NOTIFIED PHYSICIAN   Final  . Comment 3 04/09/2013          Final   Comment: Due to the release kinetics of cTnI,                          a negative result within the first hours                          of the onset of symptoms does not rule out                          myocardial infarction with certainty.                          If myocardial infarction is still suspected,                          repeat the test at appropriate intervals.  Marland Kitchen TSH 04/09/2013 4.123  0.350 - 4.500 uIU/mL Final  . Sodium 04/09/2013 138  135 - 145 mEq/L Final  . Potassium 04/09/2013 4.2  3.5 - 5.1 mEq/L Final  . Chloride 04/09/2013 102  96 - 112 mEq/L Final  . CO2 04/09/2013 27  19 - 32 mEq/L Final  . Glucose, Bld 04/09/2013 160* 70 - 99 mg/dL Final  . BUN 91/47/8295 25* 6 - 23 mg/dL Final  . Creatinine, Ser 04/09/2013 1.04  0.50 - 1.10 mg/dL Final  . Calcium 62/13/0865 9.2  8.4 - 10.5 mg/dL Final  .  GFR calc non Af Amer 04/09/2013 46* >90 mL/min Final  . GFR calc Af Amer 04/09/2013 53* >90 mL/min Final   Comment:                                 The eGFR has been calculated                          using the CKD EPI equation.                          This calculation has not been                          validated in all clinical                          situations.                          eGFR's persistently                          <90 mL/min signify                          possible Chronic Kidney Disease.  . WBC 04/09/2013 8.7  4.0 - 10.5 K/uL Final  . RBC 04/09/2013 4.83  3.87 - 5.11 MIL/uL Final  .  Hemoglobin 04/09/2013 16.1* 12.0 - 15.0 g/dL Final  . HCT 81/19/1478 46.9* 36.0 - 46.0 % Final  . MCV 04/09/2013 97.1  78.0 - 100.0 fL Final  . MCH 04/09/2013 33.3  26.0 - 34.0 pg Final  . MCHC 04/09/2013 34.3  30.0 - 36.0 g/dL Final  . RDW 29/56/2130 13.1  11.5 - 15.5 % Final  . Platelets 04/09/2013 174  150 - 400 K/uL Final  . Neutrophils Relative % 04/09/2013 74  43 - 77 % Final  . Neutro Abs 04/09/2013 6.4  1.7 - 7.7 K/uL Final  . Lymphocytes Relative 04/09/2013 21  12 - 46 % Final  . Lymphs Abs 04/09/2013 1.8  0.7 - 4.0 K/uL Final  . Monocytes Relative 04/09/2013 5  3 - 12 % Final  . Monocytes Absolute 04/09/2013 0.5  0.1 - 1.0 K/uL Final  . Eosinophils Relative 04/09/2013 0  0 - 5 % Final  . Eosinophils Absolute 04/09/2013 0.0  0.0 - 0.7 K/uL Final  . Basophils Relative 04/09/2013 0  0 - 1 % Final  . Basophils Absolute 04/09/2013 0.0  0.0 - 0.1 K/uL Final  . Free T4 04/09/2013 1.20  0.80 - 1.80 ng/dL Final  . Troponin I 86/57/8469 <0.30  <0.30 ng/mL Final   Comment:                                 Due to the release kinetics of cTnI,                          a negative result within the first hours                          of the onset of symptoms does not rule out                          myocardial infarction with certainty.                          If myocardial  infarction is still suspected,                          repeat the test at appropriate intervals.  . Troponin I 04/09/2013 <0.30  <0.30 ng/mL Final   Comment:                                 Due to the release kinetics of cTnI,                          a negative result within the first hours                          of the onset of symptoms does not rule out                          myocardial infarction with certainty.                          If myocardial infarction is still suspected,  repeat the test at appropriate intervals.  . Troponin I 04/10/2013 <0.30  <0.30 ng/mL Final   Comment:                                 Due to the release kinetics of cTnI,                          a negative result within the first hours                          of the onset of symptoms does not rule out                          myocardial infarction with certainty.                          If myocardial infarction is still suspected,                          repeat the test at appropriate intervals.  . WBC 04/10/2013 8.2  4.0 - 10.5 K/uL Final  . RBC 04/10/2013 4.30  3.87 - 5.11 MIL/uL Final  . Hemoglobin 04/10/2013 14.1  12.0 - 15.0 g/dL Final  . HCT 40/98/1191 41.5  36.0 - 46.0 % Final  . MCV 04/10/2013 96.5  78.0 - 100.0 fL Final  . MCH 04/10/2013 32.8  26.0 - 34.0 pg Final  . MCHC 04/10/2013 34.0  30.0 - 36.0 g/dL Final  . RDW 47/82/9562 13.2  11.5 - 15.5 % Final  . Platelets 04/10/2013 172  150 - 400 K/uL Final  . Sodium 04/10/2013 138  135 - 145 mEq/L Final  . Potassium 04/10/2013 4.2  3.5 - 5.1 mEq/L Final  . Chloride 04/10/2013 104  96 - 112 mEq/L Final  . CO2 04/10/2013 29  19 - 32 mEq/L Final  . Glucose, Bld 04/10/2013 83  70 - 99 mg/dL Final  . BUN 13/07/6577 23  6 - 23 mg/dL Final  . Creatinine, Ser 04/10/2013 0.91  0.50 - 1.10 mg/dL Final  . Calcium 46/96/2952 9.1  8.4 - 10.5 mg/dL Final  . GFR calc non Af Amer 04/10/2013 54* >90 mL/min Final  . GFR calc Af  Amer 04/10/2013 62* >90 mL/min Final   Comment:                                 The eGFR has been calculated                          using the CKD EPI equation.                          This calculation has not been                          validated in all clinical                          situations.  eGFR's persistently                          <90 mL/min signify                          possible Chronic Kidney Disease.  Marland Kitchen Specimen Description 04/09/2013 URINE, RANDOM   Final  . Special Requests 04/09/2013 NONE   Final  . Culture  Setup Time 04/09/2013 04/09/2013 18:40   Final  . Colony Count 04/09/2013 >=100,000 COLONIES/ML   Final  . Culture 04/09/2013 Multiple bacterial morphotypes present, none predominant. Suggest appropriate recollection if clinically indicated.   Final  . Report Status 04/09/2013 04/10/2013 FINAL   Final  . Squamous Epithelial / LPF 04/09/2013 RARE  RARE Final  . WBC, UA 04/09/2013 7-10  <3 WBC/hpf Final  . RBC / HPF 04/09/2013 0-2  <3 RBC/hpf Final  . Bacteria, UA 04/09/2013 RARE  RARE Final  . Casts 04/09/2013 HYALINE CASTS* NEGATIVE Final       Assessment & Plan:  Ganglion of tendon sheath - Plan: Ambulatory referral to Orthopedic Surgery  PSVT: Stable at this time; pulse rate seems regular  CORONARY ATHEROSCLEROSIS NATIVE CORONARY ARTERY: No angina. Some shortness of breath which could be related to cardiac disease, but I suspect is more a function of aging.  HYPERTENSION : Controlled on current medication  - Plan: Comprehensive metabolic panel  DYSLIPIDEMIA: Controlled   - Plan: Lipid panel  Other abnormal blood chemistry: Hyperglycemia will need further followup with routine lab prior to next visit.  Unspecified hypothyroidism: Controlled on current medication   - Plan: TSH  Nocturia: I don't have a satisfactory answer for this problem. She is not desirous urologic referral at this time. She is not in congestive heart  failure and does not seem to have any excessive fluid retention problems.

## 2013-06-28 NOTE — Patient Instructions (Signed)
Continue current medications. 

## 2013-06-29 ENCOUNTER — Telehealth: Payer: Self-pay

## 2013-06-29 NOTE — Telephone Encounter (Signed)
Called patient about  appt with Dr. Amanda Pea for finger, August 25th at 9:30, [address] [employer]. Patient aware. Kaylyn Layer, CMA

## 2013-08-03 ENCOUNTER — Encounter: Payer: Self-pay | Admitting: Geriatric Medicine

## 2013-08-03 ENCOUNTER — Other Ambulatory Visit: Payer: Self-pay | Admitting: Internal Medicine

## 2013-08-03 ENCOUNTER — Non-Acute Institutional Stay: Payer: Medicare Other | Admitting: Geriatric Medicine

## 2013-08-03 DIAGNOSIS — S20219A Contusion of unspecified front wall of thorax, initial encounter: Secondary | ICD-10-CM

## 2013-08-03 DIAGNOSIS — S298XXA Other specified injuries of thorax, initial encounter: Secondary | ICD-10-CM | POA: Insufficient documentation

## 2013-08-03 DIAGNOSIS — S20212A Contusion of left front wall of thorax, initial encounter: Secondary | ICD-10-CM

## 2013-08-03 HISTORY — DX: Contusion of left front wall of thorax, initial encounter: S20.212A
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
## 2013-08-11 ENCOUNTER — Non-Acute Institutional Stay: Payer: Medicare Other | Admitting: Geriatric Medicine

## 2013-08-11 ENCOUNTER — Encounter: Payer: Self-pay | Admitting: Geriatric Medicine

## 2013-08-11 VITALS — BP 90/54 | HR 68 | Temp 97.0°F

## 2013-08-11 DIAGNOSIS — R079 Chest pain, unspecified: Secondary | ICD-10-CM

## 2013-08-11 DIAGNOSIS — I959 Hypotension, unspecified: Secondary | ICD-10-CM | POA: Insufficient documentation

## 2013-08-11 DIAGNOSIS — R112 Nausea with vomiting, unspecified: Secondary | ICD-10-CM | POA: Insufficient documentation

## 2013-08-11 NOTE — Assessment & Plan Note (Signed)
Episode of chest pain early this morning followed by hypotension and tachycardia. May be related to missed dose of metoprolol. Chack lab  rule out dehydration/or electrolyte imbalance

## 2013-08-11 NOTE — Assessment & Plan Note (Signed)
Episode of hypotension and tachycardia this morning also chest pain earlier in the morning. EKG is negative for any acute changes. Patient did miss her BP medicines last night most likely did not take nighttime metoprolol. Also with episodes of weakness and one vomiting episode this morning along with bladder tenderness. Symptoms together might think indicate urinary tract infection, dehydration is also a possibility. Will check laboratory studies and urinalysis.

## 2013-08-11 NOTE — Assessment & Plan Note (Signed)
One episode today this along with other symptoms above may indicate UTI. Obtain urine for analysis and culture

## 2013-08-11 NOTE — Progress Notes (Signed)
[name], female   DOB: 1920-08-07, 79 y.o.   MRN: [id number] [facility] [phone])  Code Status: Full Code      Contact Information   Name Relation Home Work Mobile   [place] & Christen Butter [phone]   [name],[name] Daughter [phone]  [phone]   Greener,[name] Son  [phone]   [name] Daughter [phone]        Chief Complaint  Patient presents with  . Weakness    since yesterday, heart racing, vomited once this morning. Had left chest pain before getting out of bed. Forgot to take medication last night.     HPI: This is a 79 y.o. female resident of [facility]  section.  Clinic nurse requested evaluation today due to hypotension, tachycardia, blood pressure 82/58, pulse range 137-87.  Patient reported that she forgot to take her medicine last night, unclear which pill she is talking about (is prescribed metoprolol and imipramine at bedtime). Patient further reports that yesterday he felt rather ill in the morning felt better by noontime. Noticed her heart racing early this morning and experienced some substernal chest pain. This resolved without any intervention. Felt weak and vomited once this morning.   No Known Allergies Medications - managed by [facility]   Medication List       This list is accurate as of: 08/11/13 10:32 AM.  Always use your most recent med list.               amlodipine-benazepril 2.5-10 MG per capsule  Commonly known as:  LOTREL  Take 1 capsule by mouth daily.     aspirin EC 81 MG tablet  Take 81 mg by mouth every evening. Take 1 tablet daily to help prevent stroke, heart attack and blood clot.     atorvastatin 20 MG tablet  Commonly known as:  LIPITOR  Take 20 mg by mouth daily. Take 1 tablet daily to lower lipids.     brimonidine 0.2 % ophthalmic solution  Commonly known as:  ALPHAGAN  Place 1  drop into both eyes 2 (two) times daily.     imipramine 25 MG tablet  Commonly known as:  TOFRANIL  TAKE TWO TABLETS BY MOUTH AT BEDTIME TO REDUCE URINARY FREQUENCY     isosorbide mononitrate 60 MG 24 hr tablet  Commonly known as:  IMDUR  TAKE ONE TABLET BY MOUTH ONE   TIME DAILY     latanoprost 0.005 % ophthalmic solution  Commonly known as:  XALATAN  Place 1 drop into both eyes every evening. UAD     levothyroxine 25 MCG tablet  Commonly known as:  SYNTHROID, LEVOTHROID  Take one tablet once a day for thyroid supplement     metoprolol tartrate 25 MG tablet  Commonly known as:  LOPRESSOR  Take 1.5 tablets (37.5 mg total) by mouth 2 (two) times daily.     multivitamin with minerals Tabs tablet  Take 1 tablet by mouth daily. Certavite-A     OVER THE COUNTER MEDICATION  Take 1 tablet by mouth at bedtime. Sleep ease from [employer]     pantoprazole 40 MG tablet  Commonly known as:  PROTONIX  Take one tablet once a day for acid reflux     ZETIA 10 MG tablet  Generic drug:  ezetimibe  TAKE 1 TABLET BY MOUTH DAILY  TO CONTROL LIPIDS          Review of Systems  DATA OBTAINED: from patient,  GENERAL: Feels OK now, hungry.  SKIN: No itch, rash EYES: No eye pain, dryness or itching  No change in vision EARS: No earache, tinnitus, change in hearing NOSE: No congestion, drainage or bleeding MOUTH/THROAT: No mouth or tooth pain  No sore throat No difficulty chewing or swallowing RESPIRATORY: No cough, wheezing, SOB CARDIAC: Chest pain early this AM, none since. No palpitations  No edema. GI: No abdominal pain  Vomiting this AM, no constipation  No heartburn or reflux  GU: No dysuria, frequency or urgency  No change in urine volume or character  Nocturia x 2, not new MUSCULOSKELETAL:  No joint pain, swelling or stiffness  No back pain  No muscle ache, pain, weakness     NEUROLOGIC: No dizziness, fainting, headache,   No change in mental status.  PSYCHIATRIC: Denies feelings of  anxiety, depression Sleeps well.      Physical Exam Filed Vitals:   08/11/13 1004  BP: 90/54  Pulse: 68  Temp: 97 F (36.1 C)  TempSrc: Oral  SpO2: 91%   GENERAL APPEARANCE: No acute distress, appropriately groomed, normal body habitus. Alert, pleasant, conversant. SKIN: No diaphoresis, rash,  Skin very tanned, Multiple sun induced keratoses.Thin skin. HEAD: Normocephalic, atraumatic EYES: Conjunctiva/lids clear. Pupils round, reactive. EOMs intact.  EARS: External exam WNL Decreased Hearing   NOSE: No deformity or discharge. MOUTH/THROAT: Lips w/o lesions. Oral mucosa, tongue moist, w/o lesion. Oropharynx w/o redness or lesions.  NECK: Supple, full ROM. No thyroid tenderness, enlargement or nodule LYMPHATICS: No head, neck or supraclavicular adenopathy RESPIRATORY: Breathing is even, unlabored. Lung sounds are clear and full.  CARDIOVASCULAR: Heart RRR. No murmur or extra heart sounds  EKG: SR 68BPM. QRS(T) abnormality (unchnaged since 03/2013)  ARTERIAL: No carotid bruit. Marland Kitchen  EDEMA: No peripheral or periorbital edema. No ascites GASTROINTESTINAL: Abdomen is soft, non-tender, not distended w/ normal bowel sounds.  GENITOURINARY: Suprapubic tenderness. MUSCULOSKELETAL: Moves all extremities with full ROM, strength and tone. Back is without kyphosis, scoliosis or spinal process tenderness. Gait is very unsteady, short steps NEUROLOGIC: Oriented to time, place, person. Speech clear, no tremor.   PSYCHIATRIC: Mood and affect appropriate to situation  ASSESSMENT/PLAN  Hypotension, unspecified Episode of hypotension and tachycardia this morning also chest pain earlier in the morning. EKG is negative for any acute changes. Patient did miss her BP medicines last night most likely did not take nighttime metoprolol. Also with episodes of weakness and one vomiting episode this morning along with bladder tenderness. Symptoms together might think indicate urinary tract infection, dehydration is  also a possibility. Will check laboratory studies and urinalysis.  Nausea with vomiting One episode today this along with other symptoms above may indicate UTI. Obtain urine for analysis and culture  Chest pain  Episode of chest pain early this morning followed by hypotension and tachycardia. May be related to missed dose of metoprolol. [facility]ack lab  rule out dehydration/or electrolyte imbalance   Follow up: As scheduled in clinic w/ Dr..Green  Kayra Crowell T.Ayane Delancey, NP-C 08/11/2013

## 2013-09-20 ENCOUNTER — Other Ambulatory Visit: Payer: Self-pay | Admitting: Internal Medicine

## 2013-09-20 ENCOUNTER — Other Ambulatory Visit: Payer: Self-pay | Admitting: Nurse Practitioner

## 2013-09-21 ENCOUNTER — Other Ambulatory Visit: Payer: Self-pay | Admitting: Geriatric Medicine

## 2013-09-21 LAB — TSH: TSH: 11.96 u[IU]/mL — AB (ref 0.41–5.90)

## 2013-09-21 MED ORDER — LEVOTHYROXINE SODIUM 50 MCG PO TABS: 50.0000 ug | ORAL_TABLET | Freq: Every day | ORAL | Status: DC

## 2013-09-27 ENCOUNTER — Non-Acute Institutional Stay: Payer: Medicare Other | Admitting: Internal Medicine

## 2013-09-27 ENCOUNTER — Encounter: Payer: Self-pay | Admitting: Internal Medicine

## 2013-09-27 VITALS — BP 108/56 | HR 60 | Ht 65.0 in | Wt 148.0 lb

## 2013-09-27 DIAGNOSIS — R7309 Other abnormal glucose: Secondary | ICD-10-CM

## 2013-09-27 DIAGNOSIS — E039 Hypothyroidism, unspecified: Secondary | ICD-10-CM

## 2013-09-27 DIAGNOSIS — R739 Hyperglycemia, unspecified: Secondary | ICD-10-CM

## 2013-09-27 DIAGNOSIS — I10 Essential (primary) hypertension: Secondary | ICD-10-CM

## 2013-09-27 DIAGNOSIS — I2510 Atherosclerotic heart disease of native coronary artery without angina pectoris: Secondary | ICD-10-CM

## 2013-09-27 DIAGNOSIS — M6740 Ganglion, unspecified site: Secondary | ICD-10-CM

## 2013-09-27 DIAGNOSIS — E785 Hyperlipidemia, unspecified: Secondary | ICD-10-CM

## 2013-09-27 NOTE — Patient Instructions (Signed)
Continue current medications. 

## 2013-09-27 NOTE — Progress Notes (Signed)
Subjective:    [name], female    DOB: [birth year], 79 y.o.   MRN: [id number]  HPI HYPERTENSION: Stable  CORONARY ATHEROSCLEROSIS NATIVE CORONARY ARTERY: chest pain infrequently  Hypothyroidism: recently had TSH >11. Levothyroxine was doubled to 50 mcg qd  Ganglion of tendon sheath: she showed upo too early to ortho. Did not reschedule the visit  DYSLIPIDEMIA: controlled  Hyperglycemia; rising glucose. Monitor with lab in 3 months.    Current Outpatient Prescriptions on File Prior to Visit  Medication Sig Dispense Refill  . amlodipine-benazepril (LOTREL) 2.5-10 MG per capsule Take 1 capsule by mouth daily.  30 capsule  12  . aspirin EC 81 MG tablet Take 81 mg by mouth every evening. Take 1 tablet daily to help prevent stroke, heart attack and blood clot.      Marland Kitchen atorvastatin (LIPITOR) 20 MG tablet TAKE  ONE TABLET BY MOUTH DAILY TO LOWER LIPIDS  30 tablet  5  . brimonidine (ALPHAGAN) 0.2 % ophthalmic solution Place 1 drop into both eyes 2 (two) times daily.      Marland Kitchen imipramine (TOFRANIL) 25 MG tablet TAKE TWO TABLETS BY MOUTH AT BEDTIME TO REDUCE URINARY FREQUENCY  60 tablet  2  . isosorbide mononitrate (IMDUR) 60 MG 24 hr tablet TAKE ONE TABLET BY MOUTH ONE   TIME DAILY  30 tablet  3  . latanoprost (XALATAN) 0.005 % ophthalmic solution Place 1 drop into both eyes every evening. UAD      . levothyroxine (SYNTHROID, LEVOTHROID) 50 MCG tablet Take 1 tablet (50 mcg total) by mouth daily.  90 tablet  3  . metoprolol tartrate (LOPRESSOR) 25 MG tablet Take 1.5 tablets (37.5 mg total) by mouth 2 (two) times daily.  90 tablet  8  . Multiple Vitamin (MULTIVITAMIN WITH MINERALS) TABS Take 1 tablet by mouth daily. Certavite-A      . OVER THE COUNTER MEDICATION Take 1 tablet by mouth at bedtime. Sleep ease from [employer]      . pantoprazole (PROTONIX) 40 MG tablet Take one tablet once a day for acid reflux  30 tablet  5  . ZETIA 10 MG tablet TAKE 1 TABLET BY MOUTH DAILY  TO  CONTROL LIPIDS  30 tablet  5   No current facility-administered medications on file prior to visit.    Review of Systems  Constitutional: Negative for fever, chills, weight loss, malaise/fatigue and diaphoresis.  HENT: Positive for hearing loss. Negative for congestion, sore throat and neck pain.   Eyes: Negative for blurred vision and double vision.  Respiratory: Negative for cough, sputum production and shortness of breath.   Cardiovascular: Negative for chest pain, leg swelling and PND.  Gastrointestinal: Negative for heartburn, nausea, diarrhea and constipation.  Genitourinary: Positive for frequency. Negative for dysuria, urgency, hematuria and flank pain.  Musculoskeletal: Negative for myalgias and back pain.  Skin: Negative for itching and rash.  Neurological: Negative for tingling, tremors, sensory change, speech change, focal weakness, seizures, weakness and headaches.  Hematological: Bruises/bleeds easily.  Psychiatric/Behavioral: Positive for memory loss. Negative for depression. The patient does not have insomnia.        Objective:BP 108/56  Pulse 60  Ht 5\' 5"  (1.651 m)  Wt 148 lb (67.132 kg)  BMI 24.63 kg/m2    Physical Exam  Constitutional: She appears well-developed and well-nourished.  HENT:  Head: Normocephalic.  Eyes: Conjunctivae are normal. Pupils are equal, round, and reactive to light.  Neck: Normal range of motion. No JVD present.  No tracheal deviation present. No thyromegaly present.  Cardiovascular: Normal rate, regular rhythm, normal heart sounds and intact distal pulses.   Pulmonary/Chest: Effort normal and breath sounds normal.  Abdominal: Bowel sounds are normal. She exhibits no mass. There is no tenderness. There is no guarding.  Musculoskeletal: Normal range of motion. She exhibits no edema.  Large ganglion of the proximal phalanx of the right fifth digit. Some restriction in full flexion.  Lymphadenopathy:    She has no cervical adenopathy.   Neurological: She is alert. She has normal reflexes. No cranial nerve deficit. Coordination normal.  Skin: No rash noted. No erythema.  Psychiatric: She has a normal mood and affect. Her behavior is normal. Judgment and thought content normal.     LABS REVIEWED 08/11/13 CBC: hgb 13.6, WBC 10,700,, MCV 100.7  BMP: glu 125, BUN 24, creat 1.15  TSH  5.751  Urine: UA normal.Culture 70,000 colonies enterococcus 09/21/13 CMP: Glu 168, BUN 20, Creat 1.02  Liipds: TC 157, trig 189, HDL 40, LDL 79     Assessment & Plan:

## 2013-09-29 ENCOUNTER — Other Ambulatory Visit: Payer: Self-pay | Admitting: Internal Medicine

## 2013-10-11 ENCOUNTER — Encounter: Payer: Self-pay | Admitting: Internal Medicine

## 2013-10-12 ENCOUNTER — Ambulatory Visit (INDEPENDENT_AMBULATORY_CARE_PROVIDER_SITE_OTHER): Payer: Medicare Other | Admitting: Cardiology

## 2013-10-12 ENCOUNTER — Encounter: Payer: Self-pay | Admitting: Cardiology

## 2013-10-12 VITALS — BP 132/78 | HR 58 | Ht 66.0 in | Wt 147.0 lb

## 2013-10-12 DIAGNOSIS — I2510 Atherosclerotic heart disease of native coronary artery without angina pectoris: Secondary | ICD-10-CM

## 2013-10-12 DIAGNOSIS — I10 Essential (primary) hypertension: Secondary | ICD-10-CM

## 2013-10-12 DIAGNOSIS — I471 Supraventricular tachycardia: Secondary | ICD-10-CM

## 2013-10-12 NOTE — Patient Instructions (Signed)
The current medical regimen is effective;  continue present plan and medications.  Follow up in 1 year with Dr Hochrein.  You will receive a letter in the mail 2 months before you are due.  Please call us when you receive this letter to schedule your follow up appointment.  
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
## 2013-10-18 ENCOUNTER — Other Ambulatory Visit: Payer: Self-pay | Admitting: *Deleted

## 2013-10-18 ENCOUNTER — Telehealth: Payer: Self-pay | Admitting: *Deleted

## 2013-10-18 MED ORDER — PANTOPRAZOLE SODIUM 40 MG PO TBEC: DELAYED_RELEASE_TABLET | ORAL | Status: DC

## 2013-10-18 NOTE — Telephone Encounter (Signed)
Spoke to Chilton @ [facility] regarding a PA on Pantoprazole 40 mg.  Medication will not be covered under pt's Medicare Plan.  Information faxed back to [facility].

## 2013-11-01 ENCOUNTER — Encounter: Payer: Self-pay | Admitting: Internal Medicine

## 2013-11-01 ENCOUNTER — Non-Acute Institutional Stay: Payer: Medicare Other | Admitting: Internal Medicine

## 2013-11-01 VITALS — BP 152/74 | HR 68 | Temp 95.7°F | Wt 148.0 lb

## 2013-11-01 DIAGNOSIS — R739 Hyperglycemia, unspecified: Secondary | ICD-10-CM

## 2013-11-01 DIAGNOSIS — R7309 Other abnormal glucose: Secondary | ICD-10-CM

## 2013-11-01 DIAGNOSIS — E039 Hypothyroidism, unspecified: Secondary | ICD-10-CM

## 2013-11-01 DIAGNOSIS — I471 Supraventricular tachycardia: Secondary | ICD-10-CM

## 2013-11-01 DIAGNOSIS — R112 Nausea with vomiting, unspecified: Secondary | ICD-10-CM

## 2013-11-01 DIAGNOSIS — I10 Essential (primary) hypertension: Secondary | ICD-10-CM

## 2013-11-01 NOTE — Progress Notes (Signed)
Subjective:    [name], female    DOB: February 05, 1920, 79 y.o.   MRN: [id number]  Chief Complaint  Patient presents with  . Weakness    today, vomited this morning, constipated. Felt ok yesterday  . Medication Management    Note from [facility] [name]: patient missed her medications last night as well as last Thursday night prior to not feeling well both times. Mrs. [name] has been resistant to care and sometimes send the caregiver away without letting them in.     HPI Sudden episode of vomiting X 1 at 9 AM at [facility]. No vomiting since. Constipated. Did not feel hot or flushed. Was able to eat lunch. Was weak initially, but is better now.  Had some palpitations initially. She has forgotten to take medications in the past and perhaps prior to this visit. She has had another episode of rapid palpitation and vomiting. There is a history of PVST.  Current Outpatient Prescriptions on File Prior to Visit  Medication Sig Dispense Refill  . amlodipine-benazepril (LOTREL) 2.5-10 MG per capsule Take 1 capsule by mouth daily.  30 capsule  12  . aspirin EC 81 MG tablet Take 81 mg by mouth every evening. Take 1 tablet daily to help prevent stroke, heart attack and blood clot.      Marland Kitchen atorvastatin (LIPITOR) 20 MG tablet TAKE  ONE TABLET BY MOUTH DAILY TO LOWER LIPIDS  30 tablet  5  . brimonidine (ALPHAGAN) 0.2 % ophthalmic solution Place 1 drop into both eyes 2 (two) times daily.      Marland Kitchen imipramine (TOFRANIL) 25 MG tablet TAKE 2 TABLETS BY MOUTH AT BEDTIME TO REDUCE URINARY FREQUENCY  60 tablet  2  . isosorbide mononitrate (IMDUR) 60 MG 24 hr tablet TAKE ONE TABLET BY MOUTH ONE   TIME DAILY  30 tablet  3  . latanoprost (XALATAN) 0.005 % ophthalmic solution Place 1 drop into both eyes every evening. UAD      . levothyroxine (SYNTHROID, LEVOTHROID) 50 MCG tablet Take 1 tablet (50 mcg total) by mouth daily.  90 tablet  3  . metoprolol tartrate (LOPRESSOR) 25 MG  tablet Take 1.5 tablets (37.5 mg total) by mouth 2 (two) times daily.  90 tablet  8  . Multiple Vitamin (MULTIVITAMIN WITH MINERALS) TABS Take 1 tablet by mouth daily. Certavite-A      . OVER THE COUNTER MEDICATION Take 1 tablet by mouth at bedtime. Sleep ease from [employer]      . pantoprazole (PROTONIX) 40 MG tablet Take one tablet once a day for acid reflux  30 tablet  5  . ZETIA 10 MG tablet TAKE 1 TABLET BY MOUTH DAILY  TO CONTROL LIPIDS  30 tablet  5   No current facility-administered medications on file prior to visit.    Review of Systems  Constitutional: Negative for fever, chills, weight loss, malaise/fatigue and diaphoresis.  HENT: Positive for hearing loss. Negative for congestion and sore throat.   Eyes: Negative for blurred vision and double vision.  Respiratory: Negative for cough, sputum production and shortness of breath.   Cardiovascular: Negative for chest pain, leg swelling and PND.  Gastrointestinal: Positive for nausea and constipation. Negative for heartburn and diarrhea.  Genitourinary: Positive for frequency. Negative for dysuria, urgency, hematuria and flank pain.  Musculoskeletal: Negative for back pain, myalgias and neck pain.  Skin: Negative for itching and rash.  Neurological: Negative for tingling, tremors, sensory change, speech change, focal  weakness, seizures, weakness and headaches.  Hematological: Bruises/bleeds easily.  Psychiatric/Behavioral: Positive for memory loss. Negative for depression. The patient does not have insomnia.        Objective:   Physical Exam  Constitutional: She appears well-developed and well-nourished.  HENT:  Head: Normocephalic.  Eyes: Conjunctivae are normal. Pupils are equal, round, and reactive to light.  Neck: Normal range of motion. No JVD present. No tracheal deviation present. No thyromegaly present.  Cardiovascular: Normal rate, regular rhythm, normal heart sounds and intact distal pulses.   Pulmonary/Chest: Effort  normal and breath sounds normal.  Abdominal: Bowel sounds are normal. She exhibits no mass. There is no tenderness. There is no guarding.  Musculoskeletal: Normal range of motion. She exhibits no edema.  Large ganglion of the proximal phalanx of the right fifth digit. Some restriction in full flexion.  Lymphadenopathy:    She has no cervical adenopathy.  Neurological: She is alert. She has normal reflexes. No cranial nerve deficit. Coordination normal.  Skin: No rash noted. No erythema.  Psychiatric: She has a normal mood and affect. Her behavior is normal. Judgment and thought content normal.          Assessment & Plan:  Nausea with vomiting: etiology uncertain. Not likely  To be infectious. Will get CBC and CMP in AM. Daughter was present at the end of the visit.  Unspecified hypothyroidism: recheck TSH in AM.  HYPERTENSION: controlled  PSVT: possibly associated with vomiting  Hyperglycemia: recheck in AM.

## 2013-11-01 NOTE — Patient Instructions (Signed)
Continue medications as listed 

## 2013-11-08 ENCOUNTER — Other Ambulatory Visit: Payer: Self-pay | Admitting: Cardiology

## 2013-11-09 ENCOUNTER — Encounter: Payer: Self-pay | Admitting: Geriatric Medicine

## 2013-11-09 ENCOUNTER — Non-Acute Institutional Stay: Payer: Medicare Other | Admitting: Geriatric Medicine

## 2013-11-09 DIAGNOSIS — T07XXXA Unspecified multiple injuries, initial encounter: Secondary | ICD-10-CM

## 2013-11-09 DIAGNOSIS — R269 Unspecified abnormalities of gait and mobility: Secondary | ICD-10-CM

## 2013-11-09 DIAGNOSIS — T148XXA Other injury of unspecified body region, initial encounter: Secondary | ICD-10-CM

## 2013-11-09 NOTE — Assessment & Plan Note (Signed)
Patient sustained multiple large skin tears today in a fall in her home. All wounds were cleaned with normal saline the skin edges approximated and is well as possible. Keep dressings in place until Thursday, will reassess at that time.

## 2013-11-09 NOTE — Progress Notes (Signed)
[name], female   DOB: 06/24/1920, 79 y.o.   MRN: [id number] [facility] [phone])  Code Status: Full Code Contact Information   Name Relation Home Work Mobile   [name] [phone]   [name] Daughter [phone]  [phone]   [name] Son  [phone]   [name] Daughter [phone]         Chief Complaint  Patient presents with  . Fall at home    HPI: This is a 79 y.o. female resident of [facility], Independent Living  section.  Evaluation is requested today due to another fall at home. The patient had a fall several days ago resulting in a superficial laceration over her right eye. Today she tripped over the threshold between the patio in the house, she was running to answer the phone. She denies any dizziness prior to the fall reports I just tripped.    No Known Allergies    Medication List       This list is accurate as of: 11/09/13  3:25 PM.  Always use your most recent med list.               amlodipine-benazepril 2.5-10 MG per capsule  Commonly known as:  LOTREL  Take 1 capsule by mouth daily.     aspirin EC 81 MG tablet  Take 81 mg by mouth every evening. Take 1 tablet daily to help prevent stroke, heart attack and blood clot.     atorvastatin 20 MG tablet  Commonly known as:  LIPITOR  TAKE  ONE TABLET BY MOUTH DAILY TO LOWER LIPIDS     brimonidine 0.2 % ophthalmic solution  Commonly known as:  ALPHAGAN  Place 1 drop into both eyes 2 (two) times daily.     imipramine 25 MG tablet  Commonly known as:  TOFRANIL  TAKE 2 TABLETS BY MOUTH AT BEDTIME TO REDUCE URINARY FREQUENCY     isosorbide mononitrate 60 MG 24 hr tablet  Commonly known as:  IMDUR  TAKE ONE TABLET BY MOUTH ONE   TIME DAILY     latanoprost 0.005 % ophthalmic solution  Commonly known as:  XALATAN  Place 1 drop into both eyes every evening. UAD      levothyroxine 50 MCG tablet  Commonly known as:  SYNTHROID, LEVOTHROID  Take 1 tablet (50 mcg total) by mouth daily.     metoprolol tartrate 25 MG tablet  Commonly known as:  LOPRESSOR  TAKE 1 1/2 TABLETS BY MOUTH TWICE DAILY     multivitamin with minerals Tabs tablet  Take 1 tablet by mouth daily. Certavite-A     OVER THE COUNTER MEDICATION  Take 1 tablet by mouth at bedtime. Sleep ease from [employer]     pantoprazole 40 MG tablet  Commonly known as:  PROTONIX  Take one tablet once a day for acid reflux     ZETIA 10 MG tablet  Generic drug:  ezetimibe  TAKE 1 TABLET BY MOUTH DAILY  TO CONTROL LIPIDS         DATA REVIEWED  Radiologic Exams  [facility] 08/30/2009 X-Ray of the  Right Shoulder Degenerative changes in the right shoulder with evidence underlying rotator cuff disease. No acute bony abnormality  01/03/10: Nondisplaced nasal bone fracture 02/06/2010 X-Ray of the Lumbar Spine Degenerative changes are present involving the lower lumbar spine and the right hip. No obvious fracture. Prior surgery involving the left hip 03/02/10: X-ray rt. shoulder-  Advanced and somewhat progressive glenohumeral joint degenerative changes. No acute bony abnormality 11/27/2010 Left ankle x-ray no acute osseous findings 04/30/2012 Lumbar spine x-ray DDD at L4-5 and L5-S1 05/20/2012 Pelvis x-ray healing fracture right greater trochanter of the femur. No displacement, no other acute findings  05/20/2012 Left hip x-ray good chronic appearance following distant total hip replacement on the left. 07/03/2012 Mammogram negative  01/16/2013 Chest x-ray hyperextended lungs without definite acute cardiopulmonary disease on this AP portable exam. Further evaluation with a PA and lateral chest radiograph be obtained as clinically indicated.   02/22/2013 CXR:  Multifocal patchy opacities in the right upper lobe and bilateral lower lobes, possibly ting pneumonia. Right hip x-ray:  No acute fracture or dislocation.  Deformity related to old fracture of the right greater trochanter. Left total hip arthroplasty. X-ray right fourth and fifth fingers:  No fracture or dislocation seen. Generative changes in the PIP and DIP joints  EKG: sinus rhythm, 68 beats per minute  Cardiovascular Exams  04/27/2009 EKG Left bundle branch block, left axis deviation 04/28/2009 Cardiac catheterization and unsuccessful attempt at PCTA of LAD.(Dr. Eldridge Dace)   Laboratory Studies  [facility] lab 08/11/2013 WBC 10.7, hemoglobin 13.6, hematocrit 40.8, platelets 247  Glucose 125, BUN 24, creatinine 1.15, sodium 140, potassium 4.5  TSH 5.75  09/21/2013 glucose 168, BUN 20, creatinine 1.02 sodium 140, potassium 4.0, alkaline phosphatase 145, total protein 8.5  Total cholesterol 157, triglycerides 189, HDL 40, LDL 79  TSH 11.96  11/02/2013 CBC 10.4, hemoglobin 15.3, hematocrit 45.2, platelets 258  Glucose 142, BUN 26, creatinine 1.15, sodium 137, potassium 3.9. Protein/LFTs WNL  TSH 5.85    Past Medical History  Diagnosis Date  . SVT (supraventricular tachycardia)     Diagnosed 2010  . HTN (hypertension)   . Dyslipidemia   . CAD (coronary artery disease)     a. NSTEMI 2010 in setting of SVT, with cath with unsuccessful PCI - chronic total occlusion of LAD, R->L collaterals.  . GI bleeding   . Unspecified hypothyroidism   . Unspecified hereditary and idiopathic peripheral neuropathy   . Pneumonia, organism unspecified   . Basal cell carcinoma of skin of trunk, except scrotum   . Unspecified malignant neoplasm of skin of lower limb, including hip   . Basal cell carcinoma of skin of lower limb, including hip   . Unspecified glaucoma(365.9)     left eye  . Other premature beats   . Other esophagitis   . Esophageal reflux   . Osteoarthrosis, unspecified whether generalized or localized, unspecified site     multiple joints  . Pain in joint, shoulder region   . Pain in joint, lower leg     knee  . Pain in joint, ankle  and foot     right  . Sacroiliitis, not elsewhere classified   . Cervicalgia   . Lumbago   . Ganglion of tendon sheath   . Pathologic fracture of vertebrae   . Tietze's disease   . Insomnia, unspecified   . Disturbance of skin sensation   . Edema   . Palpitations   . Other nonspecific abnormal serum enzyme levels   . Nasal bones, closed fracture   . Closed fracture of unspecified trochanteric section of femur   . Open wound of knee, leg (except thigh), and ankle, without mention of complication   . Hip, thigh, leg, and ankle, abrasion or friction burn, without mention of infection   . Contusion of hip   . Fall from other  slipping, tripping, or stumbling   . Personal history of fall   . Dyspepsia and other specified disorders of function of stomach   . Paroxysmal supraventricular tachycardia   . Acute bronchiolitis due to other infectious organisms 03/02/2013  . Other abnormal blood chemistry 03/15/2013  . Contusion of rib on left side 08/03/2013   Past Surgical History  Procedure Laterality Date  . Hip surgery  2001  . Breast lumpectomy  1995    right breast, negative for malignancy  . Cataract extraction  2003    bilateral  . Leg skin lesion  biopsy / excision Left 08/22/2010    Lower leg shave biopsy -solar keratosis (Dr. Mayford Knife)   Family Status  Relation Status Death Age  . Father Deceased 27    [place]  . Brother Deceased   . Mother Deceased 25    unknown  . Daughter Alive   . Son Alive   . Brother Deceased     accidental  . Son Alive   . Son Alive   . Daughter Alive    History   Social History Narrative   Patient is Married x 2(1st husband died age 48 , [place])  Occupation: House wife.  Lives in single level home, Independent Living  section at [facility] since 2008. Lives with spouse, has caregiver assistance in the AM.   No Smoking history , Minimal Alcohol history   Patient has no Advanced planning documents.               Review of  Systems  DATA OBTAINED: from patient, nurse,  GENERAL: Feels well   No fevers, fatigue, change in appetite or weight SKIN: No itch, rash  EYES: No eye pain, dryness or itching  No change in vision EARS: No earache, tinnitus, change in hearing NOSE: No congestion, drainage or bleeding MOUTH/THROAT: No mouth or tooth pain  No sore throat No difficulty chewing or swallowing RESPIRATORY: No cough, wheezing, SOB CARDIAC: No chest pain, palpitations  No edema. GI: No abdominal pain  No N/V/D or constipation  No heartburn or reflux  GU: No dysuria, frequency or urgency  No change in urine volume or character   MUSCULOSKELETAL: No joint pain, swelling or stiffness  No back pain  No muscle ache, pain, weakness  Gait is unsteady  Frequent falls. Does not use assistive devices NEUROLOGIC: No dizziness, fainting, headache,  No change in mental status.  PSYCHIATRIC: No feelings of anxiety, depression Sleeps well.  No behavior issue.    Physical Exam Filed Vitals:   11/09/13 1522  BP: 110/60  Pulse: 66  Resp: 18  SpO2: 96%   There is no weight on file to calculate BMI.  GENERAL APPEARANCE: No acute distress, appropriately groomed, normal body habitus. Alert, pleasant, conversant. SKIN: No diaphoresis, rash. Multiple skin tears: left upper arm very large, irregular, left elbow, left knee, very large, right knee, small.   All wounds cleansed with normal saline, skin edges approximated when possible. Large skin tear left upper arm covered with Vaseline gauze and wrapped for protection. [facility] dressings placed on elbow left knee and right knee  Resolving ecchymosis over right eye and forehead, healing superficial laceration over right eyebrow HEAD: Normocephalic, atraumatic EYES: Conjunctiva/lids clear. Pupils round, reactive.Marland Kitchen  EARS: External exam WNL,Poor Hearing (not new). NOSE: No deformity or discharge. MOUTH/THROAT: Lips w/o lesions. Oral mucosa, tongue moist, w/o lesion. Oropharynx w/o redness  or lesions.  NECK: Supple, full ROM. No thyroid tenderness, enlargement or nodule  LYMPHATICS: No head, neck or supraclavicular adenopathy RESPIRATORY: Breathing is even, unlabored. Lung sounds are clear and full.  CHEST/BREASTS: No chest deformity. Breasts without tenderness, mass, discharge CARDIOVASCULAR: Heart RRR. No murmur or extra heart sounds  EDEMA: No peripheral  edema.  GASTROINTESTINAL: Abdomen is soft, non-tender, not distended w/ normal bowel sounds. MUSCULOSKELETAL: Moves all extremities with full ROM, strength and tone. Back is without kyphosis, scoliosis or spinal process tenderness.  Unable to rise from the chair this afternoon, knees especially left knee very painful. NEUROLOGIC: Oriented to time, place, person. Cranial nerves 2-12 grossly intact, speech clear, no tremor.  PSYCHIATRIC: . Continues to display poor judgment regarding gait instability, safety at home  ASSESSMENT/PLAN  Gait disorder Another fall today in patient's home. She tripped over the threshold while  Walking into the house from the patio. She denies any dizziness, just says I tripped. Patient sustained large skin tears of her left arm, left knee. She is unable to stand from a chair today, ambulation is very unsafe at this point. Have recommended rehabilitation stay for at least 24-48 hours to help her regain some strength. Patient continues to exhibit very poor judgment regarding ambulation safety. She has had physical therapy interventions in the past, is not  willing to follow safety recommendations.   Wounds, multiple Patient sustained multiple large skin tears today in a fall in her home. All wounds were cleaned with normal saline the skin edges approximated and is well as possible. Keep dressings in place until Thursday, will reassess at that time.    Follow up: As needed  Raimundo Corbit T.Merryl Buckels, NP-C 11/09/2013

## 2013-11-09 NOTE — Assessment & Plan Note (Addendum)
Another fall today in patient's home. She tripped over the threshold while  Walking into the house from the patio. She denies any dizziness, just says I tripped. Patient sustained large skin tears of her left arm, left knee. She is unable to stand from a chair today, ambulation is very unsafe at this point. Have recommended rehabilitation stay for at least 24-48 hours to help her regain some strength. Patient continues to exhibit very poor judgment regarding ambulation safety. She has had physical therapy interventions in the past, is not  willing to follow safety recommendations.

## 2013-11-11 ENCOUNTER — Encounter: Payer: Self-pay | Admitting: Geriatric Medicine

## 2013-11-11 ENCOUNTER — Non-Acute Institutional Stay (SKILLED_NURSING_FACILITY): Payer: Medicare Other | Admitting: Geriatric Medicine

## 2013-11-11 DIAGNOSIS — T07XXXA Unspecified multiple injuries, initial encounter: Secondary | ICD-10-CM

## 2013-11-11 DIAGNOSIS — T148XXA Other injury of unspecified body region, initial encounter: Secondary | ICD-10-CM

## 2013-11-11 DIAGNOSIS — R269 Unspecified abnormalities of gait and mobility: Secondary | ICD-10-CM

## 2013-11-15 ENCOUNTER — Emergency Department (HOSPITAL_COMMUNITY)
Admission: EM | Admit: 2013-11-15 | Discharge: 2013-11-15 | Disposition: A | Discharge status: Home / Self Care | Payer: Medicare Other | Attending: Emergency Medicine | Admitting: Emergency Medicine

## 2013-11-15 ENCOUNTER — Emergency Department (HOSPITAL_COMMUNITY): Payer: Medicare Other

## 2013-11-15 DIAGNOSIS — IMO0002 Reserved for concepts with insufficient information to code with codable children: Secondary | ICD-10-CM | POA: Insufficient documentation

## 2013-11-15 DIAGNOSIS — Z9849 Cataract extraction status, unspecified eye: Secondary | ICD-10-CM | POA: Insufficient documentation

## 2013-11-15 DIAGNOSIS — Z7982 Long term (current) use of aspirin: Secondary | ICD-10-CM | POA: Insufficient documentation

## 2013-11-15 DIAGNOSIS — M1990 Unspecified osteoarthritis, unspecified site: Secondary | ICD-10-CM | POA: Insufficient documentation

## 2013-11-15 DIAGNOSIS — I2510 Atherosclerotic heart disease of native coronary artery without angina pectoris: Secondary | ICD-10-CM | POA: Insufficient documentation

## 2013-11-15 DIAGNOSIS — Z8739 Personal history of other diseases of the musculoskeletal system and connective tissue: Secondary | ICD-10-CM | POA: Insufficient documentation

## 2013-11-15 DIAGNOSIS — W010XXA Fall on same level from slipping, tripping and stumbling without subsequent striking against object, initial encounter: Secondary | ICD-10-CM | POA: Insufficient documentation

## 2013-11-15 DIAGNOSIS — M25569 Pain in unspecified knee: Secondary | ICD-10-CM | POA: Insufficient documentation

## 2013-11-15 DIAGNOSIS — S0990XA Unspecified injury of head, initial encounter: Secondary | ICD-10-CM | POA: Insufficient documentation

## 2013-11-15 DIAGNOSIS — Z8679 Personal history of other diseases of the circulatory system: Secondary | ICD-10-CM | POA: Insufficient documentation

## 2013-11-15 DIAGNOSIS — G4700 Insomnia, unspecified: Secondary | ICD-10-CM | POA: Insufficient documentation

## 2013-11-15 DIAGNOSIS — R269 Unspecified abnormalities of gait and mobility: Secondary | ICD-10-CM | POA: Insufficient documentation

## 2013-11-15 DIAGNOSIS — I10 Essential (primary) hypertension: Secondary | ICD-10-CM | POA: Insufficient documentation

## 2013-11-15 DIAGNOSIS — I252 Old myocardial infarction: Secondary | ICD-10-CM | POA: Insufficient documentation

## 2013-11-15 DIAGNOSIS — Z79899 Other long term (current) drug therapy: Secondary | ICD-10-CM | POA: Insufficient documentation

## 2013-11-15 DIAGNOSIS — M25529 Pain in unspecified elbow: Secondary | ICD-10-CM | POA: Insufficient documentation

## 2013-11-15 DIAGNOSIS — Z9181 History of falling: Secondary | ICD-10-CM | POA: Insufficient documentation

## 2013-11-15 DIAGNOSIS — Z85828 Personal history of other malignant neoplasm of skin: Secondary | ICD-10-CM | POA: Insufficient documentation

## 2013-11-15 DIAGNOSIS — Z8719 Personal history of other diseases of the digestive system: Secondary | ICD-10-CM | POA: Insufficient documentation

## 2013-11-15 DIAGNOSIS — Y9301 Activity, walking, marching and hiking: Secondary | ICD-10-CM | POA: Insufficient documentation

## 2013-11-15 DIAGNOSIS — Z8701 Personal history of pneumonia (recurrent): Secondary | ICD-10-CM | POA: Insufficient documentation

## 2013-11-15 DIAGNOSIS — R296 Repeated falls: Secondary | ICD-10-CM

## 2013-11-15 DIAGNOSIS — E039 Hypothyroidism, unspecified: Secondary | ICD-10-CM | POA: Insufficient documentation

## 2013-11-15 DIAGNOSIS — H409 Unspecified glaucoma: Secondary | ICD-10-CM | POA: Insufficient documentation

## 2013-11-15 DIAGNOSIS — K219 Gastro-esophageal reflux disease without esophagitis: Secondary | ICD-10-CM | POA: Insufficient documentation

## 2013-11-15 DIAGNOSIS — E785 Hyperlipidemia, unspecified: Secondary | ICD-10-CM | POA: Insufficient documentation

## 2013-11-15 DIAGNOSIS — G609 Hereditary and idiopathic neuropathy, unspecified: Secondary | ICD-10-CM | POA: Insufficient documentation

## 2013-11-15 DIAGNOSIS — Y9210 Unspecified residential institution as the place of occurrence of the external cause: Secondary | ICD-10-CM | POA: Insufficient documentation

## 2013-11-15 LAB — URINALYSIS, ROUTINE W REFLEX MICROSCOPIC
Bilirubin Urine: NEGATIVE
Glucose, UA: NEGATIVE mg/dL
Hgb urine dipstick: NEGATIVE
Ketones, ur: NEGATIVE mg/dL
Specific Gravity, Urine: 1.017 (ref 1.005–1.030)
pH: 6 (ref 5.0–8.0)

## 2013-11-15 LAB — URINE MICROSCOPIC-ADD ON

## 2013-11-15 LAB — GLUCOSE, CAPILLARY: Glucose-Capillary: 67 mg/dL — ABNORMAL LOW (ref 70–99)

## 2013-11-15 NOTE — ED Notes (Signed)
Cleaned patients head wound with cleaner.

## 2013-11-15 NOTE — ED Notes (Addendum)
Patient arrived via [facility] post fall. Patient was getting out of her car when she turned around and lost her balance. Patient struck her head on the cement. No LOC. VSS. Patient is A/O at this time. Patient stated to EMS that she is on a blood thinner but does not no the name of it. Nothing shows in her chart or on her med sheet.

## 2013-11-15 NOTE — Assessment & Plan Note (Signed)
Multiple skin tears look clean today, skin flaps appeared viable though it is early. Skin flaps on the left upper arm and left knee wounds are at risk for necrosis and sloughing.  We'll have clinic nurse change dressings every 3 days until healed.

## 2013-11-15 NOTE — Assessment & Plan Note (Signed)
Patient continues with unsteady gait, requires walker to assist with balance. Using a walker will also help her to slow down, avoiding tripping when she is moving too fast. Patient is resistant to using this assistive device at home. She has decided to stay another day to see if she is walking better tomorrow.

## 2013-11-15 NOTE — ED Notes (Signed)
[facility] to pick patient up.

## 2013-11-15 NOTE — ED Provider Notes (Signed)
CSN: [id number]     Arrival date & time 11/15/13  1405 History   First MD Initiated Contact with Patient 11/15/13 1458     Chief Complaint  Patient presents with  . Fall   (Consider location/radiation/quality/duration/timing/severity/associated sxs/prior Treatment) HPI Comments: 79 yo female with lipids, htn, MI presents after fall at independent living [facility].  Pt has had multiple falls the past week, 3rd one this am.  Pt has had strong recommendations to use walker as she catches her feet/ age related issues however stubborn per daughter and will not use.  This am she hit her head when turning to hand something to husband. No sxs prior or loc.   No recent infection. Pt feels well and no sxs now.   Patient is a 79 y.o. female presenting with fall. The history is provided by the patient and a relative.  Fall Pertinent negatives include no chest pain, no abdominal pain, no headaches and no shortness of breath.    Past Medical History  Diagnosis Date  . SVT (supraventricular tachycardia)     Diagnosed 2010  . HTN (hypertension)   . Dyslipidemia   . CAD (coronary artery disease)     a. NSTEMI 2010 in setting of SVT, with cath with unsuccessful PCI - chronic total occlusion of LAD, R->L collaterals.  . GI bleeding   . Unspecified hypothyroidism   . Unspecified hereditary and idiopathic peripheral neuropathy   . Pneumonia, organism unspecified   . Basal cell carcinoma of skin of trunk, except scrotum   . Unspecified malignant neoplasm of skin of lower limb, including hip   . Basal cell carcinoma of skin of lower limb, including hip   . Unspecified glaucoma(365.9)     left eye  . Other premature beats   . Other esophagitis   . Esophageal reflux   . Osteoarthrosis, unspecified whether generalized or localized, unspecified site     multiple joints  . Pain in joint, shoulder region   . Pain in joint, lower leg     knee  . Pain in joint, ankle and foot     right  . Sacroiliitis, not  elsewhere classified   . Cervicalgia   . Lumbago   . Ganglion of tendon sheath   . Pathologic fracture of vertebrae   . Tietze's disease   . Insomnia, unspecified   . Disturbance of skin sensation   . Edema   . Palpitations   . Other nonspecific abnormal serum enzyme levels   . Nasal bones, closed fracture   . Closed fracture of unspecified trochanteric section of femur   . Open wound of knee, leg (except thigh), and ankle, without mention of complication   . Hip, thigh, leg, and ankle, abrasion or friction burn, without mention of infection   . Contusion of hip   . Fall from other slipping, tripping, or stumbling   . Personal history of fall   . Dyspepsia and other specified disorders of function of stomach   . Paroxysmal supraventricular tachycardia   . Acute bronchiolitis due to other infectious organisms 03/02/2013  . Other abnormal blood chemistry 03/15/2013  . Contusion of rib on left side 08/03/2013   Past Surgical History  Procedure Laterality Date  . Hip surgery  2001  . Breast lumpectomy  1995    right breast, negative for malignancy  . Cataract extraction  2003    bilateral  . Leg skin lesion  biopsy / excision Left 08/22/2010    Lower leg  shave biopsy -solar keratosis (Dr. Mayford Knife)   Family History  Problem Relation Age of Onset  . Stroke Neg Hx   . Heart attack Neg Hx   . Heart disease Father   . Cancer Brother     lung   History  Substance Use Topics  . Smoking status: Never Smoker   . Smokeless tobacco: Never Used     Comment: denies smoking cigarettes  . Alcohol Use: Yes     Comment: drinks a scotch or wine every other day    OB History   Grav Para Term Preterm Abortions TAB SAB Ect Mult Living                 Review of Systems  Constitutional: Negative for fever and chills.  HENT: Negative for congestion.   Eyes: Negative for visual disturbance.  Respiratory: Negative for shortness of breath.   Cardiovascular: Negative for chest pain.   Gastrointestinal: Negative for vomiting and abdominal pain.  Genitourinary: Negative for dysuria and flank pain.  Musculoskeletal: Positive for arthralgias and gait problem. Negative for back pain, neck pain and neck stiffness.  Skin: Positive for wound. Negative for rash.  Neurological: Negative for light-headedness and headaches.    Allergies  Review of patient's allergies indicates no known allergies.  Home Medications   Current Outpatient Rx  Name  Route  Sig  Dispense  Refill  . amlodipine-benazepril (LOTREL) 2.5-10 MG per capsule   Oral   Take 1 capsule by mouth daily.   30 capsule   12   . aspirin EC 81 MG tablet   Oral   Take 81 mg by mouth every evening. Take 1 tablet daily to help prevent stroke, heart attack and blood clot.         Marland Kitchen atorvastatin (LIPITOR) 20 MG tablet   Oral   Take 20 mg by mouth daily.         . brimonidine (ALPHAGAN) 0.2 % ophthalmic solution   Both Eyes   Place 1 drop into both eyes 2 (two) times daily.         Marland Kitchen ezetimibe (ZETIA) 10 MG tablet   Oral   Take 10 mg by mouth daily.         Marland Kitchen imipramine (TOFRANIL) 25 MG tablet   Oral   Take 50 mg by mouth at bedtime.         . isosorbide mononitrate (IMDUR) 60 MG 24 hr tablet   Oral   Take 60 mg by mouth daily.         Marland Kitchen latanoprost (XALATAN) 0.005 % ophthalmic solution   Both Eyes   Place 1 drop into both eyes every evening. UAD         . levothyroxine (SYNTHROID, LEVOTHROID) 50 MCG tablet   Oral   Take 1 tablet (50 mcg total) by mouth daily.   90 tablet   3   . metoprolol tartrate (LOPRESSOR) 25 MG tablet   Oral   Take 37.5 mg by mouth 2 (two) times daily.         . Multiple Vitamin (MULTIVITAMIN WITH MINERALS) TABS   Oral   Take 1 tablet by mouth daily. Certavite-A         . OVER THE COUNTER MEDICATION   Oral   Take 1 tablet by mouth at bedtime. Sleep ease from [employer]         . pantoprazole (PROTONIX) 40 MG tablet      Take one  tablet once a  day for acid reflux   30 tablet   5    BP 159/79  Pulse 57  Temp(Src) 97.4 F (36.3 C) (Oral)  Resp 19  SpO2 100% Physical Exam  Nursing note and vitals reviewed. Constitutional: She is oriented to person, place, and time. She appears well-developed and well-nourished.  HENT:  Head: Normocephalic and atraumatic.  Eyes: Conjunctivae are normal. Right eye exhibits no discharge. Left eye exhibits no discharge.  Neck: Normal range of motion. Neck supple. No tracheal deviation present.  Cardiovascular: Normal rate and regular rhythm.   Pulmonary/Chest: Effort normal and breath sounds normal.  Abdominal: Soft. She exhibits no distension. There is no tenderness. There is no guarding.  Musculoskeletal: She exhibits tenderness (left lateral elbow (old fall) full rom elbow, anterior knee (old fall bandage in place) full rom). She exhibits no edema.  Full rom of hips bilateral and shoulders bilateral without discomfort  Neurological: She is alert and oriented to person, place, and time. No cranial nerve deficit. GCS eye subscore is 4. GCS verbal subscore is 5. GCS motor subscore is 6.  5+ strength in UE and LE with f/e at major joints. Sensation to palpation intact in UE and LE. CNs 2-12 grossly intact.  EOMFI.  PERRL.    coordination intact bilateral.   Visual fields intact to finger testing.   Skin: Skin is warm. No rash noted.  Psychiatric: She has a normal mood and affect.    ED Course  Procedures (including critical care time) Labs Review Labs Reviewed  GLUCOSE, CAPILLARY - Abnormal; Notable for the following:    Glucose-Capillary 67 (*)    All other components within normal limits  URINALYSIS, ROUTINE W REFLEX MICROSCOPIC   Imaging Review Ct Head Wo Contrast  11/15/2013   CLINICAL DATA:  Fall.  EXAM: CT HEAD WITHOUT CONTRAST  CT CERVICAL SPINE WITHOUT CONTRAST  TECHNIQUE: Multidetector CT imaging of the head and cervical spine was performed following the standard protocol  without intravenous contrast. Multiplanar CT image reconstructions of the cervical spine were also generated.  COMPARISON:  02/04/2010 plain film exam of the cervical spine. No comparison head CT.  FINDINGS: CT HEAD FINDINGS  Soft tissue swelling right parietal region without underlying fracture or intracranial hemorrhage.  Global atrophy without hydrocephalus.  Small vessel disease type changes without CT evidence of large acute infarct.  No intracranial mass lesion noted on this unenhanced exam.  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 No cervical spine fracture noted. No abnormal prevertebral soft tissue swelling or malalignment. If there is a high clinical suspicion of ligamentous injury, flexion and extension views or MR can be performed for further delineation.  Carotid bifurcation calcification.  Heterogeneous appearance of the thyroid gland with 1 cm lesion right lobe. This could be followed by thyroid ultrasound.  IMPRESSION: Soft tissue swelling right parietal region without underlying fracture or intracranial hemorrhage.  No cervical spine fracture.  Please see above.   Electronically Signed   By: Bridgett Larsson M.D.   On: 11/15/2013 17:26   Ct Cervical Spine Wo Contrast  11/15/2013   CLINICAL DATA:  Fall.  EXAM: CT HEAD WITHOUT CONTRAST  CT CERVICAL SPINE WITHOUT CONTRAST  TECHNIQUE: Multidetector CT imaging of the head and cervical spine was performed following the standard protocol without intravenous contrast. Multiplanar CT image reconstructions of the cervical spine were also generated.  COMPARISON:  02/04/2010 plain film exam of the cervical spine. No comparison head CT.  FINDINGS: CT  HEAD FINDINGS  Soft tissue swelling right parietal region without underlying fracture or intracranial hemorrhage.  Global atrophy without hydrocephalus.  Small vessel disease type changes without CT evidence of large acute infarct.  No intracranial mass lesion noted on this unenhanced exam.  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 No cervical spine fracture noted. No abnormal prevertebral soft tissue swelling or malalignment. If there is a high clinical suspicion of ligamentous injury, flexion and extension views or MR can be performed for further delineation.  Carotid bifurcation calcification.  Heterogeneous appearance of the thyroid gland with 1 cm lesion right lobe. This could be followed by thyroid ultrasound.  IMPRESSION: Soft tissue swelling right parietal region without underlying fracture or intracranial hemorrhage.  No cervical spine fracture.  Please see above.   Electronically Signed   By: Bridgett Larsson M.D.   On: 11/15/2013 17:26    EKG Interpretation   None       MDM   1. Frequent falls   2. Head injury, initial encounter    Recommended screening labs/ UA as pt has fallen more frequently Stressed importance of using walker to decr risk of brain bleed/ hip fx, et Karie Soda.  Pt refusing blood, family understands she is stubborn, she did agree to CTxs. She refuses plane xrays of previous injuries.  CT no acute findings. Results and differential diagnosis were discussed with the patient. Close follow up outpatient was discussed, patient comfortable with the plan.   Diagnosis:  Falls, Head injury    Enid Skeens, MD 11/15/13 1743

## 2013-11-15 NOTE — Progress Notes (Signed)
[name], female   DOB: August 20, 1920, 79 y.o.   MRN: [id number] [facility] [phone])  Code Status: Full Code     Contact Information   Name Relation Home Work Mobile   Erskine & Christen Butter [phone]   [name],[name] Daughter [phone]  [phone]   Jayson,[name] Son  [phone]   [name] Daughter [phone]         Chief Complaint  Patient presents with  . F/U fall, wounds, gait disorder    Rehab discharge  . Discharge Note    HPI: This is a 79 y.o. female resident of [facility], Independent Living  section. She was admitted to rehabilitation section at [facility] 2 days ago after another fall at home.  The patient had a fall several days prior to admission resulting in a superficial laceration over her right eye.   Last visit: Gait disorder Another fall today in patient's home. She tripped over the threshold while  Walking into the house from the patio. She denies any dizziness, just says I tripped. Patient sustained large skin tears of her left arm, left knee. She is unable to stand from a chair today, ambulation is very unsafe at this point. Have recommended rehabilitation stay for at least 24-48 hours to help her regain some strength. Patient continues to exhibit very poor judgment regarding ambulation safety. She has had physical therapy interventions in the past, is not  willing to follow safety recommendations.   Wounds, multiple Patient sustained multiple large skin tears today in a fall in her home. All wounds were cleaned with normal saline the skin edges approximated and is well as possible. Keep dressings in place until Thursday, will reassess at that time.  Today patient reports feeling very well is anxious to be discharged home. She has not been ambulating very well due to knee discomfort. Have again discussed recommendation for additional assistance at home,  possibly 24-hour care at this point due to her gait instability as well as her spouse's gait instability. Both patient and spouse decline home care assistance    No Known Allergies    Medication List       This list is accurate as of: 11/11/13 11:59 PM.  Always use your most recent med list.               amlodipine-benazepril 2.5-10 MG per capsule  Commonly known as:  LOTREL  Take 1 capsule by mouth daily.     aspirin EC 81 MG tablet  Take 81 mg by mouth every evening. Take 1 tablet daily to help prevent stroke, heart attack and blood clot.     atorvastatin 20 MG tablet  Commonly known as:  LIPITOR  TAKE  ONE TABLET BY MOUTH DAILY TO LOWER LIPIDS     brimonidine 0.2 % ophthalmic solution  Commonly known as:  ALPHAGAN  Place 1 drop into both eyes 2 (two) times daily.     imipramine 25 MG tablet  Commonly known as:  TOFRANIL  TAKE 2 TABLETS BY MOUTH AT BEDTIME TO REDUCE URINARY FREQUENCY     isosorbide mononitrate 60 MG 24 hr tablet  Commonly known as:  IMDUR  TAKE ONE TABLET BY MOUTH ONE   TIME DAILY     latanoprost 0.005 % ophthalmic solution  Commonly known as:  XALATAN  Place 1 drop into both eyes every evening. UAD     levothyroxine 50 MCG tablet  Commonly known as:  SYNTHROID,  LEVOTHROID  Take 1 tablet (50 mcg total) by mouth daily.     metoprolol tartrate 25 MG tablet  Commonly known as:  LOPRESSOR  TAKE 1 1/2 TABLETS BY MOUTH TWICE DAILY     multivitamin with minerals Tabs tablet  Take 1 tablet by mouth daily. Certavite-A     OVER THE COUNTER MEDICATION  Take 1 tablet by mouth at bedtime. Sleep ease from [employer]     pantoprazole 40 MG tablet  Commonly known as:  PROTONIX  Take one tablet once a day for acid reflux     ZETIA 10 MG tablet  Generic drug:  ezetimibe  TAKE 1 TABLET BY MOUTH DAILY  TO CONTROL LIPIDS         DATA REVIEWED  Radiologic Exams  [facility] List 04/30/2012 Lumbar spine x-ray DDD at L4-5 and L5-S1 05/20/2012 Pelvis x-ray  healing fracture right greater trochanter of the femur. No displacement, no other acute findings  05/20/2012 Left hip x-ray good chronic appearance following distant total hip replacement on the left. 07/03/2012 Mammogram negative  01/16/2013 Chest x-ray hyperextended lungs without definite acute cardiopulmonary disease on this AP portable exam. Further evaluation with a PA and lateral chest radiograph be obtained as clinically indicated.   02/22/2013 CXR:  Multifocal patchy opacities in the right upper lobe and bilateral lower lobes, possibly ting pneumonia. Right hip x-ray:  No acute fracture or dislocation. Deformity related to old fracture of the right greater trochanter. Left total hip arthroplasty. X-ray right fourth and fifth fingers:  No fracture or dislocation seen. Generative changes in the PIP and DIP joints   Cardiovascular Exams  04/27/2009 EKG Left bundle branch block, left axis deviation 04/28/2009 Cardiac catheterization and unsuccessful attempt at PCTA of LAD.(Dr. Eldridge Dace)  02/22/2013 EKG: sinus rhythm, 68 beats per minute  Laboratory Studies  [facility] lab 08/11/2013 WBC 10.7, hemoglobin 13.6, hematocrit 40.8, platelets 247  Glucose 125, BUN 24, creatinine 1.15, sodium 140, potassium 4.5  TSH 5.75  09/21/2013 glucose 168, BUN 20, creatinine 1.02 sodium 140, potassium 4.0, alkaline phosphatase 145, total protein 8.5  Total cholesterol 157, triglycerides 189, HDL 40, LDL 79  TSH 11.96  11/02/2013 CBC 10.4, hemoglobin 15.3, hematocrit 45.2, platelets 258  Glucose 142, BUN 26, creatinine 1.15, sodium 137, potassium 3.9. Protein/LFTs WNL  TSH 5.85   Review of Systems  DATA OBTAINED: from patient, nurse,  GENERAL: Feels well   No fevers, fatigue, change in appetite or weight SKIN: No itch, rash    Multiples wounds EYES: No eye pain, dryness or itching  No change in vision EARS: No earache, tinnitus, change in hearing NOSE: No congestion, drainage or bleeding MOUTH/THROAT: No  mouth or tooth pain  No sore throat No difficulty chewing or swallowing RESPIRATORY: No cough, wheezing, SOB CARDIAC: No chest pain, palpitations  No edema. GI: No abdominal pain  No N/V/D or constipation  No heartburn or reflux  GU: No dysuria, frequency or urgency  No change in urine volume or character   MUSCULOSKELETAL: No joint pain, swelling or stiffness  No back pain  No muscle ache, pain, weakness  Gait is unsteady  Frequent falls. Does not use assistive devices NEUROLOGIC: No dizziness, fainting, headache,  No change in mental status.  PSYCHIATRIC: No feelings of anxiety, depression Sleeps well.  No behavior issue.    Physical Exam Filed Vitals:   11/11/13 1207  BP: 184/86  Pulse: 64  Temp: 97 F (36.1 C)  Resp: 15  Weight: 146 lb 9.6 oz (  66.497 kg)  SpO2: 99%   Body mass index is 23.67 kg/(m^2).  GENERAL APPEARANCE: No acute distress, appropriately groomed, normal body habitus. Alert, pleasant, conversant. SKIN: No diaphoresis, rash. Multiple skin tears: left upper arm very large, irregular, left elbow, left knee, very large, right knee, small.   All wounds remain clean, skin flaps appear viable. Cleansed with normal saline,  Large skin tear left upper arm covered with Vaseline gauze and wrapped for protection. Mepilex dressings placed on left elbow, left knee and right knee  Resolving ecchymosis over right eye and forehead, healing superficial laceration over right eyebrow HEAD: Normocephalic, atraumatic EYES: Conjunctiva/lids clear. Pupils round, reactive.Marland Kitchen  EARS: External exam WNL,Poor Hearing (not new). NOSE: No deformity or discharge. MOUTH/THROAT: Lips w/o lesions. Oral mucosa, tongue moist, w/o lesion. Oropharynx w/o redness or lesions.  NECK: Supple, full ROM. No thyroid tenderness, enlargement or nodule LYMPHATICS: No head, neck or supraclavicular adenopathy RESPIRATORY: Breathing is even, unlabored. Lung sounds are clear and full.  CHEST/BREASTS: No chest  deformity. Breasts without tenderness, mass, discharge CARDIOVASCULAR: Heart RRR. No murmur or extra heart sounds. Repeat BP 142/78, pulse 57  EDEMA: No peripheral  edema.  GASTROINTESTINAL: Abdomen is soft, non-tender, not distended w/ normal bowel sounds. MUSCULOSKELETAL: Moves all extremities with full ROM, strength and tone. Back is without kyphosis, scoliosis or spinal process tenderness.  Able to rise from lift chair this afternoon, very unsteady. Requires wlaker for balance.  NEUROLOGIC: Oriented to time, place, person. Speech clear, no tremor.  PSYCHIATRIC: . Continues to display poor judgment regarding gait instability, safety at home  ASSESSMENT/PLAN  Gait disorder Patient continues with unsteady gait, requires walker to assist with balance. Using a walker will also help her to slow down, avoiding tripping when she is moving too fast. Patient is resistant to using this assistive device at home. She has decided to stay another day to see if she is walking better tomorrow.   Wounds, multiple Multiple skin tears look clean today, skin flaps appeared viable though it is early. Skin flaps on the left upper arm and left knee wounds are at risk for necrosis and sloughing.  We'll have clinic nurse change dressings every 3 days until healed.    Follow up: As scheduled in Clinic  Fontella Shan T.Lavonn Maxcy, NP-C 11/15/2013

## 2013-12-06 ENCOUNTER — Other Ambulatory Visit: Payer: Self-pay | Admitting: Internal Medicine

## 2013-12-21 LAB — TSH: TSH: 6.1 u[IU]/mL — AB (ref 0.41–5.90)

## 2013-12-21 LAB — BASIC METABOLIC PANEL
Creatinine: 1.1 mg/dL (ref 0.5–1.1)
Glucose: 92 mg/dL
Potassium: 4.5 mmol/L (ref 3.4–5.3)

## 2013-12-21 LAB — HEMOGLOBIN A1C: Hgb A1c MFr Bld: 5.7 % (ref 4.0–6.0)

## 2013-12-24 ENCOUNTER — Emergency Department (HOSPITAL_COMMUNITY): Payer: Medicare Other

## 2013-12-24 ENCOUNTER — Emergency Department (HOSPITAL_COMMUNITY)
Admission: EM | Admit: 2013-12-24 | Discharge: 2013-12-25 | Disposition: A | Discharge status: Home / Self Care | Payer: Medicare Other | Attending: Emergency Medicine | Admitting: Emergency Medicine

## 2013-12-24 ENCOUNTER — Encounter (HOSPITAL_COMMUNITY): Payer: Self-pay | Admitting: Emergency Medicine

## 2013-12-24 DIAGNOSIS — I2510 Atherosclerotic heart disease of native coronary artery without angina pectoris: Secondary | ICD-10-CM | POA: Insufficient documentation

## 2013-12-24 DIAGNOSIS — E785 Hyperlipidemia, unspecified: Secondary | ICD-10-CM | POA: Insufficient documentation

## 2013-12-24 DIAGNOSIS — W010XXA Fall on same level from slipping, tripping and stumbling without subsequent striking against object, initial encounter: Secondary | ICD-10-CM | POA: Insufficient documentation

## 2013-12-24 DIAGNOSIS — Z9181 History of falling: Secondary | ICD-10-CM | POA: Insufficient documentation

## 2013-12-24 DIAGNOSIS — Z87311 Personal history of (healed) other pathological fracture: Secondary | ICD-10-CM | POA: Insufficient documentation

## 2013-12-24 DIAGNOSIS — Z87828 Personal history of other (healed) physical injury and trauma: Secondary | ICD-10-CM | POA: Insufficient documentation

## 2013-12-24 DIAGNOSIS — Z8781 Personal history of (healed) traumatic fracture: Secondary | ICD-10-CM | POA: Insufficient documentation

## 2013-12-24 DIAGNOSIS — S0003XA Contusion of scalp, initial encounter: Secondary | ICD-10-CM | POA: Insufficient documentation

## 2013-12-24 DIAGNOSIS — Z85828 Personal history of other malignant neoplasm of skin: Secondary | ICD-10-CM | POA: Insufficient documentation

## 2013-12-24 DIAGNOSIS — Y9210 Unspecified residential institution as the place of occurrence of the external cause: Secondary | ICD-10-CM | POA: Insufficient documentation

## 2013-12-24 DIAGNOSIS — Z8701 Personal history of pneumonia (recurrent): Secondary | ICD-10-CM | POA: Insufficient documentation

## 2013-12-24 DIAGNOSIS — K219 Gastro-esophageal reflux disease without esophagitis: Secondary | ICD-10-CM | POA: Insufficient documentation

## 2013-12-24 DIAGNOSIS — Y9389 Activity, other specified: Secondary | ICD-10-CM | POA: Insufficient documentation

## 2013-12-24 DIAGNOSIS — I498 Other specified cardiac arrhythmias: Secondary | ICD-10-CM | POA: Insufficient documentation

## 2013-12-24 DIAGNOSIS — W102XXA Fall (on)(from) incline, initial encounter: Secondary | ICD-10-CM

## 2013-12-24 DIAGNOSIS — M940 Chondrocostal junction syndrome [Tietze]: Secondary | ICD-10-CM | POA: Insufficient documentation

## 2013-12-24 DIAGNOSIS — Z79899 Other long term (current) drug therapy: Secondary | ICD-10-CM | POA: Insufficient documentation

## 2013-12-24 DIAGNOSIS — E039 Hypothyroidism, unspecified: Secondary | ICD-10-CM | POA: Insufficient documentation

## 2013-12-24 DIAGNOSIS — Z7982 Long term (current) use of aspirin: Secondary | ICD-10-CM | POA: Insufficient documentation

## 2013-12-24 DIAGNOSIS — Z8669 Personal history of other diseases of the nervous system and sense organs: Secondary | ICD-10-CM | POA: Insufficient documentation

## 2013-12-24 DIAGNOSIS — S62639A Displaced fracture of distal phalanx of unspecified finger, initial encounter for closed fracture: Secondary | ICD-10-CM | POA: Insufficient documentation

## 2013-12-24 DIAGNOSIS — G4700 Insomnia, unspecified: Secondary | ICD-10-CM | POA: Insufficient documentation

## 2013-12-24 DIAGNOSIS — M1990 Unspecified osteoarthritis, unspecified site: Secondary | ICD-10-CM | POA: Insufficient documentation

## 2013-12-24 MED ORDER — ACETAMINOPHEN 325 MG PO TABS
650.0000 mg | ORAL_TABLET | Freq: Once | ORAL | Status: AC
  Administered 2013-12-24: 650 mg via ORAL
  Filled 2013-12-24: qty 2

## 2013-12-24 NOTE — ED Provider Notes (Signed)
CSN: [id number]     Arrival date & time 12/24/13  1549 History   First MD Initiated Contact with Patient 12/24/13 2208     Chief Complaint  Patient presents with  . Fall   (Consider location/radiation/quality/duration/timing/severity/associated sxs/prior Treatment) HPI Comments: Patient is a 79 yo F PMHx significant for SVT, HTN, Dyslipidemia, CAD presenting to the ED after a fall this morning. The patient states she tripped over the door way, fell and landed on her right side. The patient denies losing consciousness, but states she broke her glasses and obtained some abrasions and bruises to the right side of her body. Patient was seen by the [facility] nurse who was able to bandage up all abrasions and wounds. The Independent living facility was concerned the patient may have broken her hand and/or ribs. The patient is complaining of mild discomfort to the right side of her chest and her right hand. She states she took an Aleve with some improvement of her symptoms earlier today.    Past Medical History  Diagnosis Date  . SVT (supraventricular tachycardia)     Diagnosed 2010  . HTN (hypertension)   . Dyslipidemia   . CAD (coronary artery disease)     a. NSTEMI 2010 in setting of SVT, with cath with unsuccessful PCI - chronic total occlusion of LAD, R->L collaterals.  . GI bleeding   . Unspecified hypothyroidism   . Unspecified hereditary and idiopathic peripheral neuropathy   . Pneumonia, organism unspecified   . Basal cell carcinoma of skin of trunk, except scrotum   . Unspecified malignant neoplasm of skin of lower limb, including hip   . Basal cell carcinoma of skin of lower limb, including hip   . Unspecified glaucoma(365.9)     left eye  . Other premature beats   . Other esophagitis   . Esophageal reflux   . Osteoarthrosis, unspecified whether generalized or localized, unspecified site     multiple joints  . Pain in joint, shoulder region   . Pain in  joint, lower leg     knee  . Pain in joint, ankle and foot     right  . Sacroiliitis, not elsewhere classified   . Cervicalgia   . Lumbago   . Ganglion of tendon sheath   . Pathologic fracture of vertebrae   . Tietze's disease   . Insomnia, unspecified   . Disturbance of skin sensation   . Edema   . Palpitations   . Other nonspecific abnormal serum enzyme levels   . Nasal bones, closed fracture   . Closed fracture of unspecified trochanteric section of femur   . Open wound of knee, leg (except thigh), and ankle, without mention of complication   . Hip, thigh, leg, and ankle, abrasion or friction burn, without mention of infection   . Contusion of hip   . Fall from other slipping, tripping, or stumbling   . Personal history of fall   . Dyspepsia and other specified disorders of function of stomach   . Paroxysmal supraventricular tachycardia   . Acute bronchiolitis due to other infectious organisms 03/02/2013  . Other abnormal blood chemistry 03/15/2013  . Contusion of rib on left side 08/03/2013   Past Surgical History  Procedure Laterality Date  . Hip surgery  2001  . Breast lumpectomy  1995    right breast, negative for malignancy  . Cataract extraction  2003    bilateral  . Leg skin lesion  biopsy / excision Left  08/22/2010    Lower leg shave biopsy -solar keratosis (Dr. Mayford Knife)   Family History  Problem Relation Age of Onset  . Stroke Neg Hx   . Heart attack Neg Hx   . Heart disease Father   . Cancer Brother     lung   History  Substance Use Topics  . Smoking status: Never Smoker   . Smokeless tobacco: Never Used     Comment: denies smoking cigarettes  . Alcohol Use: Yes     Comment: drinks a scotch or wine every other day    OB History   Grav Para Term Preterm Abortions TAB SAB Ect Mult Living                 Review of Systems  Constitutional: Negative for fever and chills.  Respiratory: Negative for chest tightness and shortness of breath.     Cardiovascular: Positive for chest pain. Negative for palpitations and leg swelling.  Gastrointestinal: Negative for nausea and vomiting.  Skin: Positive for wound.  Neurological: Positive for headaches. Negative for syncope.  All other systems reviewed and are negative.    Allergies  Review of patient's allergies indicates no known allergies.  Home Medications   Current Outpatient Rx  Name  Route  Sig  Dispense  Refill  . amlodipine-benazepril (LOTREL) 2.5-10 MG per capsule   Oral   Take 1 capsule by mouth daily.   30 capsule   12   . aspirin EC 81 MG tablet   Oral   Take 81 mg by mouth every evening. Take 1 tablet daily to help prevent stroke, heart attack and blood clot.         Marland Kitchen atorvastatin (LIPITOR) 20 MG tablet   Oral   Take 20 mg by mouth daily.         . brimonidine (ALPHAGAN) 0.2 % ophthalmic solution   Both Eyes   Place 1 drop into both eyes 2 (two) times daily.         Marland Kitchen ezetimibe (ZETIA) 10 MG tablet   Oral   Take 10 mg by mouth daily.         Marland Kitchen imipramine (TOFRANIL) 25 MG tablet   Oral   Take 50 mg by mouth at bedtime.         . isosorbide mononitrate (IMDUR) 60 MG 24 hr tablet   Oral   Take 60 mg by mouth daily.         Marland Kitchen latanoprost (XALATAN) 0.005 % ophthalmic solution   Both Eyes   Place 1 drop into both eyes every evening. UAD         . levothyroxine (SYNTHROID, LEVOTHROID) 50 MCG tablet   Oral   Take 1 tablet (50 mcg total) by mouth daily.   90 tablet   3   . metoprolol tartrate (LOPRESSOR) 25 MG tablet   Oral   Take 37.5 mg by mouth 2 (two) times daily.         . Multiple Vitamin (MULTIVITAMIN WITH MINERALS) TABS   Oral   Take 1 tablet by mouth daily. Certavite-A         . OVER THE COUNTER MEDICATION   Oral   Take 1 tablet by mouth at bedtime. Sleep ease from [employer]         . pantoprazole (PROTONIX) 40 MG tablet      Take one tablet once a day for acid reflux   30 tablet   5  BP 150/70  Pulse  75  Temp(Src) 98.5 F (36.9 C) (Oral)  Resp 20  SpO2 98% Physical Exam  Constitutional: She is oriented to person, place, and time. She appears well-developed and well-nourished. No distress.  HENT:  Head: Normocephalic. Head is with contusion. Head is without raccoon's eyes, without Battle's sign and without laceration.    Right Ear: External ear normal.  Left Ear: External ear normal.  Nose: Nose normal.  Mouth/Throat: Oropharynx is clear and moist. No oropharyngeal exudate.  Eyes: Conjunctivae and EOM are normal. Pupils are equal, round, and reactive to light.  Neck: Normal range of motion. Neck supple.  Cardiovascular: Normal rate, regular rhythm, normal heart sounds and intact distal pulses.   Pulmonary/Chest: Effort normal and breath sounds normal. No respiratory distress. She has no decreased breath sounds. She exhibits tenderness. She exhibits no deformity and no swelling.    Abdominal: Soft. There is no tenderness.  Neurological: She is alert and oriented to person, place, and time. She has normal strength. No cranial nerve deficit or sensory deficit. Gait normal. GCS eye subscore is 4. GCS verbal subscore is 5. GCS motor subscore is 6.  No pronator drift. Bilateral heel-knee-shin intact.  Skin: Skin is warm and dry. She is not diaphoretic.       ED Course  Procedures (including critical care time) Labs Review Labs Reviewed - No data to display Imaging Review Dg Ribs Unilateral W/chest Right  12/24/2013   CLINICAL DATA:  Fall, right anterior rib pain, shortness of breath  EXAM: RIGHT RIBS AND CHEST - 3+ VIEW  COMPARISON:  04/09/2013 chest radiograph  FINDINGS: Mild enlargement of the cardiomediastinal silhouette is stable. Prominence of the bilateral 1st costochondral junctions is unchanged. Hyperinflation may suggest COPD. No focal pulmonary opacity. No pleural effusion. No pneumothorax. No displaced right-sided rib fracture is identified. There is mild irregularity at  several partly ossified right anterior costochondral junctions but no displaced rib fracture is seen.  IMPRESSION: No acute cardiopulmonary process. No displaced rib fracture identified.   Electronically Signed   By: Christiana Pellant M.D.   On: 12/24/2013 18:52   Dg Finger Ring Right  12/24/2013   CLINICAL DATA:  Fall, laceration to the right ring finger  EXAM: RIGHT RING FINGER 2+V  COMPARISON:  02/22/2013  FINDINGS: Degenerative change reidentified at the right 4th digit DIP and PIP joints, respectively. Detail obscured by overlying bandage material. Nondisplaced dorsal plate avulsion fracture identified at the base of the distal phalanx. No radiopaque foreign body. No dislocation.  IMPRESSION: Right 4th digit distal phalanx dorsal plate avulsion fracture.   Electronically Signed   By: Christiana Pellant M.D.   On: 12/24/2013 18:55    EKG Interpretation   None       MDM   1. Fall (on)(from) incline, initial encounter   2. Scalp hematoma, initial encounter    Afebrile, NAD, non-toxic appearing, AAOx4. Patient with mechanical fall. No neuro focal deficits on examination. Scalp hematoma above right eyebrow appreciated. All imaging results reviewed. Right fourth digit immobilized, patient with followup scheduled with PCP who will make any further needed appointments for patient in for recheck. Chest x-ray clear for any acute abnormalities, no rib fractures identified. CT scans negative for any intracranial process. Patient will call back to assisted living where [place] to check in on her. Extensive return precautions discussed. Patient and daughter are agreeable to plan. Patient stable at time of discharge. Patient d/w with Dr. Micheline Maze, agrees with plan.  Lisa Ellis, PA-C 12/25/13 [phone]

## 2013-12-24 NOTE — ED Notes (Addendum)
Pt is from [facility]. Pt reports having a fall this morning while returning from checking the mail. Pt broke her glasses during the fall. Pt has a hematoma that encircles the right outer eye. Pt has a laceration to the right wrist, which was treated with steri-strips from the [place] [facility]. Daughter reports the patient was sent here to x-ray her finger for a possible finger joint dislocation of the right ring finger as well as for x-rays to rule out rib fracture, pt reports pain to right rib area following the fall. Pt denies being on anticoagulants.

## 2013-12-25 NOTE — ED Provider Notes (Signed)
Medical screening examination/treatment/procedure(s) were conducted as a shared visit with non-physician practitioner(s) and myself.  I personally evaluated the patient during the encounter.  Pt presents w/ head injury, and injury to R extremity, R chest after mechanical fall.  No LOC. On my exam, Pt in NAD, GCS 15 w/o focal neuro findings. Pt found ot have fx of R 4th distal phalanx.  CT head, CXR negative.  Return precautions given for new or worsening symptoms including focal neuro sx, h/a, vomiting.   EKG Interpretation   None         Shanna Cisco, MD 12/25/13 1116

## 2013-12-27 ENCOUNTER — Non-Acute Institutional Stay: Payer: Medicare Other | Admitting: Internal Medicine

## 2013-12-27 VITALS — BP 124/62 | HR 64 | Wt 148.0 lb

## 2013-12-27 DIAGNOSIS — R739 Hyperglycemia, unspecified: Secondary | ICD-10-CM

## 2013-12-27 DIAGNOSIS — R7309 Other abnormal glucose: Secondary | ICD-10-CM

## 2013-12-27 DIAGNOSIS — E039 Hypothyroidism, unspecified: Secondary | ICD-10-CM

## 2013-12-27 DIAGNOSIS — S62609D Fracture of unspecified phalanx of unspecified finger, subsequent encounter for fracture with routine healing: Secondary | ICD-10-CM

## 2013-12-27 DIAGNOSIS — IMO0001 Reserved for inherently not codable concepts without codable children: Secondary | ICD-10-CM

## 2013-12-27 DIAGNOSIS — Z5189 Encounter for other specified aftercare: Secondary | ICD-10-CM

## 2013-12-27 DIAGNOSIS — R269 Unspecified abnormalities of gait and mobility: Secondary | ICD-10-CM

## 2013-12-27 DIAGNOSIS — S01119D Laceration without foreign body of unspecified eyelid and periocular area, subsequent encounter: Secondary | ICD-10-CM

## 2013-12-27 DIAGNOSIS — I10 Essential (primary) hypertension: Secondary | ICD-10-CM

## 2013-12-27 NOTE — Progress Notes (Signed)
[name], female   DOB: Sep 05, 1920, 79 y.o.   MRN: [id number]    Location:  [facility] [employer] [facility]: Clinic (12)    No Known Allergies  Chief Complaint  Patient presents with  . Medical Managment of Chronic Issues    blood pressure, thyroid, hyperglycemia, gait disorder. Patient has fallen 11/07/13; 11/15/13; 12/24/13.    HPI:  Larey Seat 12/24/13 Abrasion of the right eyebrow area. Laceration of the right thumb. Right 4th digit distal phalanx dorsal plate avulsion fracture.  HYPERTENSION: controlled  Hyperglycemia: last glucose normal  Gait disorder: multiple falls  Unspecified hypothyroidism: Recent TSH IS 6.096.   Medications: Patient's Medications  New Prescriptions   No medications on file  Previous Medications   AMLODIPINE-BENAZEPRIL (LOTREL) 2.5-10 MG PER CAPSULE    Take 1 capsule by mouth daily.   ASPIRIN EC 81 MG TABLET    Take 81 mg by mouth every evening. Take 1 tablet daily to help prevent stroke, heart attack and blood clot.   ATORVASTATIN (LIPITOR) 20 MG TABLET    Take 20 mg by mouth daily.   BRIMONIDINE (ALPHAGAN) 0.2 % OPHTHALMIC SOLUTION    Place 1 drop into both eyes 2 (two) times daily.   EZETIMIBE (ZETIA) 10 MG TABLET    Take 10 mg by mouth daily.   IMIPRAMINE (TOFRANIL) 25 MG TABLET    Take 50 mg by mouth at bedtime.   ISOSORBIDE MONONITRATE (IMDUR) 60 MG 24 HR TABLET    Take 60 mg by mouth daily.   LATANOPROST (XALATAN) 0.005 % OPHTHALMIC SOLUTION    Place 1 drop into both eyes every evening. UAD   LEVOTHYROXINE (SYNTHROID, LEVOTHROID) 50 MCG TABLET    Take 1 tablet (50 mcg total) by mouth daily.   METOPROLOL TARTRATE (LOPRESSOR) 25 MG TABLET    Take 37.5 mg by mouth 2 (two) times daily.   MULTIPLE VITAMIN (MULTIVITAMIN WITH MINERALS) TABS    Take 1 tablet by mouth daily. Certavite-A   OVER THE COUNTER MEDICATION    Take 1 tablet by mouth at bedtime. Sleep ease from [employer]   PANTOPRAZOLE (PROTONIX) 40 MG  TABLET    Take one tablet once a day for acid reflux  Modified Medications   No medications on file  Discontinued Medications   No medications on file     Review of Systems  Constitutional: Negative for fever, chills, weight loss, malaise/fatigue and diaphoresis.  HENT: Positive for hearing loss. Negative for congestion and sore throat.   Eyes: Negative for blurred vision and double vision.  Respiratory: Negative for cough, sputum production and shortness of breath.   Cardiovascular: Negative for chest pain, leg swelling and PND.  Gastrointestinal: Positive for nausea and constipation. Negative for heartburn and diarrhea.  Genitourinary: Positive for frequency. Negative for dysuria, urgency, hematuria and flank pain.  Musculoskeletal: Negative for back pain, myalgias and neck pain.  Skin: Negative for itching and rash.       Laceration of right eyebrow.  Neurological: Negative for tingling, tremors, sensory change, speech change, focal weakness, seizures, weakness and headaches.  Hematological: Bruises/bleeds easily.  Psychiatric/Behavioral: Positive for memory loss. Negative for depression. The patient does not have insomnia.     Filed Vitals:   12/27/13 1553  BP: 124/62  Pulse: 64  Weight: 148 lb (67.132 kg)   Physical Exam  Constitutional: She appears well-developed and well-nourished.  HENT:  Head: Normocephalic.  Eyes: Conjunctivae are normal. Pupils are equal, round, and  reactive to light.  Neck: Normal range of motion. No JVD present. No tracheal deviation present. No thyromegaly present.  Cardiovascular: Normal rate, regular rhythm, normal heart sounds and intact distal pulses.   Pulmonary/Chest: Effort normal and breath sounds normal.  Abdominal: Bowel sounds are normal. She exhibits no mass. There is no tenderness. There is no guarding.  Musculoskeletal: Normal range of motion. She exhibits no edema.  Large ganglion of the proximal phalanx of the right fifth digit.  Some restriction in full flexion. Splint on the right 4th finger.  Lymphadenopathy:    She has no cervical adenopathy.  Neurological: She is alert. She has normal reflexes. No cranial nerve deficit. Coordination normal.  Skin: No rash noted. No erythema.  Abrasion and laceration at right eyebrow.  Psychiatric: She has a normal mood and affect. Her behavior is normal. Judgment and thought content normal.     Labs reviewed:  12/21/13 BMP: BUN 26, creat 1.13  A1c 5.7  TSH 6.096  Admission on 11/15/2013, Discharged on 11/15/2013  Component Date Value Range Status  . Color, Urine 11/15/2013 YELLOW  YELLOW Final  . APPearance 11/15/2013 CLEAR  CLEAR Final  . Specific Gravity, Urine 11/15/2013 1.017  1.005 - 1.030 Final  . pH 11/15/2013 6.0  5.0 - 8.0 Final  . Glucose, UA 11/15/2013 NEGATIVE  NEGATIVE mg/dL Final  . Hgb urine dipstick 11/15/2013 NEGATIVE  NEGATIVE Final  . Bilirubin Urine 11/15/2013 NEGATIVE  NEGATIVE Final  . Ketones, ur 11/15/2013 NEGATIVE  NEGATIVE mg/dL Final  . Protein, ur 82/95/6213 NEGATIVE  NEGATIVE mg/dL Final  . Urobilinogen, UA 11/15/2013 0.2  0.0 - 1.0 mg/dL Final  . Nitrite 08/65/7846 NEGATIVE  NEGATIVE Final  . Leukocytes, UA 11/15/2013 MODERATE* NEGATIVE Final  . Glucose-Capillary 11/15/2013 67* 70 - 99 mg/dL Final  . Squamous Epithelial / LPF 11/15/2013 FEW* RARE Final  . WBC, UA 11/15/2013 7-10  <3 WBC/hpf Final  . Bacteria, UA 11/15/2013 RARE  RARE Final      Assessment/Plan  HYPERTENSION: controlled  Hyperglycemia: recently noormal  Gait disorder: multiple falls. Unstable.  Unspecified hypothyroidism: mild elevation in the TSH.  Laceration of eyebrow and forehead, subsequent encounter: healing  Fracture of finger of right hand with routine healing: may remove splint in about 6 weeks

## 2013-12-31 ENCOUNTER — Emergency Department (HOSPITAL_COMMUNITY): Payer: Medicare Other

## 2013-12-31 ENCOUNTER — Emergency Department (HOSPITAL_COMMUNITY)
Admission: EM | Admit: 2013-12-31 | Discharge: 2013-12-31 | Disposition: A | Discharge status: Home / Self Care | Payer: Medicare Other | Attending: Emergency Medicine | Admitting: Emergency Medicine

## 2013-12-31 ENCOUNTER — Encounter (HOSPITAL_COMMUNITY): Payer: Self-pay | Admitting: Emergency Medicine

## 2013-12-31 DIAGNOSIS — I2510 Atherosclerotic heart disease of native coronary artery without angina pectoris: Secondary | ICD-10-CM | POA: Insufficient documentation

## 2013-12-31 DIAGNOSIS — Z85828 Personal history of other malignant neoplasm of skin: Secondary | ICD-10-CM | POA: Insufficient documentation

## 2013-12-31 DIAGNOSIS — S0003XA Contusion of scalp, initial encounter: Secondary | ICD-10-CM | POA: Insufficient documentation

## 2013-12-31 DIAGNOSIS — I10 Essential (primary) hypertension: Secondary | ICD-10-CM | POA: Insufficient documentation

## 2013-12-31 DIAGNOSIS — Y9389 Activity, other specified: Secondary | ICD-10-CM | POA: Insufficient documentation

## 2013-12-31 DIAGNOSIS — K219 Gastro-esophageal reflux disease without esophagitis: Secondary | ICD-10-CM | POA: Insufficient documentation

## 2013-12-31 DIAGNOSIS — S0083XA Contusion of other part of head, initial encounter: Principal | ICD-10-CM

## 2013-12-31 DIAGNOSIS — S0590XA Unspecified injury of unspecified eye and orbit, initial encounter: Secondary | ICD-10-CM | POA: Insufficient documentation

## 2013-12-31 DIAGNOSIS — R55 Syncope and collapse: Secondary | ICD-10-CM | POA: Insufficient documentation

## 2013-12-31 DIAGNOSIS — E039 Hypothyroidism, unspecified: Secondary | ICD-10-CM | POA: Insufficient documentation

## 2013-12-31 DIAGNOSIS — M1990 Unspecified osteoarthritis, unspecified site: Secondary | ICD-10-CM | POA: Insufficient documentation

## 2013-12-31 DIAGNOSIS — W19XXXA Unspecified fall, initial encounter: Secondary | ICD-10-CM

## 2013-12-31 DIAGNOSIS — Y929 Unspecified place or not applicable: Secondary | ICD-10-CM | POA: Insufficient documentation

## 2013-12-31 DIAGNOSIS — E785 Hyperlipidemia, unspecified: Secondary | ICD-10-CM | POA: Insufficient documentation

## 2013-12-31 DIAGNOSIS — H409 Unspecified glaucoma: Secondary | ICD-10-CM | POA: Insufficient documentation

## 2013-12-31 DIAGNOSIS — Z79899 Other long term (current) drug therapy: Secondary | ICD-10-CM | POA: Insufficient documentation

## 2013-12-31 DIAGNOSIS — S1093XA Contusion of unspecified part of neck, initial encounter: Principal | ICD-10-CM

## 2013-12-31 DIAGNOSIS — Z8701 Personal history of pneumonia (recurrent): Secondary | ICD-10-CM | POA: Insufficient documentation

## 2013-12-31 DIAGNOSIS — Z8781 Personal history of (healed) traumatic fracture: Secondary | ICD-10-CM | POA: Insufficient documentation

## 2013-12-31 DIAGNOSIS — Z7982 Long term (current) use of aspirin: Secondary | ICD-10-CM | POA: Insufficient documentation

## 2013-12-31 DIAGNOSIS — W1809XA Striking against other object with subsequent fall, initial encounter: Secondary | ICD-10-CM | POA: Insufficient documentation

## 2013-12-31 LAB — BASIC METABOLIC PANEL
BUN: 27 mg/dL — AB (ref 6–23)
CHLORIDE: 104 meq/L (ref 96–112)
CO2: 28 mEq/L (ref 19–32)
Calcium: 9.1 mg/dL (ref 8.4–10.5)
Creatinine, Ser: 1.04 mg/dL (ref 0.50–1.10)
GFR calc Af Amer: 53 mL/min — ABNORMAL LOW (ref >90)
GFR calc non Af Amer: 46 mL/min — ABNORMAL LOW (ref >90)
Glucose, Bld: 115 mg/dL — ABNORMAL HIGH (ref 70–99)
POTASSIUM: 5.3 meq/L (ref 3.7–5.3)
Sodium: 142 mEq/L (ref 137–147)

## 2013-12-31 LAB — GLUCOSE, CAPILLARY: Glucose-Capillary: 109 mg/dL — ABNORMAL HIGH (ref 70–99)

## 2013-12-31 LAB — CBC
HCT: 41.7 % (ref 36.0–46.0)
Hemoglobin: 14 g/dL (ref 12.0–15.0)
MCH: 32.9 pg (ref 26.0–34.0)
MCHC: 33.6 g/dL (ref 30.0–36.0)
MCV: 98.1 fL (ref 78.0–100.0)
Platelets: 232 10*3/uL (ref 150–400)
RBC: 4.25 MIL/uL (ref 3.87–5.11)
RDW: 12.8 % (ref 11.5–15.5)
WBC: 10 10*3/uL (ref 4.0–10.5)

## 2013-12-31 NOTE — ED Provider Notes (Signed)
CSN: [id number]     Arrival date & time 12/31/13  1025 History   First MD Initiated Contact with Patient 12/31/13 1032     No chief complaint on file.  (Consider location/radiation/quality/duration/timing/severity/associated sxs/prior Treatment) HPI Comments: 80 year old female with a history of atrial fibrillation presents from home by EMS with a near syncopal episode. The patient had been standing like normal counting her medicines and then acutely felt lightheaded/dizzy and fell to the ground. She states she never blacked out. She did hit the back of her head. Denies any headache, neck pain, or weakness. Denies any palpitations, chest pain, or shortness of breath. Patient is currently on Plavix. She's had issues with syncope or near-syncope but is usually from a missed dose of her beta blocker the night before. However last night she took her beta blocker is normal. EMS had initial blood pressure of 70 but then normalized. Patient currently feels asymptomatic. Of note, patient had a mechanical fall last week and injured the right side of her face. It is not related to any syncope or near-syncope symptoms but said she tripped. Her tetanus is up-to-date.   Past Medical History  Diagnosis Date  . SVT (supraventricular tachycardia)     Diagnosed 2010  . HTN (hypertension)   . Dyslipidemia   . CAD (coronary artery disease)     a. NSTEMI 2010 in setting of SVT, with cath with unsuccessful PCI - chronic total occlusion of LAD, R->L collaterals.  . GI bleeding   . Unspecified hypothyroidism   . Unspecified hereditary and idiopathic peripheral neuropathy   . Pneumonia, organism unspecified   . Basal cell carcinoma of skin of trunk, except scrotum   . Unspecified malignant neoplasm of skin of lower limb, including hip   . Basal cell carcinoma of skin of lower limb, including hip   . Unspecified glaucoma(365.9)     left eye  . Other premature beats   . Other esophagitis   . Esophageal reflux   .  Osteoarthrosis, unspecified whether generalized or localized, unspecified site     multiple joints  . Pain in joint, shoulder region   . Pain in joint, lower leg     knee  . Pain in joint, ankle and foot     right  . Sacroiliitis, not elsewhere classified   . Cervicalgia   . Lumbago   . Ganglion of tendon sheath   . Pathologic fracture of vertebrae   . Tietze's disease   . Insomnia, unspecified   . Disturbance of skin sensation   . Edema   . Palpitations   . Other nonspecific abnormal serum enzyme levels   . Nasal bones, closed fracture   . Closed fracture of unspecified trochanteric section of femur   . Open wound of knee, leg (except thigh), and ankle, without mention of complication   . Hip, thigh, leg, and ankle, abrasion or friction burn, without mention of infection   . Contusion of hip   . Fall from other slipping, tripping, or stumbling   . Personal history of fall   . Dyspepsia and other specified disorders of function of stomach   . Paroxysmal supraventricular tachycardia   . Acute bronchiolitis due to other infectious organisms 03/02/2013  . Other abnormal blood chemistry 03/15/2013  . Contusion of rib on left side 08/03/2013   Past Surgical History  Procedure Laterality Date  . Hip surgery  2001  . Breast lumpectomy  1995    right breast, negative for malignancy  .  Cataract extraction  2003    bilateral  . Leg skin lesion  biopsy / excision Left 08/22/2010    Lower leg shave biopsy -solar keratosis (Dr. Radford Pax)   Family History  Problem Relation Age of Onset  . Stroke Neg Hx   . Heart attack Neg Hx   . Heart disease Father   . Cancer Brother     lung   History  Substance Use Topics  . Smoking status: Never Smoker   . Smokeless tobacco: Never Used     Comment: denies smoking cigarettes  . Alcohol Use: Yes     Comment: drinks a scotch or wine every other day    OB History   Grav Para Term Preterm Abortions TAB SAB Ect Mult Living                  Review of Systems  Constitutional: Negative for fever and chills.  Respiratory: Negative for shortness of breath.   Cardiovascular: Negative for chest pain and palpitations.  Gastrointestinal: Negative for nausea, vomiting and abdominal pain.  Genitourinary: Negative for dysuria.  Skin: Positive for color change (bruising to right face).  Neurological: Negative for weakness and headaches.  All other systems reviewed and are negative.    Allergies  Review of patient's allergies indicates no known allergies.  Home Medications   Current Outpatient Rx  Name  Route  Sig  Dispense  Refill  . amlodipine-benazepril (LOTREL) 2.5-10 MG per capsule   Oral   Take 1 capsule by mouth daily.   30 capsule   12   . aspirin EC 81 MG tablet   Oral   Take 81 mg by mouth every evening. Take 1 tablet daily to help prevent stroke, heart attack and blood clot.         Marland Kitchen atorvastatin (LIPITOR) 20 MG tablet   Oral   Take 20 mg by mouth daily.         . brimonidine (ALPHAGAN) 0.2 % ophthalmic solution   Both Eyes   Place 1 drop into both eyes 2 (two) times daily.         Marland Kitchen ezetimibe (ZETIA) 10 MG tablet   Oral   Take 10 mg by mouth daily.         Marland Kitchen imipramine (TOFRANIL) 25 MG tablet   Oral   Take 50 mg by mouth at bedtime.         . isosorbide mononitrate (IMDUR) 60 MG 24 hr tablet   Oral   Take 60 mg by mouth daily.         Marland Kitchen latanoprost (XALATAN) 0.005 % ophthalmic solution   Both Eyes   Place 1 drop into both eyes every evening. UAD         . levothyroxine (SYNTHROID, LEVOTHROID) 50 MCG tablet   Oral   Take 1 tablet (50 mcg total) by mouth daily.   90 tablet   3   . metoprolol tartrate (LOPRESSOR) 25 MG tablet   Oral   Take 37.5 mg by mouth 2 (two) times daily.         . Multiple Vitamin (MULTIVITAMIN WITH MINERALS) TABS   Oral   Take 1 tablet by mouth daily. Certavite-A         . OVER THE COUNTER MEDICATION   Oral   Take 1 tablet by mouth at  bedtime. Sleep ease from [employer]         . pantoprazole (PROTONIX) 40 MG tablet  Take one tablet once a day for acid reflux   30 tablet   5    BP 133/79  Pulse 86  Temp(Src) 98 F (36.7 C) (Oral)  SpO2 100% Physical Exam  Nursing note and vitals reviewed. Constitutional: She is oriented to person, place, and time. She appears well-developed and well-nourished.  HENT:  Head: Normocephalic.  Right Ear: External ear normal.  Left Ear: External ear normal.  Nose: Nose normal.  Small hematoma at the posterior scalp. Has bruising to right cheek and small healing abrasion lateral to right eye  Eyes: EOM are normal. Pupils are equal, round, and reactive to light. Right eye exhibits no discharge. Left eye exhibits no discharge.  Cardiovascular: Normal rate, regular rhythm and normal heart sounds.   Pulmonary/Chest: Effort normal and breath sounds normal.  Abdominal: Soft. There is no tenderness.  Neurological: She is alert and oriented to person, place, and time. She has normal strength. No cranial nerve deficit or sensory deficit. She exhibits normal muscle tone. GCS eye subscore is 4. GCS verbal subscore is 5. GCS motor subscore is 6.  Skin: Skin is warm and dry.    ED Course  Procedures (including critical care time) Labs Review Labs Reviewed  BASIC METABOLIC PANEL - Abnormal; Notable for the following:    Glucose, Bld 115 (*)    BUN 27 (*)    GFR calc non Af Amer 46 (*)    GFR calc Af Amer 53 (*)    All other components within normal limits  GLUCOSE, CAPILLARY - Abnormal; Notable for the following:    Glucose-Capillary 109 (*)    All other components within normal limits  CBC   Imaging Review Dg Chest 2 View  12/31/2013   CLINICAL DATA:  Syncope.  Facial injury.  EXAM: CHEST  2 VIEW  COMPARISON:  12/24/2013  FINDINGS: There is hyperinflation of the lungs compatible with COPD. Heart is borderline in size. Lungs are clear. No effusions. No acute bony abnormality.  Degenerative changes in the shoulders.  IMPRESSION: COPD.  No active cardiopulmonary disease.   Electronically Signed   By: Rolm Baptise M.D.   On: 12/31/2013 11:54   Ct Head Wo Contrast  12/31/2013   CLINICAL DATA:  Fall, hit head.  Vomiting.  EXAM: CT HEAD WITHOUT CONTRAST  TECHNIQUE: Contiguous axial images were obtained from the base of the skull through the vertex without intravenous contrast.  COMPARISON:  12/24/2013  FINDINGS: There is atrophy and chronic small vessel disease changes. No acute intracranial abnormality. Specifically, no hemorrhage, hydrocephalus, mass lesion, acute infarction, or significant intracranial injury. No acute calvarial abnormality. Visualized paranasal sinuses and mastoids clear. Orbital soft tissues unremarkable.  IMPRESSION: No acute intracranial abnormality.  Atrophy, chronic microvascular disease.   Electronically Signed   By: Rolm Baptise M.D.   On: 12/31/2013 11:46    EKG Interpretation    Date/Time:  Friday December 31 2013 10:42:43 EST Ventricular Rate:  84 PR Interval:  179 QRS Duration: 122 QT Interval:  404 QTC Calculation: 478 R Axis:   -34 Text Interpretation:  Sinus rhythm Ventricular premature complex Left bundle branch block No significant change since last tracing Confirmed by Amire Gossen  MD, Dellamae Rosamilia ([id number]) on 12/31/2013 11:32:47 AM            MDM   1. Near syncope   2. Fall, initial encounter    Given that there is not an obvious etiology for her syncope (likely vasovagal given the standing) I recommended an  observation with tele and syncope w/u. Patient and daughter decline. They request discharge as this has happened before and patient does not want to be away from her husband. They both understand there is potentially a life threatening arrhythmia that could harm her if she left. They understand return precautions and will talk to their cardiologist as an outpatient.    Ephraim Hamburger, MD 12/31/13 [id number]

## 2013-12-31 NOTE — ED Notes (Signed)
Per EMS - pt coming from home. With her home health aid, pt bent over to pick medicine up then stood up very quickly, had a syncopal episode. Pt fell backwards and hit table then landed on carpet. Initial BP 70 palpated. Now BP 118 palpated. Started 20 G in left hand. Hx of a.fib, not in a.fib now. Pt on bld thinners. Denies CP/sob. Pt has small hematoma on posterior head. HR 100s.

## 2013-12-31 NOTE — ED Notes (Signed)
Pt fell about 830 am, vomited a little while after falling. sts she drank some OJ then a few mins later vomited. Per daughter and POA - pt has had multiple falls at home. Reports she falls often when she forgets to take some of her medicine the night before, and has similar symptoms to the ones she had this morning. Pt reports she did take all her meds last night.

## 2014-01-03 ENCOUNTER — Encounter: Payer: Self-pay | Admitting: Internal Medicine

## 2014-01-04 ENCOUNTER — Other Ambulatory Visit: Payer: Self-pay | Admitting: Internal Medicine

## 2014-01-10 DIAGNOSIS — S62609D Fracture of unspecified phalanx of unspecified finger, subsequent encounter for fracture with routine healing: Secondary | ICD-10-CM | POA: Insufficient documentation

## 2014-01-10 DIAGNOSIS — S01119A Laceration without foreign body of unspecified eyelid and periocular area, initial encounter: Secondary | ICD-10-CM | POA: Insufficient documentation

## 2014-01-10 DIAGNOSIS — S0181XA Laceration without foreign body of other part of head, initial encounter: Secondary | ICD-10-CM

## 2014-01-10 NOTE — Patient Instructions (Signed)
You have fallen several times. Please be very cautious. You should use a walker.

## 2014-01-18 ENCOUNTER — Telehealth: Payer: Self-pay | Admitting: Cardiology

## 2014-01-18 NOTE — Telephone Encounter (Deleted)
error 

## 2014-02-07 ENCOUNTER — Encounter: Payer: Self-pay | Admitting: Internal Medicine

## 2014-02-07 ENCOUNTER — Non-Acute Institutional Stay: Payer: Medicare Other | Admitting: Internal Medicine

## 2014-02-07 VITALS — BP 114/62 | HR 48 | Ht 66.0 in | Wt 142.0 lb

## 2014-02-07 DIAGNOSIS — IMO0001 Reserved for inherently not codable concepts without codable children: Secondary | ICD-10-CM

## 2014-02-07 DIAGNOSIS — S51809A Unspecified open wound of unspecified forearm, initial encounter: Secondary | ICD-10-CM

## 2014-02-07 DIAGNOSIS — S51819A Laceration without foreign body of unspecified forearm, initial encounter: Secondary | ICD-10-CM

## 2014-02-07 DIAGNOSIS — R269 Unspecified abnormalities of gait and mobility: Secondary | ICD-10-CM

## 2014-02-07 DIAGNOSIS — S91309A Unspecified open wound, unspecified foot, initial encounter: Secondary | ICD-10-CM

## 2014-02-07 DIAGNOSIS — I10 Essential (primary) hypertension: Secondary | ICD-10-CM

## 2014-02-07 DIAGNOSIS — S91302A Unspecified open wound, left foot, initial encounter: Secondary | ICD-10-CM

## 2014-02-07 DIAGNOSIS — S62609D Fracture of unspecified phalanx of unspecified finger, subsequent encounter for fracture with routine healing: Secondary | ICD-10-CM

## 2014-02-07 NOTE — Patient Instructions (Signed)
Use Vaseline on heel

## 2014-02-07 NOTE — Progress Notes (Signed)
[name], female   DOB: [birth year], 80 y.o.   MRN: [id number]    Location:  [facility] (12)    No Known Allergies  Chief Complaint  Patient presents with  . Medical Managment of Chronic Issues    blood pressure, syncope, multiple falls, remove splint from ring finger right hand. Doesn't have cane with her today, forgot it.   . Fall    12/31/13 went to ER had syncope, fell hit back of head, SVT    HPI:   Fracture of finger of right hand with routine healing: no pain. She is taking her splint on and off already. Significant deformity at knuckle.  Laceration of forearm: both forearms have a skin tear that appears 61-17 days old. No infection. Mild bloody drainage from the right elbow - forearm wound.  Gait disorder: unsteady. Does not use appliance regularly  HYPERTENSION: controlled  Open wound of left heel: crack in the skin of a dry callus at the left heel.    Medications: Patient's Medications  New Prescriptions   No medications on file  Previous Medications   AMLODIPINE-BENAZEPRIL (LOTREL) 2.5-10 MG PER CAPSULE    Take 1 capsule by mouth daily.   ASPIRIN EC 81 MG TABLET    Take 81 mg by mouth every evening. Take 1 tablet daily to help prevent stroke, heart attack and blood clot.   ATORVASTATIN (LIPITOR) 20 MG TABLET    Take 20 mg by mouth daily.   BRIMONIDINE (ALPHAGAN) 0.2 % OPHTHALMIC SOLUTION    Place 1 drop into both eyes 2 (two) times daily.   EZETIMIBE (ZETIA) 10 MG TABLET    Take 10 mg by mouth daily.   IMIPRAMINE (TOFRANIL) 25 MG TABLET    Take 50 mg by mouth at bedtime.   ISOSORBIDE MONONITRATE (IMDUR) 60 MG 24 HR TABLET    TAKE 1 TABLET BY MOUTH DAILY   LATANOPROST (XALATAN) 0.005 % OPHTHALMIC SOLUTION    Place 1 drop into both eyes every evening. UAD   LEVOTHYROXINE (SYNTHROID, LEVOTHROID) 50 MCG TABLET    Take 1 tablet (50 mcg total) by mouth daily.   METOPROLOL TARTRATE (LOPRESSOR) 25 MG  TABLET    Take 37.5 mg by mouth 2 (two) times daily.   MULTIPLE VITAMIN (MULTIVITAMIN WITH MINERALS) TABS    Take 1 tablet by mouth daily. Certavite-A   OVER THE COUNTER MEDICATION    Take 1 tablet by mouth at bedtime. Sleep ease from [employer]   PANTOPRAZOLE (PROTONIX) 40 MG TABLET    Take one tablet once a day for acid reflux  Modified Medications   No medications on file  Discontinued Medications   No medications on file     Review of Systems  Constitutional: Negative for fever, chills, weight loss, malaise/fatigue and diaphoresis.  HENT: Positive for hearing loss. Negative for congestion and sore throat.   Eyes: Negative for blurred vision and double vision.  Respiratory: Negative for cough, sputum production and shortness of breath.   Cardiovascular: Negative for chest pain, leg swelling and PND.  Gastrointestinal: Negative for heartburn, nausea, diarrhea and constipation.  Genitourinary: Positive for frequency. Negative for dysuria, urgency, hematuria and flank pain.  Musculoskeletal: Negative for back pain, myalgias and neck pain.  Skin: Negative for itching and rash.       Lacerations of the forearms bilaterally. And a crack in the skin of the left heel.  Neurological: Negative for tingling, tremors, sensory  change, speech change, focal weakness, seizures, weakness and headaches.  Hematological: Bruises/bleeds easily.  Psychiatric/Behavioral: Positive for memory loss. Negative for depression. The patient does not have insomnia.     Filed Vitals:   02/07/14 1559  BP: 114/62  Pulse: 48  Height: 5' 6"  (1.676 m)  Weight: 142 lb (64.411 kg)   Physical Exam  Constitutional: She appears well-developed and well-nourished.  HENT:  Head: Normocephalic.  Eyes: Conjunctivae are normal. Pupils are equal, round, and reactive to light.  Neck: Normal range of motion. No JVD present. No tracheal deviation present. No thyromegaly present.  Cardiovascular: Normal rate, regular rhythm,  normal heart sounds and intact distal pulses.  Exam reveals no gallop and no friction rub.   No murmur heard. Pulmonary/Chest: Effort normal and breath sounds normal. No respiratory distress. She has no wheezes. She has no rales.  Abdominal: Bowel sounds are normal. She exhibits no mass. There is no tenderness. There is no guarding.  Musculoskeletal: Normal range of motion. She exhibits no edema.  Large ganglion of the proximal phalanx of the right fifth digit. Some restriction in full flexion.  Lymphadenopathy:    She has no cervical adenopathy.  Neurological: She is alert. She has normal reflexes. No cranial nerve deficit. Coordination normal.  Skin: No rash noted. No erythema.  Lacerations of both forearms. Skin crack in the dry callus of the left heel.  Psychiatric: She has a normal mood and affect. Her behavior is normal. Judgment and thought content normal.     Labs reviewed: Admission on 12/31/2013, Discharged on 12/31/2013  Component Date Value Range Status  . Sodium 12/31/2013 142  137 - 147 mEq/L Final   Please note change in reference range.  . Potassium 12/31/2013 5.3  3.7 - 5.3 mEq/L Final   Please note change in reference range.  . Chloride 12/31/2013 104  96 - 112 mEq/L Final  . CO2 12/31/2013 28  19 - 32 mEq/L Final  . Glucose, Bld 12/31/2013 115* 70 - 99 mg/dL Final  . BUN 12/31/2013 27* 6 - 23 mg/dL Final  . Creatinine, Ser 12/31/2013 1.04  0.50 - 1.10 mg/dL Final  . Calcium 12/31/2013 9.1  8.4 - 10.5 mg/dL Final  . GFR calc non Af Amer 12/31/2013 46* >90 mL/min Final  . GFR calc Af Amer 12/31/2013 53* >90 mL/min Final   Comment: (NOTE)                          The eGFR has been calculated using the CKD EPI equation.                          This calculation has not been validated in all clinical situations.                          eGFR's persistently <90 mL/min signify possible Chronic Kidney                          Disease.  . WBC 12/31/2013 10.0  4.0 - 10.5  K/uL Final  . RBC 12/31/2013 4.25  3.87 - 5.11 MIL/uL Final  . Hemoglobin 12/31/2013 14.0  12.0 - 15.0 g/dL Final  . HCT 12/31/2013 41.7  36.0 - 46.0 % Final  . MCV 12/31/2013 98.1  78.0 - 100.0 fL Final  . MCH 12/31/2013 32.9  26.0 - 34.0  pg Final  . MCHC 12/31/2013 33.6  30.0 - 36.0 g/dL Final  . RDW 12/31/2013 12.8  11.5 - 15.5 % Final  . Platelets 12/31/2013 232  150 - 400 K/uL Final  . Glucose-Capillary 12/31/2013 109* 70 - 99 mg/dL Final  Nursing Home on 12/27/2013  Component Date Value Range Status  . Glucose 12/21/2013 92   Final  . BUN 12/21/2013 26* 4 - 21 mg/dL Final  . Creatinine 12/21/2013 1.1  0.5 - 1.1 mg/dL Final  . Potassium 12/21/2013 4.5  3.4 - 5.3 mmol/L Final  . Sodium 12/21/2013 139  137 - 147 mmol/L Final  . Hemoglobin A1C 12/21/2013 5.7  4.0 - 6.0 % Final  . TSH 12/21/2013 6.10* 0.41 - 5.90 uIU/mL Final  Admission on 11/15/2013, Discharged on 11/15/2013  Component Date Value Range Status  . Color, Urine 11/15/2013 YELLOW  YELLOW Final  . APPearance 11/15/2013 CLEAR  CLEAR Final  . Specific Gravity, Urine 11/15/2013 1.017  1.005 - 1.030 Final  . pH 11/15/2013 6.0  5.0 - 8.0 Final  . Glucose, UA 11/15/2013 NEGATIVE  NEGATIVE mg/dL Final  . Hgb urine dipstick 11/15/2013 NEGATIVE  NEGATIVE Final  . Bilirubin Urine 11/15/2013 NEGATIVE  NEGATIVE Final  . Ketones, ur 11/15/2013 NEGATIVE  NEGATIVE mg/dL Final  . Protein, ur 11/15/2013 NEGATIVE  NEGATIVE mg/dL Final  . Urobilinogen, UA 11/15/2013 0.2  0.0 - 1.0 mg/dL Final  . Nitrite 11/15/2013 NEGATIVE  NEGATIVE Final  . Leukocytes, UA 11/15/2013 MODERATE* NEGATIVE Final  . Glucose-Capillary 11/15/2013 67* 70 - 99 mg/dL Final  . Squamous Epithelial / LPF 11/15/2013 FEW* RARE Final  . WBC, UA 11/15/2013 7-10  <3 WBC/hpf Final  . Bacteria, UA 11/15/2013 RARE  RARE Final      Assessment/Plan  Fracture of finger of right hand with routine healing: may stop using the splint  Laceration of forearms;  healing  Gait disorder: continue to emphasize caution with movement due to multiple falls  HYPERTENSION: controlled  Open wound of left heel: [place] Vaseline each evening and apply socks until healed

## 2014-02-08 NOTE — Telephone Encounter (Signed)
Returned call to patient's daughter [name] she stated she wanted to get advice from [place].Stated her mother's health has been declining the past couple of months.Stated she forgets to take her medication some days.Stated her B/P drops.She is having pain in neck,ears get hot.Stated she does not eat or drink enough.Advised needs to drink and eat better and take medication everyday as prescribed.Advised to see PCP Dr.Green.Message sent to [place].

## 2014-02-08 NOTE — Telephone Encounter (Signed)
New message    Daughter susan calling   C/O dizziness & weakness.  pain in neck, ears get hot . Patient at [facility] .

## 2014-02-14 ENCOUNTER — Non-Acute Institutional Stay: Payer: Medicare Other | Admitting: Internal Medicine

## 2014-02-14 ENCOUNTER — Other Ambulatory Visit: Payer: Self-pay | Admitting: Internal Medicine

## 2014-02-14 VITALS — BP 118/60 | HR 56 | Wt 143.0 lb

## 2014-02-14 DIAGNOSIS — S51809A Unspecified open wound of unspecified forearm, initial encounter: Secondary | ICD-10-CM

## 2014-02-14 DIAGNOSIS — S91309A Unspecified open wound, unspecified foot, initial encounter: Secondary | ICD-10-CM

## 2014-02-14 DIAGNOSIS — I471 Supraventricular tachycardia: Secondary | ICD-10-CM

## 2014-02-14 DIAGNOSIS — I10 Essential (primary) hypertension: Secondary | ICD-10-CM

## 2014-02-14 DIAGNOSIS — R55 Syncope and collapse: Secondary | ICD-10-CM

## 2014-02-14 DIAGNOSIS — S51819A Laceration without foreign body of unspecified forearm, initial encounter: Secondary | ICD-10-CM

## 2014-02-14 DIAGNOSIS — I2510 Atherosclerotic heart disease of native coronary artery without angina pectoris: Secondary | ICD-10-CM

## 2014-02-14 DIAGNOSIS — S91302A Unspecified open wound, left foot, initial encounter: Secondary | ICD-10-CM

## 2014-02-14 NOTE — Addendum Note (Signed)
Addended by: Estill Dooms on: 02/14/2014 05:02 PM   Modules accepted: Orders

## 2014-02-14 NOTE — Progress Notes (Signed)
[name], female   DOB: 1920/02/14, 80 y.o.   MRN: [id number]    Location:  [facility] (12)    No Known Allergies  Chief Complaint  Patient presents with  . Neck Pain    the back of neck, not daily, gets weakness and ears gets hot, gets a little dizzy when that happens    HPI:  Syncope; she has had presyncope, but has not fully blacked out. Has occasions with her daughter in church that she would get weak enough to have to stop and sit down. Felt like she would black out. Denies accompanying chest pressure or palpitations. No headache or visual change. No numbness. Had Holter about 1 1/2 years ago. Has never had Carotid Doppler per patient and her daughter.  HYPERTENSION: controlled  CORONARY ATHEROSCLEROSIS NATIVE CORONARY ARTERY: known diagnosis. No angina.  PSVT: history. No recent known events.   Open wound of left heel: continues to complain of pain  Laceration of forearms: right arm is healing. Left forearm is soupy and does not look as good. Bandages have not been changed since she was here 7 days ago..    Medications: Patient's Medications  New Prescriptions   No medications on file  Previous Medications   AMLODIPINE-BENAZEPRIL (LOTREL) 2.5-10 MG PER CAPSULE    Take 1 capsule by mouth daily.   ASPIRIN EC 81 MG TABLET    Take 81 mg by mouth every evening. Take 1 tablet daily to help prevent stroke, heart attack and blood clot.   ATORVASTATIN (LIPITOR) 20 MG TABLET    Take 20 mg by mouth daily.   BRIMONIDINE (ALPHAGAN) 0.2 % OPHTHALMIC SOLUTION    Place 1 drop into both eyes 2 (two) times daily.   IMIPRAMINE (TOFRANIL) 25 MG TABLET    Take 50 mg by mouth at bedtime.   ISOSORBIDE MONONITRATE (IMDUR) 60 MG 24 HR TABLET    TAKE 1 TABLET BY MOUTH DAILY   LATANOPROST (XALATAN) 0.005 % OPHTHALMIC SOLUTION    Place 1 drop into both eyes every evening. UAD   LEVOTHYROXINE (SYNTHROID, LEVOTHROID) 50 MCG  TABLET    Take 1 tablet (50 mcg total) by mouth daily.   METOPROLOL TARTRATE (LOPRESSOR) 25 MG TABLET    Take 37.5 mg by mouth 2 (two) times daily.   MULTIPLE VITAMIN (MULTIVITAMIN WITH MINERALS) TABS    Take 1 tablet by mouth daily. Certavite-A   OVER THE COUNTER MEDICATION    Take 1 tablet by mouth at bedtime. Sleep ease from [employer]   PANTOPRAZOLE (PROTONIX) 40 MG TABLET    Take one tablet once a day for acid reflux   ZETIA 10 MG TABLET    TAKE 1 TABLET BY MOUTH DAILY TO CONTROL LIPIDS  Modified Medications   No medications on file  Discontinued Medications   No medications on file     Review of Systems  Constitutional: Negative for fever, chills, weight loss, malaise/fatigue and diaphoresis.  HENT: Positive for hearing loss. Negative for congestion and sore throat.   Eyes: Negative for blurred vision and double vision.  Respiratory: Negative for cough, sputum production and shortness of breath.   Cardiovascular: Negative for chest pain, leg swelling and PND.  Gastrointestinal: Negative for heartburn, nausea, diarrhea and constipation.  Genitourinary: Positive for frequency. Negative for dysuria, urgency, hematuria and flank pain.  Musculoskeletal: Negative for back pain, myalgias and neck pain.  Skin: Negative for itching and rash.  Lacerations of the forearms bilaterally. And a crack in the skin of the left heel.  Neurological: Negative for tingling, tremors, sensory change, speech change, focal weakness, seizures, weakness and headaches.  Hematological: Bruises/bleeds easily.  Psychiatric/Behavioral: Positive for memory loss. Negative for depression. The patient does not have insomnia.     Filed Vitals:   02/14/14 1455  BP: 118/60  Pulse: 56  Weight: 143 lb (64.864 kg)   Physical Exam  Constitutional: She appears well-developed and well-nourished.  HENT:  Head: Normocephalic.  Eyes: Conjunctivae are normal. Pupils are equal, round, and reactive to light.  Neck:  Normal range of motion. No JVD present. No tracheal deviation present. No thyromegaly present.  Cardiovascular: Normal rate, regular rhythm, normal heart sounds and intact distal pulses.  Exam reveals no gallop and no friction rub.   No murmur heard. Pulmonary/Chest: Effort normal and breath sounds normal. No respiratory distress. She has no wheezes. She has no rales.  Abdominal: Bowel sounds are normal. She exhibits no mass. There is no tenderness. There is no guarding.  Musculoskeletal: Normal range of motion. She exhibits no edema.  Large ganglion of the proximal phalanx of the right fifth digit. Some restriction in full flexion.  Lymphadenopathy:    She has no cervical adenopathy.  Neurological: She is alert. She has normal reflexes. No cranial nerve deficit. Coordination normal.  Skin: No rash noted. No erythema.  Lacerations of both forearms. Skin crack in the dry callus of the left heel.  Psychiatric: She has a normal mood and affect. Her behavior is normal. Judgment and thought content normal.     Labs reviewed: Admission on 12/31/2013, Discharged on 12/31/2013  Component Date Value Ref Range Status  . Sodium 12/31/2013 142  137 - 147 mEq/L Final   Please note change in reference range.  . Potassium 12/31/2013 5.3  3.7 - 5.3 mEq/L Final   Please note change in reference range.  . Chloride 12/31/2013 104  96 - 112 mEq/L Final  . CO2 12/31/2013 28  19 - 32 mEq/L Final  . Glucose, Bld 12/31/2013 115* 70 - 99 mg/dL Final  . BUN 12/31/2013 27* 6 - 23 mg/dL Final  . Creatinine, Ser 12/31/2013 1.04  0.50 - 1.10 mg/dL Final  . Calcium 12/31/2013 9.1  8.4 - 10.5 mg/dL Final  . GFR calc non Af Amer 12/31/2013 46* >90 mL/min Final  . GFR calc Af Amer 12/31/2013 53* >90 mL/min Final   Comment: (NOTE)                          The eGFR has been calculated using the CKD EPI equation.                          This calculation has not been validated in all clinical situations.                           eGFR's persistently <90 mL/min signify possible Chronic Kidney                          Disease.  . WBC 12/31/2013 10.0  4.0 - 10.5 K/uL Final  . RBC 12/31/2013 4.25  3.87 - 5.11 MIL/uL Final  . Hemoglobin 12/31/2013 14.0  12.0 - 15.0 g/dL Final  . HCT 12/31/2013 41.7  36.0 - 46.0 % Final  . MCV  12/31/2013 98.1  78.0 - 100.0 fL Final  . MCH 12/31/2013 32.9  26.0 - 34.0 pg Final  . MCHC 12/31/2013 33.6  30.0 - 36.0 g/dL Final  . RDW 12/31/2013 12.8  11.5 - 15.5 % Final  . Platelets 12/31/2013 232  150 - 400 K/uL Final  . Glucose-Capillary 12/31/2013 109* 70 - 99 mg/dL Final  Nursing Home on 12/27/2013  Component Date Value Ref Range Status  . Glucose 12/21/2013 92   Final  . BUN 12/21/2013 26* 4 - 21 mg/dL Final  . Creatinine 12/21/2013 1.1  0.5 - 1.1 mg/dL Final  . Potassium 12/21/2013 4.5  3.4 - 5.3 mmol/L Final  . Sodium 12/21/2013 139  137 - 147 mmol/L Final  . Hemoglobin A1C 12/21/2013 5.7  4.0 - 6.0 % Final  . TSH 12/21/2013 6.10* 0.41 - 5.90 uIU/mL Final  Admission on 11/15/2013, Discharged on 11/15/2013  Component Date Value Ref Range Status  . Color, Urine 11/15/2013 YELLOW  YELLOW Final  . APPearance 11/15/2013 CLEAR  CLEAR Final  . Specific Gravity, Urine 11/15/2013 1.017  1.005 - 1.030 Final  . pH 11/15/2013 6.0  5.0 - 8.0 Final  . Glucose, UA 11/15/2013 NEGATIVE  NEGATIVE mg/dL Final  . Hgb urine dipstick 11/15/2013 NEGATIVE  NEGATIVE Final  . Bilirubin Urine 11/15/2013 NEGATIVE  NEGATIVE Final  . Ketones, ur 11/15/2013 NEGATIVE  NEGATIVE mg/dL Final  . Protein, ur 11/15/2013 NEGATIVE  NEGATIVE mg/dL Final  . Urobilinogen, UA 11/15/2013 0.2  0.0 - 1.0 mg/dL Final  . Nitrite 11/15/2013 NEGATIVE  NEGATIVE Final  . Leukocytes, UA 11/15/2013 MODERATE* NEGATIVE Final  . Glucose-Capillary 11/15/2013 67* 70 - 99 mg/dL Final  . Squamous Epithelial / LPF 11/15/2013 FEW* RARE Final  . WBC, UA 11/15/2013 7-10  <3 WBC/hpf Final  . Bacteria, UA 11/15/2013 RARE  RARE  Final      Assessment/Plan  1. Syncope Schedule appt for Holter and Carotid Doppler   2. HYPERTENSION controlled  3. CORONARY ATHEROSCLEROSIS NATIVE CORONARY ARTERY controlled  4. PSVT No identifiable recent problems  5. Open wound of left heel Use Vaseline or medicated bad aids daily  6. Laceration of forearm Keep clean and dry. To be rechecked by [facility] clinic nurse 01/31/19/15.

## 2014-02-16 ENCOUNTER — Other Ambulatory Visit: Payer: Self-pay | Admitting: Cardiology

## 2014-02-16 ENCOUNTER — Telehealth: Payer: Self-pay

## 2014-02-16 NOTE — Telephone Encounter (Signed)
[facility], they have to wait until the monitor is available, then they while schedule both test at the same time. Gave them the daughters name and number to schedule this with Dionne Bucy [phone].  Called [name] to let her know.

## 2014-02-16 NOTE — Telephone Encounter (Signed)
Called [name] to make sure she had the appointment date and time Event monitor 2:00, Carotid doppler 2:30, on  02/17/14. She will take her mother to her appt.

## 2014-02-17 ENCOUNTER — Other Ambulatory Visit: Payer: Self-pay | Admitting: Cardiology

## 2014-02-19 ENCOUNTER — Encounter (HOSPITAL_COMMUNITY): Payer: Self-pay | Admitting: Emergency Medicine

## 2014-02-19 ENCOUNTER — Emergency Department (HOSPITAL_COMMUNITY)
Admission: EM | Admit: 2014-02-19 | Discharge: 2014-02-19 | Disposition: A | Discharge status: Home / Self Care | Payer: Medicare Other | Attending: Emergency Medicine | Admitting: Emergency Medicine

## 2014-02-19 DIAGNOSIS — Z7982 Long term (current) use of aspirin: Secondary | ICD-10-CM | POA: Insufficient documentation

## 2014-02-19 DIAGNOSIS — K219 Gastro-esophageal reflux disease without esophagitis: Secondary | ICD-10-CM | POA: Insufficient documentation

## 2014-02-19 DIAGNOSIS — Z8739 Personal history of other diseases of the musculoskeletal system and connective tissue: Secondary | ICD-10-CM | POA: Insufficient documentation

## 2014-02-19 DIAGNOSIS — R55 Syncope and collapse: Secondary | ICD-10-CM | POA: Insufficient documentation

## 2014-02-19 DIAGNOSIS — E785 Hyperlipidemia, unspecified: Secondary | ICD-10-CM | POA: Insufficient documentation

## 2014-02-19 DIAGNOSIS — Z8781 Personal history of (healed) traumatic fracture: Secondary | ICD-10-CM | POA: Insufficient documentation

## 2014-02-19 DIAGNOSIS — Z8669 Personal history of other diseases of the nervous system and sense organs: Secondary | ICD-10-CM | POA: Insufficient documentation

## 2014-02-19 DIAGNOSIS — E039 Hypothyroidism, unspecified: Secondary | ICD-10-CM | POA: Insufficient documentation

## 2014-02-19 DIAGNOSIS — I2510 Atherosclerotic heart disease of native coronary artery without angina pectoris: Secondary | ICD-10-CM | POA: Insufficient documentation

## 2014-02-19 DIAGNOSIS — Z8614 Personal history of Methicillin resistant Staphylococcus aureus infection: Secondary | ICD-10-CM | POA: Insufficient documentation

## 2014-02-19 DIAGNOSIS — R350 Frequency of micturition: Secondary | ICD-10-CM | POA: Insufficient documentation

## 2014-02-19 DIAGNOSIS — Z85820 Personal history of malignant melanoma of skin: Secondary | ICD-10-CM | POA: Insufficient documentation

## 2014-02-19 DIAGNOSIS — I10 Essential (primary) hypertension: Secondary | ICD-10-CM | POA: Insufficient documentation

## 2014-02-19 DIAGNOSIS — Z79899 Other long term (current) drug therapy: Secondary | ICD-10-CM | POA: Insufficient documentation

## 2014-02-19 LAB — COMPREHENSIVE METABOLIC PANEL
ALK PHOS: 116 U/L (ref 39–117)
ALT: 20 U/L (ref 0–35)
AST: 27 U/L (ref 0–37)
Albumin: 3.1 g/dL — ABNORMAL LOW (ref 3.5–5.2)
BUN: 27 mg/dL — AB (ref 6–23)
CO2: 27 mEq/L (ref 19–32)
Calcium: 8.8 mg/dL (ref 8.4–10.5)
Chloride: 105 mEq/L (ref 96–112)
Creatinine, Ser: 1.12 mg/dL — ABNORMAL HIGH (ref 0.50–1.10)
GFR calc non Af Amer: 42 mL/min — ABNORMAL LOW (ref >90)
GFR, EST AFRICAN AMERICAN: 48 mL/min — AB (ref >90)
Glucose, Bld: 107 mg/dL — ABNORMAL HIGH (ref 70–99)
POTASSIUM: 4.2 meq/L (ref 3.7–5.3)
Sodium: 141 mEq/L (ref 137–147)
TOTAL PROTEIN: 7.3 g/dL (ref 6.0–8.3)
Total Bilirubin: 0.3 mg/dL (ref 0.3–1.2)

## 2014-02-19 LAB — URINALYSIS, ROUTINE W REFLEX MICROSCOPIC
Bilirubin Urine: NEGATIVE
GLUCOSE, UA: NEGATIVE mg/dL
Hgb urine dipstick: NEGATIVE
KETONES UR: NEGATIVE mg/dL
LEUKOCYTES UA: NEGATIVE
NITRITE: NEGATIVE
PH: 6 (ref 5.0–8.0)
Protein, ur: NEGATIVE mg/dL
Specific Gravity, Urine: 1.02 (ref 1.005–1.030)
Urobilinogen, UA: 0.2 mg/dL (ref 0.0–1.0)

## 2014-02-19 LAB — CBC
HEMATOCRIT: 41 % (ref 36.0–46.0)
Hemoglobin: 13.6 g/dL (ref 12.0–15.0)
MCH: 33.1 pg (ref 26.0–34.0)
MCHC: 33.2 g/dL (ref 30.0–36.0)
MCV: 99.8 fL (ref 78.0–100.0)
Platelets: 191 10*3/uL (ref 150–400)
RBC: 4.11 MIL/uL (ref 3.87–5.11)
RDW: 13.7 % (ref 11.5–15.5)
WBC: 11.2 10*3/uL — ABNORMAL HIGH (ref 4.0–10.5)

## 2014-02-19 LAB — TROPONIN I

## 2014-02-19 NOTE — ED Notes (Addendum)
Pt attempting to urinate.

## 2014-02-19 NOTE — ED Notes (Signed)
Spoke to lab. Urine was just received. Tube station has been backed up

## 2014-02-19 NOTE — ED Notes (Signed)
Pt reporting to MD that she felt dizzy and fell.

## 2014-02-19 NOTE — Discharge Instructions (Signed)
Your current dose of metoprolol (lopressor) appears to be 1.5 tablets (37.5 mg) twice per day.  Decrease the dose of your metoprolol to one tablet (25 mg) 2x/day.   Rest. Drink plenty of fluids. Follow up with your doctor/cardiologist this Thursday as planned.  Have them review todays ER visit and labs, and discuss the above medication adjustment with them, as well as whether they want to make any further medication change.  Also discuss the planned Holter monitor with them.  Return to the ER right away if worse, weak/faint, chest pain, trouble breathing, fevers, other concern.    Near-Syncope Near-syncope (commonly known as near fainting) is sudden weakness, dizziness, or feeling like you might pass out. During an episode of near-syncope, you may also develop pale skin, have tunnel vision, or feel sick to your stomach (nauseous). Near-syncope may occur when getting up after sitting or while standing for a long time. It is caused by a sudden decrease in blood flow to the brain. This decrease can result from various causes or triggers, most of which are not serious. However, because near-syncope can sometimes be a sign of something serious, a medical evaluation is required. The specific cause is often not determined. HOME CARE INSTRUCTIONS  Monitor your condition for any changes. The following actions may help to alleviate any discomfort you are experiencing:  Have someone stay with you until you feel stable.  Lie down right away if you start feeling like you might faint. Breathe deeply and steadily. Wait until all the symptoms have passed. Most of these episodes last only a few minutes. You may feel tired for several hours.   Drink enough fluids to keep your urine clear or pale yellow.   If you are taking blood pressure or heart medicine, get up slowly when seated or lying down. Take several minutes to sit and then stand. This can reduce dizziness.  Follow up with your health care provider as  directed. SEEK IMMEDIATE MEDICAL CARE IF:   You have a severe headache.   You have unusual pain in the chest, abdomen, or back.   You are bleeding from the mouth or rectum, or you have black or tarry stool.   You have an irregular or very fast heartbeat.   You have repeated fainting or have seizure-like jerking during an episode.   You faint when sitting or lying down.   You have confusion.   You have difficulty walking.   You have severe weakness.   You have vision problems.  MAKE SURE YOU:   Understand these instructions.  Will watch your condition.  Will get help right away if you are not doing well or get worse. Document Released: 12/16/2005 Document Revised: 08/18/2013 Document Reviewed: 05/21/2013 [facility] Patient Information 2014 [place].

## 2014-02-19 NOTE — ED Provider Notes (Addendum)
CSN: [id number]     Arrival date & time 02/19/14  1155 History   First MD Initiated Contact with Patient 02/19/14 1158     Chief Complaint  Patient presents with  . Hypotension  . Urinary Frequency     (Consider location/radiation/quality/duration/timing/severity/associated sxs/prior Treatment) Patient is a 80 y.o. female presenting with frequency. The history is provided by the patient and a relative.  Urinary Frequency Pertinent negatives include no chest pain, no abdominal pain, no headaches and no shortness of breath.  pt with hx svt, from [facility], states was standing and had episode of feeling lightheaded as if was going to faint.  Pt notes prior to this was nauseated after eating small amt food/coffee for breakfast. ems noted cbg 168, hr 60s, and initial bp low.  Subsequent bp readings normal per ems. Pt denies palpitations or sense of irregular or fast heart beat.  No current or recent cp or discomfort of any sort. No unusual doe or fatigue. Denies any recent change in meds or new meds.  States has been having same symptoms for quite some time, had seen pcp for same in past week and is to have cardiology eval, holter, this week. Pt denies passing out or complete loc. Denies injury/pain. No cough or uri c/o. No headaches. No cp. No abd pain. No diarrhea. No dysuria or gu c/o. nofever or chills. Denies any recent blood loss or melena.      Past Medical History  Diagnosis Date  . SVT (supraventricular tachycardia)     Diagnosed 2010  . HTN (hypertension)   . Dyslipidemia   . CAD (coronary artery disease)     a. NSTEMI 2010 in setting of SVT, with cath with unsuccessful PCI - chronic total occlusion of LAD, R->L collaterals.  . GI bleeding   . Unspecified hypothyroidism   . Unspecified hereditary and idiopathic peripheral neuropathy   . Pneumonia, organism unspecified   . Basal cell carcinoma of skin of trunk, except scrotum   . Unspecified malignant neoplasm of skin of lower limb,  including hip   . Basal cell carcinoma of skin of lower limb, including hip   . Unspecified glaucoma     left eye  . Other premature beats   . Other esophagitis   . Esophageal reflux   . Osteoarthrosis, unspecified whether generalized or localized, unspecified site     multiple joints  . Pain in joint, shoulder region   . Pain in joint, lower leg     knee  . Pain in joint, ankle and foot     right  . Sacroiliitis, not elsewhere classified   . Cervicalgia   . Lumbago   . Ganglion of tendon sheath   . Pathologic fracture of vertebrae   . Tietze's disease   . Insomnia, unspecified   . Disturbance of skin sensation   . Edema   . Palpitations   . Other nonspecific abnormal serum enzyme levels   . Nasal bones, closed fracture   . Closed fracture of unspecified trochanteric section of femur   . Open wound of knee, leg (except thigh), and ankle, without mention of complication   . Hip, thigh, leg, and ankle, abrasion or friction burn, without mention of infection   . Contusion of hip   . Fall from other slipping, tripping, or stumbling   . Personal history of fall   . Dyspepsia and other specified disorders of function of stomach   . Paroxysmal supraventricular tachycardia   . Acute  bronchiolitis due to other infectious organisms 03/02/2013  . Other abnormal blood chemistry 03/15/2013  . Contusion of rib on left side 08/03/2013   Past Surgical History  Procedure Laterality Date  . Hip surgery  2001  . Breast lumpectomy  1995    right breast, negative for malignancy  . Cataract extraction  2003    bilateral  . Leg skin lesion  biopsy / excision Left 08/22/2010    Lower leg shave biopsy -solar keratosis (Dr. Radford Pax)   Family History  Problem Relation Age of Onset  . Stroke Neg Hx   . Heart attack Neg Hx   . Heart disease Father   . Cancer Brother     lung   History  Substance Use Topics  . Smoking status: Never Smoker   . Smokeless tobacco: Never Used     Comment:  denies smoking cigarettes  . Alcohol Use: Yes     Comment: drinks a scotch or wine every other day    OB History   Grav Para Term Preterm Abortions TAB SAB Ect Mult Living                 Review of Systems  Constitutional: Negative for fever and chills.  HENT: Negative for sore throat.   Eyes: Negative for redness.  Respiratory: Negative for cough and shortness of breath.   Cardiovascular: Negative for chest pain, palpitations and leg swelling.  Gastrointestinal: Positive for nausea. Negative for abdominal pain, diarrhea and blood in stool.  Genitourinary: Positive for frequency. Negative for flank pain.  Musculoskeletal: Negative for back pain and neck pain.  Skin: Negative for rash.  Neurological: Positive for light-headedness. Negative for weakness, numbness and headaches.  Hematological: Does not bruise/bleed easily.  Psychiatric/Behavioral: Negative for confusion.      Allergies  Review of patient's allergies indicates no known allergies.  Home Medications   Current Outpatient Rx  Name  Route  Sig  Dispense  Refill  . amlodipine-benazepril (LOTREL) 2.5-10 MG per capsule   Oral   Take 1 capsule by mouth daily.         Marland Kitchen aspirin EC 81 MG tablet   Oral   Take 81 mg by mouth every evening. Take 1 tablet daily to help prevent stroke, heart attack and blood clot.         Marland Kitchen atorvastatin (LIPITOR) 20 MG tablet   Oral   Take 20 mg by mouth daily.         . brimonidine (ALPHAGAN) 0.2 % ophthalmic solution   Both Eyes   Place 1 drop into both eyes 2 (two) times daily.         Marland Kitchen ezetimibe (ZETIA) 10 MG tablet   Oral   Take 10 mg by mouth daily.         Marland Kitchen imipramine (TOFRANIL) 25 MG tablet   Oral   Take 50 mg by mouth at bedtime.         . isosorbide mononitrate (IMDUR) 60 MG 24 hr tablet   Oral   Take 60 mg by mouth daily.         Marland Kitchen latanoprost (XALATAN) 0.005 % ophthalmic solution   Both Eyes   Place 1 drop into both eyes every evening. UAD          . levothyroxine (SYNTHROID, LEVOTHROID) 25 MCG tablet   Oral   Take 25 mcg by mouth daily before breakfast.         . metoprolol tartrate (LOPRESSOR)  25 MG tablet   Oral   Take 37.5 mg by mouth 2 (two) times daily.         . Multiple Vitamin (MULTIVITAMIN WITH MINERALS) TABS   Oral   Take 1 tablet by mouth daily. Certavite-A         . OVER THE COUNTER MEDICATION   Oral   Take 1 tablet by mouth at bedtime. Sleep ease from [employer]         . pantoprazole (PROTONIX) 40 MG tablet      Take one tablet once a day for acid reflux   30 tablet   5    BP 163/67  Pulse 66  Resp 20  SpO2 99% Physical Exam  Nursing note and vitals reviewed. Constitutional: She is oriented to person, place, and time. She appears well-developed and well-nourished. No distress.  HENT:  Head: Atraumatic.  Mouth/Throat: Oropharynx is clear and moist.  Eyes: Conjunctivae are normal. Pupils are equal, round, and reactive to light. No scleral icterus.  Neck: Neck supple. No tracheal deviation present. No thyromegaly present.  Cardiovascular: Normal rate, regular rhythm, normal heart sounds and intact distal pulses.   Pulmonary/Chest: Effort normal and breath sounds normal. No respiratory distress.  Abdominal: Soft. Normal appearance and bowel sounds are normal. She exhibits no distension and no mass. There is no tenderness. There is no rebound and no guarding.  Genitourinary:  No cva tenderness  Musculoskeletal: She exhibits no edema and no tenderness.  Neurological: She is alert and oriented to person, place, and time.  Skin: Skin is warm and dry. No rash noted. She is not diaphoretic.  Psychiatric: She has a normal mood and affect.    ED Course  Procedures (including critical care time)  Results for orders placed during the hospital encounter of 02/19/14  CBC      Result Value Ref Range   WBC 11.2 (*) 4.0 - 10.5 K/uL   RBC 4.11  3.87 - 5.11 MIL/uL   Hemoglobin 13.6  12.0 - 15.0 g/dL    HCT 41.0  36.0 - 46.0 %   MCV 99.8  78.0 - 100.0 fL   MCH 33.1  26.0 - 34.0 pg   MCHC 33.2  30.0 - 36.0 g/dL   RDW 13.7  11.5 - 15.5 %   Platelets 191  150 - 400 K/uL  COMPREHENSIVE METABOLIC PANEL      Result Value Ref Range   Sodium 141  137 - 147 mEq/L   Potassium 4.2  3.7 - 5.3 mEq/L   Chloride 105  96 - 112 mEq/L   CO2 27  19 - 32 mEq/L   Glucose, Bld 107 (*) 70 - 99 mg/dL   BUN 27 (*) 6 - 23 mg/dL   Creatinine, Ser 1.12 (*) 0.50 - 1.10 mg/dL   Calcium 8.8  8.4 - 10.5 mg/dL   Total Protein 7.3  6.0 - 8.3 g/dL   Albumin 3.1 (*) 3.5 - 5.2 g/dL   AST 27  0 - 37 U/L   ALT 20  0 - 35 U/L   Alkaline Phosphatase 116  39 - 117 U/L   Total Bilirubin 0.3  0.3 - 1.2 mg/dL   GFR calc non Af Amer 42 (*) >90 mL/min   GFR calc Af Amer 48 (*) >90 mL/min  URINALYSIS, ROUTINE W REFLEX MICROSCOPIC      Result Value Ref Range   Color, Urine YELLOW  YELLOW   APPearance CLEAR  CLEAR   Specific  Gravity, Urine 1.020  1.005 - 1.030   pH 6.0  5.0 - 8.0   Glucose, UA NEGATIVE  NEGATIVE mg/dL   Hgb urine dipstick NEGATIVE  NEGATIVE   Bilirubin Urine NEGATIVE  NEGATIVE   Ketones, ur NEGATIVE  NEGATIVE mg/dL   Protein, ur NEGATIVE  NEGATIVE mg/dL   Urobilinogen, UA 0.2  0.0 - 1.0 mg/dL   Nitrite NEGATIVE  NEGATIVE   Leukocytes, UA NEGATIVE  NEGATIVE  TROPONIN I      Result Value Ref Range   Troponin I <0.30  <0.30 ng/mL      EKG Interpretation    Date/Time:  Saturday February 19 2014 12:15:01 EST Ventricular Rate:  58 PR Interval:  192 QRS Duration: 130 QT Interval:  474 QTC Calculation: 466 R Axis:     Text Interpretation:  Sinus rhythm Left bundle branch block `no significant change from prior, except rate slower Confirmed by Aujanae Mccullum  MD, Aidenjames Heckmann ([id number]) on 02/19/2014 12:25:18 PM            MDM  Iv ns. Monitor. Labs. Ecg.  Reviewed nursing notes and prior charts for additional history.   Pt remains completely asymptomatic during 5 hours observation in ed.  On review prior  notes pt recently has seen pcp for same, and has been arranged to have holter monitor, carotid dopplers, and cardiology eval this Thursday.  Of note pts initial hr 50s.  Is noted to be on b blocker - on review charts, is on metoprolol 37.5 bid.  On recheck hr remains in sinus, rate in 60-80 range.  ?whether pts recurrent symptoms/near syncope related to intermittent bradycardia.   Will decrease to 25 mg bid.   Discussed plan w pt and daughter - agreeable w plan. They will follow up with cardiology this week as planned.       Mirna Mires, MD 02/19/14 [place], MD 03/01/14 [phone]

## 2014-02-19 NOTE — ED Notes (Signed)
Pt given tea and water.

## 2014-02-19 NOTE — ED Notes (Signed)
Pt from [facility]. Pt arrived via EMS. Pt was hypotensive at facility and had frequent urination. Today patient ate and immediately felt nauseated and vomited once. Pt denies nausea and vomiting since. Facility reports clear no odorous urine. Pt became hypotensive in the truck and was given 400 ml of NS. BP was 80/50 and increased to 107/56. HR- 68 CBG- 168 Pt reports drinking coffee and wine last night when the frequency occurred. Pt denies any pain. 12 lead shows LBB, LVH.  EMS reported that patient has an appt with cardiology (Hochrein) next Thursday.  20 g L FA

## 2014-02-22 ENCOUNTER — Other Ambulatory Visit: Payer: Self-pay | Admitting: Internal Medicine

## 2014-02-24 ENCOUNTER — Encounter (HOSPITAL_COMMUNITY): Payer: Medicare Other

## 2014-03-01 ENCOUNTER — Other Ambulatory Visit: Payer: Self-pay

## 2014-03-01 MED ORDER — METOPROLOL TARTRATE 25 MG PO TABS: 25.0000 mg | ORAL_TABLET | Freq: Two times a day (BID) | ORAL | Status: DC

## 2014-03-03 ENCOUNTER — Ambulatory Visit (HOSPITAL_COMMUNITY): Payer: Medicare Other | Attending: Cardiovascular Disease | Admitting: Cardiology

## 2014-03-03 ENCOUNTER — Encounter (INDEPENDENT_AMBULATORY_CARE_PROVIDER_SITE_OTHER): Payer: Medicare Other

## 2014-03-03 ENCOUNTER — Encounter: Payer: Self-pay | Admitting: *Deleted

## 2014-03-03 DIAGNOSIS — R42 Dizziness and giddiness: Secondary | ICD-10-CM

## 2014-03-03 DIAGNOSIS — I2510 Atherosclerotic heart disease of native coronary artery without angina pectoris: Secondary | ICD-10-CM

## 2014-03-03 DIAGNOSIS — R269 Unspecified abnormalities of gait and mobility: Secondary | ICD-10-CM

## 2014-03-03 DIAGNOSIS — R55 Syncope and collapse: Secondary | ICD-10-CM

## 2014-03-03 DIAGNOSIS — I6529 Occlusion and stenosis of unspecified carotid artery: Secondary | ICD-10-CM | POA: Insufficient documentation

## 2014-03-03 NOTE — Progress Notes (Signed)
Carotid duplex done.

## 2014-03-03 NOTE — Progress Notes (Signed)
[name], female   DOB: 1920-11-21, 80 y.o.   MRN: [id number] [facility] 24 hour holter monitor applied to patient.

## 2014-03-24 LAB — BASIC METABOLIC PANEL
BUN: 27 mg/dL — AB (ref 4–21)
CREATININE: 1.2 mg/dL — AB (ref 0.5–1.1)
Glucose: 89 mg/dL
Potassium: 4.9 mmol/L (ref 3.4–5.3)
Sodium: 140 mmol/L (ref 137–147)

## 2014-03-24 LAB — LIPID PANEL
Cholesterol: 129 mg/dL (ref 0–200)
HDL: 29 mg/dL — AB (ref 35–70)
LDL CALC: 69 mg/dL
LDL/HDL RATIO: 2.4
Triglycerides: 117 mg/dL (ref 40–160)

## 2014-03-28 ENCOUNTER — Encounter: Payer: Self-pay | Admitting: Internal Medicine

## 2014-04-04 ENCOUNTER — Non-Acute Institutional Stay: Payer: Medicare Other | Admitting: Internal Medicine

## 2014-04-04 ENCOUNTER — Encounter: Payer: Self-pay | Admitting: Internal Medicine

## 2014-04-04 VITALS — BP 118/62 | HR 51 | Temp 97.7°F | Wt 141.0 lb

## 2014-04-04 DIAGNOSIS — C44601 Unspecified malignant neoplasm of skin of unspecified upper limb, including shoulder: Secondary | ICD-10-CM

## 2014-04-04 DIAGNOSIS — R42 Dizziness and giddiness: Secondary | ICD-10-CM

## 2014-04-04 DIAGNOSIS — I2510 Atherosclerotic heart disease of native coronary artery without angina pectoris: Secondary | ICD-10-CM

## 2014-04-04 DIAGNOSIS — I10 Essential (primary) hypertension: Secondary | ICD-10-CM

## 2014-04-04 DIAGNOSIS — R55 Syncope and collapse: Secondary | ICD-10-CM

## 2014-04-04 DIAGNOSIS — I6529 Occlusion and stenosis of unspecified carotid artery: Secondary | ICD-10-CM

## 2014-04-04 DIAGNOSIS — E039 Hypothyroidism, unspecified: Secondary | ICD-10-CM

## 2014-04-04 DIAGNOSIS — R110 Nausea: Secondary | ICD-10-CM

## 2014-04-04 NOTE — Progress Notes (Addendum)
[name], female   DOB: [birth year], 80 y.o.   MRN: [id number]    Location:  [facility]  Place of Service: CLINIC    No Known Allergies  Chief Complaint  Patient presents with  . Medical Managment of Chronic Issues    3 month follow up for syncope and tachycardia. On 4/4 patient was dizzy and nauseated all day. Feels better today. Had Cardiology appointment in early March and had carotids check and wore cardiac monitor for the day.    HPI:  HYPERTENSION: controlled  Unspecified hypothyroidism: compensated  Dizzy: improved  Nausea alone: became nauseous after eating oysters at [facility]. Lasted a few hours, but is completely resolved now. She has eaten a normal diet today.  Skin cancer of arm: growing lesion on the left triceps area. Painless.  Syncope: nor further episodes  Carotid atherosclerosis: Doppler confirmed non obstructing <40% plaque at common carotids.    Medications: Patient's Medications  New Prescriptions   No medications on file  Previous Medications   AMLODIPINE-BENAZEPRIL (LOTREL) 2.5-10 MG PER CAPSULE    Take 1 capsule by mouth daily.   AMOXICILLIN (AMOXIL) 500 MG CAPSULE       ASPIRIN EC 81 MG TABLET    Take 81 mg by mouth every evening. Take 1 tablet daily to help prevent stroke, heart attack and blood clot.   ATORVASTATIN (LIPITOR) 20 MG TABLET    Take 20 mg by mouth daily.   BRIMONIDINE (ALPHAGAN) 0.2 % OPHTHALMIC SOLUTION    Place 1 drop into both eyes 2 (two) times daily.   EZETIMIBE (ZETIA) 10 MG TABLET    Take 10 mg by mouth daily.   IMIPRAMINE (TOFRANIL) 25 MG TABLET    Take 50 mg by mouth at bedtime.   IMIPRAMINE (TOFRANIL) 25 MG TABLET    TAKE 2 TABLETS BY MOUTH EVERY NIGHT AT BEDTIME TO REDUCE URINARY FREQUENCY.   ISOSORBIDE MONONITRATE (IMDUR) 60 MG 24 HR TABLET    Take 60 mg by mouth daily.   LATANOPROST (XALATAN) 0.005 % OPHTHALMIC SOLUTION    Place 1 drop into both eyes every evening. UAD   LEVOTHYROXINE (SYNTHROID,  LEVOTHROID) 25 MCG TABLET    Take 25 mcg by mouth daily before breakfast.   METOPROLOL TARTRATE (LOPRESSOR) 25 MG TABLET    Take 1 tablet (25 mg total) by mouth 2 (two) times daily.   MULTIPLE VITAMIN (MULTIVITAMIN WITH MINERALS) TABS    Take 1 tablet by mouth daily. Certavite-A   OMEPRAZOLE (PRILOSEC) 40 MG CAPSULE       OVER THE COUNTER MEDICATION    Take 1 tablet by mouth at bedtime. Sleep ease from [employer]   PANTOPRAZOLE (PROTONIX) 40 MG TABLET    Take one tablet once a day for acid reflux  Modified Medications   No medications on file  Discontinued Medications   No medications on file     Review of Systems  Constitutional: Negative for fever, chills, weight loss, malaise/fatigue and diaphoresis.  HENT: Positive for hearing loss. Negative for congestion and sore throat.   Eyes: Negative for blurred vision and double vision.  Respiratory: Negative for cough, sputum production and shortness of breath.   Cardiovascular: Negative for chest pain, leg swelling and PND.  Gastrointestinal: Negative for heartburn, nausea, diarrhea and constipation.  Genitourinary: Positive for frequency. Negative for dysuria, urgency, hematuria and flank pain.  Musculoskeletal: Negative for back pain, myalgias and neck pain.  Skin: Negative for itching and rash.  Growth on left triceps  Neurological: Negative for tingling, tremors, sensory change, speech change, focal weakness, seizures, weakness and headaches.  Hematological: Bruises/bleeds easily.  Psychiatric/Behavioral: Positive for memory loss. Negative for depression. The patient does not have insomnia.     Filed Vitals:   04/04/14 1555  BP: 118/62  Pulse: 51  Temp: 97.7 F (36.5 C)  TempSrc: Oral  Weight: 141 lb (63.957 kg)   Physical Exam  Constitutional: She appears well-developed and well-nourished.  HENT:  Head: Normocephalic.  Eyes: Conjunctivae are normal. Pupils are equal, round, and reactive to light.  Neck: Normal range of  motion. No JVD present. No tracheal deviation present. No thyromegaly present.  Cardiovascular: Normal rate, regular rhythm, normal heart sounds and intact distal pulses.  Exam reveals no gallop and no friction rub.   No murmur heard. Pulmonary/Chest: Effort normal and breath sounds normal. No respiratory distress. She has no wheezes. She has no rales.  Abdominal: Bowel sounds are normal. She exhibits no mass. There is no tenderness. There is no guarding.  Musculoskeletal: Normal range of motion. She exhibits no edema.  Large ganglion of the proximal phalanx of the right fifth digit. Some restriction in full flexion.  Lymphadenopathy:    She has no cervical adenopathy.  Neurological: She is alert. She has normal reflexes. No cranial nerve deficit. Coordination normal.  Skin: No rash noted. No erythema.  Lacerations of both forearms. Skin crack in the dry callus of the left heel. Skin cancer left triceps area.  Psychiatric: She has a normal mood and affect. Her behavior is normal. Judgment and thought content normal.     Labs reviewed: Nursing Home on 04/04/2014  Component Date Value Ref Range Status  . Glucose 03/24/2014 89   Final  . BUN 03/24/2014 27* 4 - 21 mg/dL Final  . Creatinine 03/24/2014 1.2* 0.5 - 1.1 mg/dL Final  . Potassium 03/24/2014 4.9  3.4 - 5.3 mmol/L Final  . Sodium 03/24/2014 140  137 - 147 mmol/L Final  . LDl/HDL Ratio 03/24/2014 2.4   Final  . Triglycerides 03/24/2014 117  40 - 160 mg/dL Final  . Cholesterol 03/24/2014 129  0 - 200 mg/dL Final  . HDL 03/24/2014 29* 35 - 70 mg/dL Final  . LDL Cholesterol 03/24/2014 69   Final  Admission on 02/19/2014, Discharged on 02/19/2014  Component Date Value Ref Range Status  . WBC 02/19/2014 11.2* 4.0 - 10.5 K/uL Final  . RBC 02/19/2014 4.11  3.87 - 5.11 MIL/uL Final  . Hemoglobin 02/19/2014 13.6  12.0 - 15.0 g/dL Final  . HCT 02/19/2014 41.0  36.0 - 46.0 % Final  . MCV 02/19/2014 99.8  78.0 - 100.0 fL Final  . MCH  02/19/2014 33.1  26.0 - 34.0 pg Final  . MCHC 02/19/2014 33.2  30.0 - 36.0 g/dL Final  . RDW 02/19/2014 13.7  11.5 - 15.5 % Final  . Platelets 02/19/2014 191  150 - 400 K/uL Final  . Sodium 02/19/2014 141  137 - 147 mEq/L Final  . Potassium 02/19/2014 4.2  3.7 - 5.3 mEq/L Final  . Chloride 02/19/2014 105  96 - 112 mEq/L Final  . CO2 02/19/2014 27  19 - 32 mEq/L Final  . Glucose, Bld 02/19/2014 107* 70 - 99 mg/dL Final  . BUN 02/19/2014 27* 6 - 23 mg/dL Final  . Creatinine, Ser 02/19/2014 1.12* 0.50 - 1.10 mg/dL Final  . Calcium 02/19/2014 8.8  8.4 - 10.5 mg/dL Final  . Total Protein 02/19/2014 7.3  6.0 - 8.3 g/dL Final  . Albumin 02/19/2014 3.1* 3.5 - 5.2 g/dL Final  . AST 02/19/2014 27  0 - 37 U/L Final  . ALT 02/19/2014 20  0 - 35 U/L Final  . Alkaline Phosphatase 02/19/2014 116  39 - 117 U/L Final  . Total Bilirubin 02/19/2014 0.3  0.3 - 1.2 mg/dL Final  . GFR calc non Af Amer 02/19/2014 42* >90 mL/min Final  . GFR calc Af Amer 02/19/2014 48* >90 mL/min Final   Comment: (NOTE)                          The eGFR has been calculated using the CKD EPI equation.                          This calculation has not been validated in all clinical situations.                          eGFR's persistently <90 mL/min signify possible Chronic Kidney                          Disease.  . Color, Urine 02/19/2014 YELLOW  YELLOW Final  . APPearance 02/19/2014 CLEAR  CLEAR Final  . Specific Gravity, Urine 02/19/2014 1.020  1.005 - 1.030 Final  . pH 02/19/2014 6.0  5.0 - 8.0 Final  . Glucose, UA 02/19/2014 NEGATIVE  NEGATIVE mg/dL Final  . Hgb urine dipstick 02/19/2014 NEGATIVE  NEGATIVE Final  . Bilirubin Urine 02/19/2014 NEGATIVE  NEGATIVE Final  . Ketones, ur 02/19/2014 NEGATIVE  NEGATIVE mg/dL Final  . Protein, ur 02/19/2014 NEGATIVE  NEGATIVE mg/dL Final  . Urobilinogen, UA 02/19/2014 0.2  0.0 - 1.0 mg/dL Final  . Nitrite 02/19/2014 NEGATIVE  NEGATIVE Final  . Leukocytes, UA 02/19/2014 NEGATIVE   NEGATIVE Final   MICROSCOPIC NOT DONE ON URINES WITH NEGATIVE PROTEIN, BLOOD, LEUKOCYTES, NITRITE, OR GLUCOSE <1000 mg/dL.  . Troponin I 02/19/2014 <0.30  <0.30 ng/mL Final   Comment:                                 Due to the release kinetics of cTnI,                          a negative result within the first hours                          of the onset of symptoms does not rule out                          myocardial infarction with certainty.                          If myocardial infarction is still suspected,                          repeat the test at appropriate intervals.      Assessment/Plan  1. HYPERTENSION controlled  2. Unspecified hypothyroidism Recheck next visit  3. Dizzy Improved  4. Nausea alone Improved on omeprazole  5. Skin cancer of arm - Ambulatory referral to Dermatology  6. Syncope Some falling episodes have been accompanied by a change in level of consciousness. It is not clear if she blacks out.  7. Carotid atherosclerosis Carotid Doppler on 03/03/14 showed mild hard plaque in RICA and LICA.

## 2014-04-05 DIAGNOSIS — C44601 Unspecified malignant neoplasm of skin of unspecified upper limb, including shoulder: Secondary | ICD-10-CM | POA: Insufficient documentation

## 2014-04-05 DIAGNOSIS — I6529 Occlusion and stenosis of unspecified carotid artery: Secondary | ICD-10-CM | POA: Insufficient documentation

## 2014-04-08 ENCOUNTER — Other Ambulatory Visit: Payer: Self-pay | Admitting: *Deleted

## 2014-04-08 DIAGNOSIS — I499 Cardiac arrhythmia, unspecified: Secondary | ICD-10-CM

## 2014-04-14 ENCOUNTER — Non-Acute Institutional Stay: Payer: Medicare Other | Admitting: Geriatric Medicine

## 2014-04-14 ENCOUNTER — Encounter: Payer: Self-pay | Admitting: Geriatric Medicine

## 2014-04-14 DIAGNOSIS — S0180XA Unspecified open wound of other part of head, initial encounter: Secondary | ICD-10-CM | POA: Insufficient documentation

## 2014-04-14 DIAGNOSIS — R269 Unspecified abnormalities of gait and mobility: Secondary | ICD-10-CM

## 2014-04-14 NOTE — Assessment & Plan Note (Signed)
Patient with chronic unsteady gait, poor balance. Continues to refuse use of walker. Exhibits poor judgment with activities. Remains very high risk to fall

## 2014-04-14 NOTE — Assessment & Plan Note (Signed)
Right-sided face with 2 large skin tears and associated contusions. Wounds have stopped bleeding today, were cleaned and dressed. Clinic nurse will monitor these wounds and change dressings every other day.

## 2014-04-14 NOTE — Progress Notes (Signed)
[name], female   DOB: Apr 04, 1920, 80 y.o.   MRN: [id number]   [name] [id number] Information   Name Relation Home Work Mobile   [name] [phone]   [name] Daughter [phone]  [phone]   [name] Son [phone]   [name] Daughter [phone]         Chief Complaint  Patient presents with  . Fall  . Contusions  . Skin tears    HPI: This is a 80 y.o. female resident of [name]  [name]  [facility].  Evaluation is requested today all of a fall in her home yesterday. Patient tells me she was chasing a spider in the house, stepped on Intralipid then somehow fell while she was reaching over to pick it up with tissue. She struck the right side of her face on the table, also apparently hit her left forearm at the same time. She was evaluated by the clinic nurse, vital signs were found to be stable, patient was able to be assisted to a sitting position and then ambulate as usual. No complaints of joint or back pain. Patient denies hitting her head, denies loss of consciousness. Wounds were dressed.  The nursing supervisor was called to the home later in the evening because the dressings had soaked through with blood. As her again cleansed and dressed I reinforced with a BD pad. 2 days ago patient had an episode of weakness and emesis in [place]. Blood pressure at the time was 108/57. The clinic nurse was called and offered the patient clinic visit time this was declined. Patient also declined [facility] to [facility] for observation. "Feeling better after some rest".     No Known Allergies  MEDICATIONS -     Medication List       This list is accurate as of: 04/14/14 10:01 AM.  Always use your most recent med list.               amlodipine-benazepril 2.5-10 MG per capsule  Commonly known as:   LOTREL  Take 1 capsule by mouth daily.     amoxicillin 500 MG capsule  Commonly known as:  AMOXIL     aspirin EC 81 MG tablet  Take 81 mg by mouth every evening. Take 1 tablet daily to help prevent stroke, heart attack and blood clot.     atorvastatin 20 MG tablet  Commonly known as:  LIPITOR  Take 20 mg by mouth daily.     brimonidine 0.2 % ophthalmic solution  Commonly known as:  ALPHAGAN  Place 1 drop into both eyes 2 (two) times daily.     ezetimibe 10 MG tablet  Commonly known as:  ZETIA  Take 10 mg by mouth daily.     imipramine 25 MG tablet  Commonly known as:  TOFRANIL  TAKE 2 TABLETS BY MOUTH EVERY NIGHT AT BEDTIME TO REDUCE URINARY FREQUENCY.     imipramine 25 MG tablet  Commonly known as:  TOFRANIL  Take 50 mg by mouth at bedtime.     isosorbide mononitrate 60 MG 24 hr tablet  Commonly known as:  IMDUR  Take 60 mg by mouth daily.     latanoprost 0.005 % ophthalmic solution  Commonly known as:  XALATAN  Place 1 drop into both eyes every evening. UAD     levothyroxine 25 MCG tablet  Commonly known as:  SYNTHROID, LEVOTHROID  Take  25 mcg by mouth daily before breakfast.     metoprolol tartrate 25 MG tablet  Commonly known as:  LOPRESSOR  Take 1 tablet (25 mg total) by mouth 2 (two) times daily.     multivitamin with minerals Tabs tablet  Take 1 tablet by mouth daily. Certavite-A     omeprazole 40 MG capsule  Commonly known as:  PRILOSEC     OVER THE COUNTER MEDICATION  Take 1 tablet by mouth at bedtime. Sleep ease from [employer]     pantoprazole 40 MG tablet  Commonly known as:  PROTONIX  Take one tablet once a day for acid reflux         DATA REVIEWED  Radiologic Exams:   Laboratory Studies:   REVIEW OF SYSTEMS  DATA OBTAINED: from patient GENERAL: Feels well  No recent fever, fatigue, change in activity status, appetite, or weight  RESPIRATORY: No cough, wheezing, SOB CARDIAC: No chest pain, palpitations. No edema GI: No abdominal  pain  No Nausea,vomiting,diarrhea or constipation  No heartburn or reflux  MUSCULOSKELETAL: No joint pain, swelling or stiffness   No back pain    No muscle ache, pain, weakness    Gait is unsteady, uses cane - doesn't like to bother with walker. Frequent falls  NEUROLOGIC: No dizziness, fainting, headache No change in mental status  PSYCHIATRIC: No feelings of anxiety, depression  Sleeps well     PHYSICAL EXAM Filed Vitals:   04/14/14 0957  BP: 120/60  Pulse: 58  Resp: 18  Weight: 139 lb 6.4 oz (63.231 kg)  SpO2: 94%   Body mass index is 22.51 kg/(m^2).  GENERAL APPEARANCE: No acute distress, appropriately groomed, normal body habitus Alert, pleasant, conversant. SKIN: No diaphoresis, rash Large skin tear right mandibular angle, partially approximated with Steri-Strips. A second skin tear along the lower right cheek mostly approximated with skipped Steri-Strips. There is associated swelling and ecchymosis with these wounds. There is a small bruise of the right buccal area. Left wrist with skin tear at the radial aspect. Approximated with Steri-Strips, continues to have mild venous bruising. Always were cleaned and dressed today. HEAD: Normocephalic, atraumatic EYES: Conjunctiva/lids clear  RESPIRATORY: Breathing is even, unlabored  Lung sounds are clear and full  CARDIOVASCULAR: Heart RRR   No murmur or extra heart sounds   EDEMA: No peripheral edema   MUSCULOSKELETAL.  Gait is unsteady, using cane PSYCHIATRIC: Mood and affect appropriate to situation, exhibits poor judgment regarding unsteady gait   ASSESSMENT/PLAN  Gait disorder Patient with chronic unsteady gait, poor balance. Continues to refuse use of walker. Exhibits poor judgment with activities. Remains very high risk to fall  Wound, open, face Right-sided face with 2 large skin tears and associated contusions. Wounds have stopped bleeding today, were cleaned and dressed. Clinic nurse will monitor these wounds and change  dressings every other day.    Family/ staff Communication:     Labs/tests ordered:    Follow up: Return for As scheduled.  Mardene Celeste, NP-C [place] [phone]  04/14/2014

## 2014-04-18 ENCOUNTER — Other Ambulatory Visit: Payer: Self-pay | Admitting: Internal Medicine

## 2014-04-19 ENCOUNTER — Other Ambulatory Visit: Payer: Self-pay | Admitting: Internal Medicine

## 2014-04-20 ENCOUNTER — Other Ambulatory Visit: Payer: Self-pay | Admitting: Internal Medicine

## 2014-04-20 DIAGNOSIS — K209 Esophagitis, unspecified without bleeding: Secondary | ICD-10-CM

## 2014-04-20 MED ORDER — OMEPRAZOLE 20 MG PO CPDR: DELAYED_RELEASE_CAPSULE | ORAL | Status: DC

## 2014-04-25 ENCOUNTER — Non-Acute Institutional Stay: Payer: Medicare Other | Admitting: Internal Medicine

## 2014-04-25 ENCOUNTER — Encounter: Payer: Self-pay | Admitting: Internal Medicine

## 2014-04-25 VITALS — BP 140/66 | HR 54 | Wt 141.0 lb

## 2014-04-25 DIAGNOSIS — I10 Essential (primary) hypertension: Secondary | ICD-10-CM

## 2014-04-25 DIAGNOSIS — I6529 Occlusion and stenosis of unspecified carotid artery: Secondary | ICD-10-CM

## 2014-04-25 DIAGNOSIS — R269 Unspecified abnormalities of gait and mobility: Secondary | ICD-10-CM

## 2014-04-25 DIAGNOSIS — I2510 Atherosclerotic heart disease of native coronary artery without angina pectoris: Secondary | ICD-10-CM

## 2014-04-25 DIAGNOSIS — R0609 Other forms of dyspnea: Secondary | ICD-10-CM

## 2014-04-25 DIAGNOSIS — R55 Syncope and collapse: Secondary | ICD-10-CM

## 2014-04-25 DIAGNOSIS — R0989 Other specified symptoms and signs involving the circulatory and respiratory systems: Secondary | ICD-10-CM

## 2014-04-25 DIAGNOSIS — R42 Dizziness and giddiness: Secondary | ICD-10-CM

## 2014-04-25 DIAGNOSIS — R110 Nausea: Secondary | ICD-10-CM

## 2014-04-25 DIAGNOSIS — R0600 Dyspnea, unspecified: Secondary | ICD-10-CM

## 2014-04-25 NOTE — Progress Notes (Signed)
[name], female   DOB: Aug 20, 1920, 80 y.o.   MRN: [id number]    Location:  [facility]  Place of Service: [facility]IC    No Known Allergies  Chief Complaint  Patient presents with  . Shortness of Breath    with walking, not all the time. Patient thinks it's the pollen. Feels good today With daughter [name]    HPI:   Dyspnea: worse with exertion  HYPERTENSION: controlled  Nausea alone: resolved  Syncope: multiple episodes of collapse of undetermined etiology. In March 2015, she had carotid Doppler that did not indicate significant occlusion. Holter showed a minimum rate of 41 and max rate of 103. There were no malignant arrhythmias.  CORONARY ATHEROSCLEROSIS NATIVE CORONARY ARTERY: no chest pains.She denies awareness of palpitations.  Carotid atherosclerosis: no significant occlusion March 2015  Dizzy: denies dizzy episodes now  Gait disorder: unsteady. At risk for falls  Pt's daughter advised me at the close of the visit that pt and her 63 year old husband continue to drive. Her children would like them to move to [place], but so far the [facility] will not discuss this.    Medications: Patient's Medications  New Prescriptions   No medications on file  Previous Medications   AMLODIPINE-BENAZEPRIL (LOTREL) 2.5-10 MG PER CAPSULE    Take 1 capsule by mouth daily.   AMOXICILLIN (AMOXIL) 500 MG CAPSULE       ASPIRIN EC 81 MG TABLET    Take 81 mg by mouth every evening. Take 1 tablet daily to help prevent stroke, heart attack and blood clot.   ATORVASTATIN (LIPITOR) 20 MG TABLET    TAKE 1 TABLET BY MOUTH DAILY TO LOWER LIPIDS   BRIMONIDINE (ALPHAGAN) 0.2 % OPHTHALMIC SOLUTION    Place 1 drop into both eyes 2 (two) times daily.   EZETIMIBE (ZETIA) 10 MG TABLET    Take 10 mg by mouth daily.   ISOSORBIDE MONONITRATE (IMDUR) 60 MG 24 HR TABLET    TAKE 1 TABLET BY MOUTH EVERY DAY   LATANOPROST (XALATAN) 0.005 % OPHTHALMIC SOLUTION    Place 1 drop into both eyes every  evening. UAD   LEVOTHYROXINE (SYNTHROID, LEVOTHROID) 25 MCG TABLET    Take 25 mcg by mouth daily before breakfast.   METOPROLOL TARTRATE (LOPRESSOR) 25 MG TABLET    Take 1 tablet (25 mg total) by mouth 2 (two) times daily.   MULTIPLE VITAMIN (MULTIVITAMIN WITH MINERALS) TABS    Take 1 tablet by mouth daily. Certavite-A   OMEPRAZOLE (PRILOSEC) 20 MG CAPSULE    Take 2 capsules together once daily to reduce stomach acid   OVER THE COUNTER MEDICATION    Take 1 tablet by mouth at bedtime. Sleep ease from [employer]   PANTOPRAZOLE (PROTONIX) 40 MG TABLET    Take one tablet once a day for acid reflux  Modified Medications   No medications on file  Discontinued Medications   IMIPRAMINE (TOFRANIL) 25 MG TABLET    Take 50 mg by mouth at bedtime.   IMIPRAMINE (TOFRANIL) 25 MG TABLET    TAKE 2 TABLETS BY MOUTH EVERY NIGHT AT BEDTIME TO REDUCE URINARY FREQUENCY.   ISOSORBIDE MONONITRATE (IMDUR) 60 MG 24 HR TABLET    Take 60 mg by mouth daily.     Review of Systems  Constitutional: Negative for fever, chills, weight loss, malaise/fatigue and diaphoresis.  HENT: Positive for hearing loss. Negative for congestion and sore throat.   Eyes: Negative for blurred vision and double vision.  Respiratory: Negative for cough, sputum production and shortness of breath.   Cardiovascular: Negative for chest pain, leg swelling and PND.  Gastrointestinal: Negative for heartburn, nausea, diarrhea and constipation.  Genitourinary: Positive for frequency. Negative for dysuria, urgency, hematuria and flank pain.  Musculoskeletal: Negative for back pain, myalgias and neck pain.  Skin: Negative for itching and rash.       Growth on left triceps  Neurological: Negative for tingling, tremors, sensory change, speech change, focal weakness, seizures, weakness and headaches.  Hematological: Bruises/bleeds easily.  Psychiatric/Behavioral: Positive for memory loss. Negative for depression. The patient does not have insomnia.      Filed Vitals:   04/25/14 1646 04/25/14 1648  BP: 120/58 140/66  Pulse: 75 54  Weight: 141 lb (63.957 kg)   SpO2: 85% 98%   Physical Exam  Constitutional: She appears well-developed and well-nourished.  HENT:  Bilateral hearing loss  Eyes: Conjunctivae are normal. Pupils are equal, round, and reactive to light.  Neck: Normal range of motion. No JVD present. No tracheal deviation present. No thyromegaly present.  Cardiovascular: Normal rate, regular rhythm, normal heart sounds and intact distal pulses.  Exam reveals no gallop and no friction rub.   No murmur heard. Pulmonary/Chest: Effort normal and breath sounds normal. No respiratory distress. She has no wheezes. She has no rales.  Abdominal: Bowel sounds are normal. She exhibits no mass. There is no tenderness. There is no guarding.  Musculoskeletal: Normal range of motion. She exhibits no edema.  Large ganglion of the proximal phalanx of the right fifth digit. Some restriction in full flexion. Unstable gait.  Lymphadenopathy:    She has no cervical adenopathy.  Neurological: She is alert. She has normal reflexes. No cranial nerve deficit. Coordination normal.  Memory loss.  Skin: No rash noted. No erythema.  Discolored skin in legs and arms from multiple injuries, sun damage, and skin cancers.  Psychiatric: She has a normal mood and affect. Her behavior is normal. Judgment and thought content normal.     Labs reviewed: Nursing Home on 04/04/2014  Component Date Value Ref Range Status  . Glucose 03/24/2014 89   Final  . BUN 03/24/2014 27* 4 - 21 mg/dL Final  . Creatinine 03/24/2014 1.2* 0.5 - 1.1 mg/dL Final  . Potassium 03/24/2014 4.9  3.4 - 5.3 mmol/L Final  . Sodium 03/24/2014 140  137 - 147 mmol/L Final  . LDl/HDL Ratio 03/24/2014 2.4   Final  . Triglycerides 03/24/2014 117  40 - 160 mg/dL Final  . Cholesterol 03/24/2014 129  0 - 200 mg/dL Final  . HDL 03/24/2014 29* 35 - 70 mg/dL Final  . LDL Cholesterol  03/24/2014 69   Final  Admission on 02/19/2014, Discharged on 02/19/2014  Component Date Value Ref Range Status  . WBC 02/19/2014 11.2* 4.0 - 10.5 K/uL Final  . RBC 02/19/2014 4.11  3.87 - 5.11 MIL/uL Final  . Hemoglobin 02/19/2014 13.6  12.0 - 15.0 g/dL Final  . HCT 02/19/2014 41.0  36.0 - 46.0 % Final  . MCV 02/19/2014 99.8  78.0 - 100.0 fL Final  . MCH 02/19/2014 33.1  26.0 - 34.0 pg Final  . MCHC 02/19/2014 33.2  30.0 - 36.0 g/dL Final  . RDW 02/19/2014 13.7  11.5 - 15.5 % Final  . Platelets 02/19/2014 191  150 - 400 K/uL Final  . Sodium 02/19/2014 141  137 - 147 mEq/L Final  . Potassium 02/19/2014 4.2  3.7 - 5.3 mEq/L Final  . Chloride 02/19/2014 105  96 - 112 mEq/L Final  . CO2 02/19/2014 27  19 - 32 mEq/L Final  . Glucose, Bld 02/19/2014 107* 70 - 99 mg/dL Final  . BUN 02/19/2014 27* 6 - 23 mg/dL Final  . Creatinine, Ser 02/19/2014 1.12* 0.50 - 1.10 mg/dL Final  . Calcium 02/19/2014 8.8  8.4 - 10.5 mg/dL Final  . Total Protein 02/19/2014 7.3  6.0 - 8.3 g/dL Final  . Albumin 02/19/2014 3.1* 3.5 - 5.2 g/dL Final  . AST 02/19/2014 27  0 - 37 U/L Final  . ALT 02/19/2014 20  0 - 35 U/L Final  . Alkaline Phosphatase 02/19/2014 116  39 - 117 U/L Final  . Total Bilirubin 02/19/2014 0.3  0.3 - 1.2 mg/dL Final  . GFR calc non Af Amer 02/19/2014 42* >90 mL/min Final  . GFR calc Af Amer 02/19/2014 48* >90 mL/min Final   Comment: (NOTE)                          The eGFR has been calculated using the CKD EPI equation.                          This calculation has not been validated in all clinical situations.                          eGFR's persistently <90 mL/min signify possible Chronic Kidney                          Disease.  . Color, Urine 02/19/2014 YELLOW  YELLOW Final  . APPearance 02/19/2014 CLEAR  CLEAR Final  . Specific Gravity, Urine 02/19/2014 1.020  1.005 - 1.030 Final  . pH 02/19/2014 6.0  5.0 - 8.0 Final  . Glucose, UA 02/19/2014 NEGATIVE  NEGATIVE mg/dL Final  . Hgb  urine dipstick 02/19/2014 NEGATIVE  NEGATIVE Final  . Bilirubin Urine 02/19/2014 NEGATIVE  NEGATIVE Final  . Ketones, ur 02/19/2014 NEGATIVE  NEGATIVE mg/dL Final  . Protein, ur 02/19/2014 NEGATIVE  NEGATIVE mg/dL Final  . Urobilinogen, UA 02/19/2014 0.2  0.0 - 1.0 mg/dL Final  . Nitrite 02/19/2014 NEGATIVE  NEGATIVE Final  . Leukocytes, UA 02/19/2014 NEGATIVE  NEGATIVE Final   MICROSCOPIC NOT DONE ON URINES WITH NEGATIVE PROTEIN, BLOOD, LEUKOCYTES, NITRITE, OR GLUCOSE <1000 mg/dL.  . Troponin I 02/19/2014 <0.30  <0.30 ng/mL Final   Comment:                                 Due to the release kinetics of cTnI,                          a negative result within the first hours                          of the onset of symptoms does not rule out                          myocardial infarction with certainty.                          If myocardial infarction is still suspected,  repeat the test at appropriate intervals.      Assessment/Plan  1. Dyspnea O2 sat remained adequate as she walked in the hall up to 250 feet. She says she feels fine today and that she is sure that the problem breathing recently was due to heavy pollen fall.  2. HYPERTENSION controlled  3. Nausea alone Resolved  4. Syncope multiple episodes over the last couple of years. Etiology has never been established.  5. CORONARY ATHEROSCLEROSIS NATIVE CORONARY ARTERY No angina or palpitations  6. Carotid atherosclerosis No significant occlusion  7. Dizzy Denies any recent episodes  8. Gait disorder Unstable . At risk for falls.   I advised patient and her daughter that I think Mr. And Mrs. [facility] should move to Assisted Living. They are both medically frail. She has mild memory impairment.   I also stated that they should give up driving.  Patient is firmly opposed to making any change in living arrangement. I do not think that either patient will willingly make any change in the  near future.

## 2014-04-27 ENCOUNTER — Encounter: Payer: Self-pay | Admitting: Internal Medicine

## 2014-05-09 ENCOUNTER — Other Ambulatory Visit: Payer: Self-pay | Admitting: Cardiology

## 2014-05-09 ENCOUNTER — Ambulatory Visit (INDEPENDENT_AMBULATORY_CARE_PROVIDER_SITE_OTHER): Payer: Medicare Other | Admitting: Cardiology

## 2014-05-09 ENCOUNTER — Encounter: Payer: Self-pay | Admitting: Cardiology

## 2014-05-09 VITALS — BP 120/60 | HR 56 | Ht 66.0 in | Wt 141.4 lb

## 2014-05-09 DIAGNOSIS — I959 Hypotension, unspecified: Secondary | ICD-10-CM

## 2014-05-09 DIAGNOSIS — R55 Syncope and collapse: Secondary | ICD-10-CM

## 2014-05-09 DIAGNOSIS — I6529 Occlusion and stenosis of unspecified carotid artery: Secondary | ICD-10-CM

## 2014-05-09 DIAGNOSIS — I2510 Atherosclerotic heart disease of native coronary artery without angina pectoris: Secondary | ICD-10-CM

## 2014-05-09 DIAGNOSIS — I10 Essential (primary) hypertension: Secondary | ICD-10-CM

## 2014-05-09 NOTE — Patient Instructions (Signed)
Your physician recommends that you continue on your current medications as directed. Please refer to the Current Medication list given to you today.  Your physician wants you to follow-up in: [address] [place]. You will receive a reminder letter in the mail two months in advance. If you don't receive a letter, please call our office to schedule the follow-up appointment.

## 2014-05-09 NOTE — Progress Notes (Signed)
HPI The patient has a history of supraventricular tachycardia. Since I last saw her she has had several mechanical falls.  She thinks that there might have been one episode of syncope.  However, she is not at all clear on this.  I did review an ED record from Feb.  Of note after that visit carotid Dopplers were ordered and showed mild to moderate bilateral plaque.  Also monitor showed rare atrial ectopy.   At that visit following a fall she had her dose of beta blocker reduced.   She reports most of the episodes have been secondary to missteps such as once when she went to get a spider.  She might be having occasional palpitations like her SVT.  However, her son reports that this seems to haven when she forgets her medications.  When this happens she has to take to the bed for the next day.  She does have some DOE that seems to be progressive.  However, these are not resting complaints.   No Known Allergies  Current Outpatient Prescriptions  Medication Sig Dispense Refill  . amlodipine-benazepril (LOTREL) 2.5-10 MG per capsule Take 1 capsule by mouth daily.      Marland Kitchen amoxicillin (AMOXIL) 500 MG capsule       . aspirin EC 81 MG tablet Take 81 mg by mouth every evening. Take 1 tablet daily to help prevent stroke, heart attack and blood clot.      Marland Kitchen atorvastatin (LIPITOR) 20 MG tablet TAKE 1 TABLET BY MOUTH DAILY TO LOWER LIPIDS  30 tablet  3  . brimonidine (ALPHAGAN) 0.2 % ophthalmic solution Place 1 drop into both eyes 2 (two) times daily.      Marland Kitchen ezetimibe (ZETIA) 10 MG tablet Take 10 mg by mouth daily.      . isosorbide mononitrate (IMDUR) 60 MG 24 hr tablet TAKE 1 TABLET BY MOUTH EVERY DAY  30 tablet  3  . latanoprost (XALATAN) 0.005 % ophthalmic solution Place 1 drop into both eyes every evening. UAD      . levothyroxine (SYNTHROID, LEVOTHROID) 25 MCG tablet Take 25 mcg by mouth daily before breakfast.      . metoprolol tartrate (LOPRESSOR) 25 MG tablet Take 1 tablet (25 mg total) by mouth 2 (two)  times daily.  30 tablet  6  . Multiple Vitamin (MULTIVITAMIN WITH MINERALS) TABS Take 1 tablet by mouth daily. Certavite-A      . omeprazole (PRILOSEC) 20 MG capsule Take 2 capsules together once daily to reduce stomach acid  180 capsule  3  . OVER THE COUNTER MEDICATION Take 1 tablet by mouth at bedtime. Sleep ease from [employer]      . pantoprazole (PROTONIX) 40 MG tablet Take one tablet once a day for acid reflux  30 tablet  5   No current facility-administered medications for this visit.    Past Medical History  Diagnosis Date  . SVT (supraventricular tachycardia)     Diagnosed 2010  . HTN (hypertension)   . Dyslipidemia   . CAD (coronary artery disease)     a. NSTEMI 2010 in setting of SVT, with cath with unsuccessful PCI - chronic total occlusion of LAD, R->L collaterals.  . GI bleeding   . Unspecified hypothyroidism   . Unspecified hereditary and idiopathic peripheral neuropathy   . Pneumonia, organism unspecified   . Basal cell carcinoma of skin of trunk, except scrotum   . Unspecified malignant neoplasm of skin of lower limb, including hip   .  Basal cell carcinoma of skin of lower limb, including hip   . Unspecified glaucoma     left eye  . Other premature beats   . Other esophagitis   . Esophageal reflux   . Osteoarthrosis, unspecified whether generalized or localized, unspecified site     multiple joints  . Pain in joint, shoulder region   . Pain in joint, lower leg     knee  . Pain in joint, ankle and foot     right  . Sacroiliitis, not elsewhere classified   . Cervicalgia   . Lumbago   . Ganglion of tendon sheath   . Pathologic fracture of vertebrae   . Tietze's disease   . Insomnia, unspecified   . Disturbance of skin sensation   . Edema   . Palpitations   . Other nonspecific abnormal serum enzyme levels   . Nasal bones, closed fracture   . Closed fracture of unspecified trochanteric section of femur   . Open wound of knee, leg (except thigh), and ankle,  without mention of complication   . Hip, thigh, leg, and ankle, abrasion or friction burn, without mention of infection   . Contusion of hip   . Fall from other slipping, tripping, or stumbling   . Personal history of fall   . Dyspepsia and other specified disorders of function of stomach   . Paroxysmal supraventricular tachycardia   . Acute bronchiolitis due to other infectious organisms 03/02/2013  . Other abnormal blood chemistry 03/15/2013  . Contusion of rib on left side 08/03/2013    ROS:  As stated in the HPI and negative for all other systems.  PHYSICAL EXAM There were no vitals taken for this visit. GENERAL:  Well appearing NECK:  No jugular venous distention, waveform within normal limits, carotid upstroke brisk and symmetric, no bruits, no thyromegaly LUNGS:  Clear to auscultation bilaterally CHEST:  Unremarkable HEART:  PMI not displaced or sustained,S1 and S2 within normal limits, no S3, no S4, no clicks, no rubs, brief apical systolic murmur, no diastolic murmurs ABD:  Flat, positive bowel sounds normal in frequency in pitch, no bruits, no rebound, no guarding, no midline pulsatile mass, no hepatomegaly, no splenomegaly EXT:  2 plus pulses upper and decreased DP/PT legs, no edema, no cyanosis no clubbing, bruising  ASSESSMENT AND PLAN  PSVT -  The patient might be having symptoms of this.  However, she seems to have this when she forgets her medications.  At this point I am not planning further monitoring.   HYPERTENSION -  Her blood pressure is well controlled on current regimen. No change in therapy is indicated.  FALLS - There is no clear evidence of syncope/LOC.  She does not have orthostasis.  I think that these are mechanical falls and we discussed this at length.    CAROTID STENOSIS- This can be followed in one year.

## 2014-05-24 ENCOUNTER — Emergency Department (HOSPITAL_COMMUNITY): Payer: Medicare Other

## 2014-05-24 ENCOUNTER — Observation Stay (HOSPITAL_COMMUNITY)
Admission: EM | Admit: 2014-05-24 | Discharge: 2014-05-24 | Disposition: A | Discharge status: Nursing Home (Includes ICF) | Payer: Medicare Other | Attending: Internal Medicine | Admitting: Internal Medicine

## 2014-05-24 ENCOUNTER — Encounter (HOSPITAL_COMMUNITY): Payer: Self-pay | Admitting: Emergency Medicine

## 2014-05-24 DIAGNOSIS — I2510 Atherosclerotic heart disease of native coronary artery without angina pectoris: Secondary | ICD-10-CM | POA: Insufficient documentation

## 2014-05-24 DIAGNOSIS — I959 Hypotension, unspecified: Principal | ICD-10-CM | POA: Insufficient documentation

## 2014-05-24 DIAGNOSIS — K219 Gastro-esophageal reflux disease without esophagitis: Secondary | ICD-10-CM | POA: Insufficient documentation

## 2014-05-24 DIAGNOSIS — E785 Hyperlipidemia, unspecified: Secondary | ICD-10-CM | POA: Insufficient documentation

## 2014-05-24 DIAGNOSIS — R002 Palpitations: Secondary | ICD-10-CM | POA: Diagnosis present

## 2014-05-24 DIAGNOSIS — I252 Old myocardial infarction: Secondary | ICD-10-CM | POA: Insufficient documentation

## 2014-05-24 DIAGNOSIS — Z7902 Long term (current) use of antithrombotics/antiplatelets: Secondary | ICD-10-CM | POA: Insufficient documentation

## 2014-05-24 DIAGNOSIS — I10 Essential (primary) hypertension: Secondary | ICD-10-CM | POA: Insufficient documentation

## 2014-05-24 DIAGNOSIS — E039 Hypothyroidism, unspecified: Secondary | ICD-10-CM | POA: Insufficient documentation

## 2014-05-24 DIAGNOSIS — Z9181 History of falling: Secondary | ICD-10-CM | POA: Insufficient documentation

## 2014-05-24 DIAGNOSIS — R000 Tachycardia, unspecified: Secondary | ICD-10-CM

## 2014-05-24 DIAGNOSIS — I498 Other specified cardiac arrhythmias: Secondary | ICD-10-CM | POA: Insufficient documentation

## 2014-05-24 DIAGNOSIS — Z9861 Coronary angioplasty status: Secondary | ICD-10-CM | POA: Insufficient documentation

## 2014-05-24 LAB — CBC WITH DIFFERENTIAL/PLATELET
Basophils Absolute: 0 10*3/uL (ref 0.0–0.1)
Basophils Relative: 0 % (ref 0–1)
EOS ABS: 0.2 10*3/uL (ref 0.0–0.7)
Eosinophils Relative: 2 % (ref 0–5)
HCT: 39.9 % (ref 36.0–46.0)
Hemoglobin: 12.8 g/dL (ref 12.0–15.0)
Lymphocytes Relative: 13 % (ref 12–46)
Lymphs Abs: 1 10*3/uL (ref 0.7–4.0)
MCH: 32.5 pg (ref 26.0–34.0)
MCHC: 32.1 g/dL (ref 30.0–36.0)
MCV: 101.3 fL — ABNORMAL HIGH (ref 78.0–100.0)
Monocytes Absolute: 0.5 10*3/uL (ref 0.1–1.0)
Monocytes Relative: 7 % (ref 3–12)
NEUTROS ABS: 5.9 10*3/uL (ref 1.7–7.7)
NEUTROS PCT: 78 % — AB (ref 43–77)
PLATELETS: 180 10*3/uL (ref 150–400)
RBC: 3.94 MIL/uL (ref 3.87–5.11)
RDW: 13 % (ref 11.5–15.5)
WBC: 7.6 10*3/uL (ref 4.0–10.5)

## 2014-05-24 LAB — TSH: TSH: 4.31 u[IU]/mL (ref 0.350–4.500)

## 2014-05-24 LAB — BASIC METABOLIC PANEL
BUN: 25 mg/dL — ABNORMAL HIGH (ref 6–23)
CHLORIDE: 105 meq/L (ref 96–112)
CO2: 27 mEq/L (ref 19–32)
Calcium: 9.1 mg/dL (ref 8.4–10.5)
Creatinine, Ser: 1.23 mg/dL — ABNORMAL HIGH (ref 0.50–1.10)
GFR calc Af Amer: 43 mL/min — ABNORMAL LOW (ref >90)
GFR, EST NON AFRICAN AMERICAN: 37 mL/min — AB (ref >90)
Glucose, Bld: 127 mg/dL — ABNORMAL HIGH (ref 70–99)
POTASSIUM: 4.5 meq/L (ref 3.7–5.3)
SODIUM: 141 meq/L (ref 137–147)

## 2014-05-24 LAB — TROPONIN I

## 2014-05-24 MED ORDER — SODIUM CHLORIDE 0.9 % IV BOLUS (SEPSIS)
500.0000 mL | Freq: Once | INTRAVENOUS | Status: AC
  Administered 2014-05-24: 500 mL via INTRAVENOUS

## 2014-05-24 NOTE — Discharge Instructions (Signed)
Your heart rate and blood pressure returned to normal in the ED. Although we would like for you to be further observed in the hospital, you want to go home and are aware that you could go back into the fast heart rate that makes you feel dizzy, tired, and your heart could become fatigued if it does not go back into a normal rate on its own like it did today. You were observed for 4-5 hours in the ED and did not return to this fast heart rate.  For now, hold your lotrel until you are seen by your cardiologist. Call tomorrow for cardiology appt next week with Dr Percival Spanish. If you develop persistent return of symptoms, seek immediate care. Always be in an environment of safety if you develop fatigue/palpitations again so you do not fall or hurt yourself.

## 2014-05-24 NOTE — ED Notes (Signed)
Patient ambulated well down the hall and back to her room

## 2014-05-24 NOTE — ED Provider Notes (Signed)
CSN: [id number]     Arrival date & time 05/24/14  0913 History   First MD Initiated Contact with Patient 05/24/14 0914     Chief Complaint  Patient presents with  . Weakness  . Hypotension  . Dizziness   HPI  80 y.o. female with complex PMH including SVT, CAD with NSTEMI 2010 here with palpitations and generalized weakness since this morning. She has had palpitations in the past with SVT that felt like this. Denies focal weakness, chest pain, dizziness, syncope, dyspnea, fevers, chills, nausea, vomiting, diarrhea, or change in PO intake. Denies any pain. Reports that she eats and drinks regularly but missed breakfast this morning. Has not had any med changes lately. She lives in an ALF where they help her administer medications, and she got her BP medication this morning prior to onset of symptoms. She is relatively sure she did not get double dose of meds. She has h/o falls that cardiology did not think was orthostasis but rather "mechanical" in nature at 04/2014 visit.  On arrival of EMS, BP was 67T systolic and pulse was 245Y. Pt was dosed 500cc normal saline and BP improved to 09X systolic with pulse remaining in 140s until arrival to ED when it spontaneously changed to 70s.  Past Medical History  Diagnosis Date  . SVT (supraventricular tachycardia)     Diagnosed 2010  . HTN (hypertension)   . Dyslipidemia   . CAD (coronary artery disease)     a. NSTEMI 2010 in setting of SVT, with cath with unsuccessful PCI - chronic total occlusion of LAD, R->L collaterals.  . GI bleeding   . Unspecified hypothyroidism   . Unspecified hereditary and idiopathic peripheral neuropathy   . Pneumonia, organism unspecified   . Basal cell carcinoma of skin of trunk, except scrotum   . Unspecified malignant neoplasm of skin of lower limb, including hip   . Basal cell carcinoma of skin of lower limb, including hip   . Unspecified glaucoma     left eye  . Other premature beats   . Other esophagitis   .  Esophageal reflux   . Osteoarthrosis, unspecified whether generalized or localized, unspecified site     multiple joints  . Pain in joint, shoulder region   . Pain in joint, lower leg     knee  . Pain in joint, ankle and foot     right  . Sacroiliitis, not elsewhere classified   . Cervicalgia   . Lumbago   . Ganglion of tendon sheath   . Pathologic fracture of vertebrae   . Tietze's disease   . Insomnia, unspecified   . Disturbance of skin sensation   . Edema   . Palpitations   . Other nonspecific abnormal serum enzyme levels   . Nasal bones, closed fracture   . Closed fracture of unspecified trochanteric section of femur   . Open wound of knee, leg (except thigh), and ankle, without mention of complication   . Hip, thigh, leg, and ankle, abrasion or friction burn, without mention of infection   . Contusion of hip   . Fall from other slipping, tripping, or stumbling   . Personal history of fall   . Dyspepsia and other specified disorders of function of stomach   . Paroxysmal supraventricular tachycardia   . Acute bronchiolitis due to other infectious organisms 03/02/2013  . Other abnormal blood chemistry 03/15/2013  . Contusion of rib on left side 08/03/2013   Past Surgical History  Procedure Laterality  Date  . Hip surgery  2001  . Breast lumpectomy  1995    right breast, negative for malignancy  . Cataract extraction  2003    bilateral  . Leg skin lesion  biopsy / excision Left 08/22/2010    Lower leg shave biopsy -solar keratosis (Dr. Radford Pax)   Family History  Problem Relation Age of Onset  . Stroke Neg Hx   . Heart attack Neg Hx   . Heart disease Father   . Cancer Brother     lung   History  Substance Use Topics  . Smoking status: Never Smoker   . Smokeless tobacco: Never Used     Comment: denies smoking cigarettes  . Alcohol Use: Yes     Comment: drinks a scotch or wine every other day    OB History   Grav Para Term Preterm Abortions TAB SAB Ect Mult  Living                 Review of Systems  All other systems reviewed and are negative.   Allergies  Review of patient's allergies indicates no known allergies.  Home Medications   Prior to Admission medications   Medication Sig Start Date End Date Taking? Authorizing Provider  amlodipine-benazepril (LOTREL) 2.5-10 MG per capsule Take 1 capsule by mouth daily.    Historical Provider, MD  amoxicillin (AMOXIL) 500 MG capsule 4 CAPSULES 1 HR PRIOR TO DENTAL 03/01/14   Historical Provider, MD  aspirin EC 81 MG tablet Take 81 mg by mouth every evening. Take 1 tablet daily to help prevent stroke, heart attack and blood clot.    Historical Provider, MD  atorvastatin (LIPITOR) 20 MG tablet TAKE 1 TABLET BY MOUTH DAILY TO LOWER LIPIDS 04/18/14   Estill Dooms, MD  brimonidine (ALPHAGAN) 0.2 % ophthalmic solution Place 1 drop into both eyes 2 (two) times daily.    Historical Provider, MD  ezetimibe (ZETIA) 10 MG tablet Take 10 mg by mouth daily.    Historical Provider, MD  imipramine (TOFRANIL) 25 MG tablet Take 25 mg by mouth at bedtime.  03/21/14   Historical Provider, MD  isosorbide mononitrate (IMDUR) 60 MG 24 hr tablet TAKE 1 TABLET BY MOUTH EVERY DAY 04/18/14   Estill Dooms, MD  latanoprost (XALATAN) 0.005 % ophthalmic solution Place 1 drop into both eyes every evening. UAD    Historical Provider, MD  levothyroxine (SYNTHROID, LEVOTHROID) 25 MCG tablet Take 25 mcg by mouth daily before breakfast.    Historical Provider, MD  metoprolol tartrate (LOPRESSOR) 25 MG tablet Take 1 tablet (25 mg total) by mouth 2 (two) times daily. 03/01/14   Minus Breeding, MD  Multiple Vitamin (MULTIVITAMIN WITH MINERALS) TABS Take 1 tablet by mouth daily. Certavite-A    Historical Provider, MD  omeprazole (PRILOSEC) 20 MG capsule Take 2 capsules together once daily to reduce stomach acid 04/20/14   Estill Dooms, MD  OVER THE COUNTER MEDICATION Take 1 tablet by mouth at bedtime. Sleep ease from [employer]    Historical  Provider, MD  pantoprazole (PROTONIX) 40 MG tablet Take one tablet once a day for acid reflux 10/18/13   Pricilla Larsson, NP   BP 136/74  Pulse 61  Temp(Src) 97.6 F (36.4 C) (Oral)  Resp 21  SpO2 100% Physical Exam GEN: NAD HEENT: Atraumatic, normocephalic, neck supple, EOMI, sclera clear, PERRL  CV: RRR, II/VI systolic murmur heard best LUSB, 1+ bilateral PT pulses PULM: CTAB, normal effort ABD: Soft, nontender,  nondistended, NABS, no organomegaly SKIN: Dry with scattered SKs on back and hyperpigmented thickened skin on legs bilaterally; warm and well-perfused EXTR: No lower extremity edema or calf tenderness PSYCH: Mood and affect euthymic, normal rate and volume of speech NEURO: Awake, alert, no focal deficits grossly, normal speech, oriented x 3, able to stand and walk unassisted with a shuffling gait  ED Course  Procedures (including critical care time) Labs Review Labs Reviewed  CBC WITH DIFFERENTIAL - Abnormal; Notable for the following:    MCV 101.3 (*)    Neutrophils Relative % 78 (*)    All other components within normal limits  BASIC METABOLIC PANEL - Abnormal; Notable for the following:    Glucose, Bld 127 (*)    BUN 25 (*)    Creatinine, Ser 1.23 (*)    GFR calc non Af Amer 37 (*)    GFR calc Af Amer 43 (*)    All other components within normal limits  TROPONIN I  TSH  URINALYSIS, ROUTINE W REFLEX MICROSCOPIC    Imaging Review Dg Chest 2 View  05/24/2014   CLINICAL DATA:  Weakness and dizziness.  Hypotension.  EXAM: CHEST  2 VIEW  COMPARISON:  Two-view chest 12/31/2013  FINDINGS: The heart is enlarged. There is no edema or effusion to suggest failure. Emphysematous changes are again noted. No focal arch that minimal atelectasis is present at the left base. No other significant airspace disease is present. Biapical pleural parenchymal scarring is noted. Degenerative changes are present at the shoulder joints bilaterally. Loose bodies are evident on the left.   IMPRESSION: 1. Cardiomegaly without failure. 2. Emphysema. 3. Minimal atelectasis at the left base.   Electronically Signed   By: Lawrence Santiago M.D.   On: 05/24/2014 10:40     EKG Interpretation   Date/Time:  Tuesday May 24 2014 09:22:00 EDT Ventricular Rate:  70 PR Interval:  196 QRS Duration: 128 QT Interval:  451 QTC Calculation: 487 R Axis:   -2 Text Interpretation:  Sinus rhythm Left bundle branch block No significant  change since last tracing from 01/2014 Confirmed by [name] Gentles  MD, DONALD  [phone]) on 05/24/2014 9:30:44 AM      MDM   Final diagnoses:  Tachycardia  Hypotension    80 y.o. female with generalized weakness and palpitations this morning that came on suddenly with no apparent trigger other than missing breakfast. No chest pain or syncope. Hypotension to SBP 60s and tachycardia to 140s on EMS arrival to ALF, improved to SBP 90s after 500cc NS bolus and pulse improved 10 min later to 70s on arrival to ED. In ED, vitals stable and pt well-appearing with no further palpitations. Consider SVT vs sinus tachycardia from mild dehydration in elderly pt vs tachy-brady from h/o SVT taking beta blocker and CCB. Will also eval for infection or cardiovascular compromise in elderly pt with h/o NSTEMI and SVT. - CBC and BMET unremarkable other than for Cr 1.2(baseline). - EKG - sinus tachycardia with flipped T in lead I that is unchanged from previous. - Troponin negative. - orthostaticsn negative - IV fluids 500cc - TSH pending - UA with reflex microscopy pending. - CXR with minimal left basilar atelectasis. - Old echo with murmur - not new per cards note - Had planned to admit to obs/tele and cards eval for holter monitor, but patient strongly preferred going back to [facility]. Given stable vitals throughout ED visit and ambulation without issues/pain, it was appropriate for patient to be  d/c'ed back to [facility] and f/u with cardiology in 1 week.  Discussed risks of going home (persistent SVT, fall, syncope) and patient voiced understanding. She plans to make [facility] aware of her DNR/DNI status. - Pt to hold lotrel until seen by cards.  Hilton Sinclair, MD PGY-2, [place], MD 05/24/14 [phone]

## 2014-05-24 NOTE — ED Notes (Addendum)
Pt reports to the ED for eval of sudden of generalized weakness and dizziness upon standing to go to the bathroom. Pt had 81 mg of ASA PTA. Pt reports she has a hx of an MI and did not have pain with her previous MI. She reports she could feel her heart pounding. CBG 147 mg/dl. Pt denies any CP, SOB, or N/V. Upon EMS arrival pts HR was 140s and BP was 60/p. Per EMS pt was 140s the entire time she was en route and upon arrival she converted to 70s. Pt received 500 mls of fluid PTA. 12 lead en route showed some elevation. 12 lead obtained at this time and showed to EDP. No new changes noted compared to previous EKG. Pt slightly lethargic at this time. Pt A&Ox4, resp e/u, and skin warm and dry.

## 2014-05-24 NOTE — ED Provider Notes (Signed)
Patient seen/examined in the Emergency Department in conjunction with Resident Physician Provider  Patient reports weakness/dizziness now improved. She reports palpitations Exam : awake/alert, no arm/leg drift, no facial droop Plan: labs/imaging ordered.  Pt iwith h/o SVT, was tachycardic for EMS now in sinus rhythm with improved heart rate    EKG Interpretation  Date/Time:  Tuesday May 24 2014 09:22:00 EDT Ventricular Rate:  70 PR Interval:  196 QRS Duration: 128 QT Interval:  451 QTC Calculation: 487 R Axis:   -2 Text Interpretation:  Sinus rhythm Left bundle branch block No significant change since last tracing from 01/2014 Confirmed by [name] Gentles  MD, Bexley Mclester ([id number]) on 05/24/2014 9:30:44 AM          Sharyon Cable, MD 05/24/14 1006

## 2014-05-24 NOTE — ED Provider Notes (Signed)
I have personally seen and examined the patient.  I have discussed the plan of care with the resident.  I have reviewed the documentation on PMH/FH/Soc. History.  I have reviewed the documentation of the resident and agree.   Sharyon Cable, MD 05/24/14 [phone]

## 2014-05-25 ENCOUNTER — Encounter: Payer: Self-pay | Admitting: Geriatric Medicine

## 2014-05-25 ENCOUNTER — Non-Acute Institutional Stay: Payer: Medicare Other | Admitting: Geriatric Medicine

## 2014-05-25 VITALS — BP 130/74 | HR 80 | Wt 139.0 lb

## 2014-05-25 DIAGNOSIS — Z7189 Other specified counseling: Secondary | ICD-10-CM | POA: Insufficient documentation

## 2014-05-25 DIAGNOSIS — Z299 Encounter for prophylactic measures, unspecified: Secondary | ICD-10-CM

## 2014-05-25 DIAGNOSIS — I959 Hypotension, unspecified: Secondary | ICD-10-CM

## 2014-05-25 DIAGNOSIS — I2510 Atherosclerotic heart disease of native coronary artery without angina pectoris: Secondary | ICD-10-CM

## 2014-05-25 NOTE — Assessment & Plan Note (Signed)
Episode of hypotension, palpitations and weakness yesterday resolved in the emergency department. Recommendation was to stop Lotrel. Patient's blood pressure is satisfactory today 130/74. Recommend b.i.d. blood pressure monitoring until her cardiology appointment June 2.

## 2014-05-25 NOTE — Assessment & Plan Note (Signed)
Extensive discussion this afternoon with the patient regarding her wishes if her health should change. CPR indications, risks and benefits were reviewed. Patient clearly states she would not want to be resuscitated if she were not going to be recovered to her full potential. She understands that signing DO NOT RESUSCITATE means if her heart stops CPR will not be initiated.  We also reviewed the medical orders for scope of treatment form. We had extensive discussion about hospitalization; when she would go and what she would like done for her once hospitalized. She would like to avoid hospitalization if at all possible. This patient would like treatment for acute injuries or illnesses from which she would be able to recover. She would except ICU admission for the purposes of helping to recover to her prior level of functioning. She would not like to be kept "hooked up to machines" just to stay alive, she would not want aggressive treatments just to be kept alive. Quality of life and  Functional status are most important to her.  Patient would like treatment with antibiotics for infections, will except IV fluids for defined period. Patient also except a feeding tube for a short period of time with the understanding that her swallowing function would return and she would be able the period she would not like a feeding tube if as expected her swallowing function would not return. The most form was signed by me and the patient, her daughter [name] supplied her information as the first contact. The original DO NOT RESUSCITATE and most forms will be kept in the patient's home. She and her family have been instructed to bring them with her if she should go to the hospital.

## 2014-05-25 NOTE — Progress Notes (Signed)
[name], female   DOB: 06-06-1920, 80 y.o.   MRN: [id number]   [facility] [phone] Information   Name Relation Home Work Mobile   Lisa & [name] [phone]   [name],Lisa Daughter [phone]  [phone]   [name],Lisa Son [phone]   [name] Daughter [phone]        Chief Complaint  Patient presents with  . Advance directive    to discuss about the forms, here with daughter [name], and daughter-in-law [name]    HPI: This is a 80 y.o. female resident of [place],  [facility]  section.  Evaluation is requested today in followup of ED visit yesterday. Was sent to the hospital due to weakness, hypotension and heart palpitations. Workup did not reveal any specific problem. Hospital admission for observation was recommended, patient declined. Lotrel was discontinued, followup with cardiology was recommended. Patient has expressed clearly she does not want hospitalization or aggressive interventions.    No Known Allergies  MEDICATIONS -     Medication List       This list is accurate as of: 05/25/14  4:19 PM.  Always use your most recent med list.               amlodipine-benazepril 2.5-10 MG per capsule  Commonly known as:  LOTREL  Take 1 capsule by mouth daily.     aspirin EC 81 MG tablet  Take 81 mg by mouth every evening.     atorvastatin 20 MG tablet  Commonly known as:  LIPITOR  Take 20 mg by mouth daily.     brimonidine 0.2 % ophthalmic solution  Commonly known as:  ALPHAGAN  Place 1 drop into both eyes 2 (two) times daily.     diphenhydrAMINE 25 MG tablet  Commonly known as:  SOMINEX  Take 25 mg by mouth at bedtime.     ezetimibe 10 MG tablet  Commonly known as:  ZETIA  Take 10 mg by mouth daily.     imipramine 25 MG tablet  Commonly known as:  TOFRANIL  Take 50 mg by mouth at bedtime.     isosorbide mononitrate 60 MG 24 hr tablet  Commonly known as:  IMDUR  Take 60 mg by mouth daily.     latanoprost 0.005 % ophthalmic solution  Commonly known as:  XALATAN  Place 1 drop into both eyes every evening. UAD     levothyroxine 50 MCG tablet  Commonly known as:  SYNTHROID, LEVOTHROID  Take 50 mcg by mouth daily before breakfast.     metoprolol tartrate 25 MG tablet  Commonly known as:  LOPRESSOR  Take 1 tablet (25 mg total) by mouth 2 (two) times daily.     multivitamin with minerals Tabs tablet  Take 1 tablet by mouth daily. Certavite-A     omeprazole 40 MG capsule  Commonly known as:  PRILOSEC  Take 40 mg by mouth daily.         DATA REVIEWED  Radiologic Exams:   Laboratory Studies: Lab Results  Component Value Date   WBC 7.6 05/24/2014   HGB 12.8 05/24/2014   HCT 39.9 05/24/2014   MCV 101.3* 05/24/2014   PLT 180 05/24/2014   Lab Results  Component Value Date   NA 141 05/24/2014   K 4.5 05/24/2014   GLU 89 03/24/2014   BUN 25* 05/24/2014   CREATININE 1.23* 05/24/2014    REVIEW  OF SYSTEMS  DATA OBTAINED: from patient GENERAL: Feels well  No recent fever, fatigue, change in activity status, appetite, or weight  RESPIRATORY: No cough, wheezing, SOB CARDIAC: No chest pain, palpitations. No edema GI: No abdominal pain  No Nausea,vomiting,diarrhea or constipation  No heartburn or reflux  MUSCULOSKELETAL: No joint pain, swelling or stiffness   No back pain    No muscle ache, pain, weakness    Gait is unsteady, uses cane - doesn't like to bother with walker. Frequent falls  NEUROLOGIC: No dizziness, fainting, headache No change in mental status  PSYCHIATRIC: No feelings of anxiety, depression  Sleeps well     PHYSICAL EXAM Filed Vitals:   05/25/14 1609  BP: 130/74  Pulse: 80  Weight: 139 lb (63.05 kg)   Body mass index is 22.45 kg/(m^2).  GENERAL APPEARANCE: No acute distress, appropriately groomed, normal body habitus Alert, pleasant, conversant. SKIN: No  diaphoresis, rash HEAD: Normocephalic, atraumatic EYES: Conjunctiva/lids clear  RESPIRATORY: Breathing is even, unlabored  Lung sounds are clear and full  CARDIOVASCULAR: Heart RRR   No murmur or extra heart sounds   EDEMA: No peripheral edema   MUSCULOSKELETAL.  Gait is unsteady,not using any assistive device PSYCHIATRIC: Mood and affect appropriate to situation,   ASSESSMENT/PLAN  Hypotension, unspecified Episode of hypotension, palpitations and weakness yesterday resolved in the emergency department. Recommendation was to stop Lotrel. Patient's blood pressure is satisfactory today 130/74. Recommend b.i.d. blood pressure monitoring until her cardiology appointment June 2.  Advanced directives, counseling/discussion Extensive discussion this afternoon with the patient regarding her wishes if her health should change. CPR indications, risks and benefits were reviewed. Patient clearly states she would not want to be resuscitated if she were not going to be recovered to her full potential. She understands that signing DO NOT RESUSCITATE means if her heart stops CPR will not be initiated.  We also reviewed the medical orders for scope of treatment form. We had extensive discussion about hospitalization; when she would go and what she would like done for her once hospitalized. She would like to avoid hospitalization if at all possible. This patient would like treatment for acute injuries or illnesses from which she would be able to recover. She would except ICU admission for the purposes of helping to recover to her prior level of functioning. She would not like to be kept "hooked up to machines" just to stay alive, she would not want aggressive treatments just to be kept alive. Quality of life and  Functional status are most important to her.  Patient would like treatment with antibiotics for infections, will except IV fluids for defined period. Patient also except a feeding tube for a short period of  time with the understanding that her swallowing function would return and she would be able the period she would not like a feeding tube if as expected her swallowing function would not return. The most form was signed by me and the patient, her daughter [name] supplied her information as the first contact. The original DO NOT RESUSCITATE and most forms will be kept in the patient's home. She and her family have been instructed to bring them with her if she should go to the hospital.   Time: 40 minutes, >50% spent counseling/or care coordination  Family/ staff Communication:  Daughter [name] and daughter-in-law [name] are both present for this discussion this afternoon   Labs/tests ordered:    Follow up: Return for As scheduled.  Mardene Celeste, NP-C [facility]  [phone]  05/25/2014    

## 2014-05-31 ENCOUNTER — Encounter: Payer: Self-pay | Admitting: Physician Assistant

## 2014-05-31 ENCOUNTER — Ambulatory Visit (INDEPENDENT_AMBULATORY_CARE_PROVIDER_SITE_OTHER): Payer: Medicare Other | Admitting: Physician Assistant

## 2014-05-31 VITALS — BP 100/50 | HR 51 | Ht 66.0 in | Wt 138.0 lb

## 2014-05-31 DIAGNOSIS — I2510 Atherosclerotic heart disease of native coronary artery without angina pectoris: Secondary | ICD-10-CM

## 2014-05-31 DIAGNOSIS — I471 Supraventricular tachycardia, unspecified: Secondary | ICD-10-CM

## 2014-05-31 DIAGNOSIS — R42 Dizziness and giddiness: Secondary | ICD-10-CM

## 2014-05-31 DIAGNOSIS — I959 Hypotension, unspecified: Secondary | ICD-10-CM

## 2014-05-31 DIAGNOSIS — I10 Essential (primary) hypertension: Secondary | ICD-10-CM

## 2014-05-31 MED ORDER — HYDRALAZINE HCL 10 MG PO TABS: 10.0000 mg | ORAL_TABLET | Freq: Every day | ORAL | Status: DC

## 2014-05-31 NOTE — Progress Notes (Signed)
Date:  05/31/2014   ID:  [name], DOB [birth year], MRN [id number]  PCP:  Estill Dooms, MD  Primary Cardiologist:  Hochrein    History of Present Illness: [name] is a 80 y.o. female  The patient has a history of supraventricular tachycardia.  She has had several mechanical falls. She thinks that there might have been one episode of syncope. However, she is not at all clear on this.   Carotid Dopplers were ordered and showed mild to moderate bilateral plaque.  Also monitor showed rare atrial ectopy. At that visit following a fall, she had her dose of beta blocker reduced.   The patient presents today with complaints of dizziness which particularly happens in the morning. She was seen at the emergency room recently because of this and her Lotrel was held.  She's been charting her blood pressures and heart rates in the morning seems to run around 347 systolic and the evening blood pressures are in the 140s to 180s range.  She reports a decreased appetite lately and a subsequent loss of weight.  She also reports nocturia and is getting up just about every hour to use the bathroom.  The patient currently denies nausea, vomiting, fever, chest pain, shortness of breath, orthopnea, PND, cough, congestion, abdominal pain, hematochezia, melena, lower extremity edema  Wt Readings from Last 3 Encounters:  05/31/14 138 lb (62.596 kg)  05/25/14 139 lb (63.05 kg)  05/09/14 141 lb 6.4 oz (64.139 kg)     Past Medical History  Diagnosis Date  . SVT (supraventricular tachycardia)     Diagnosed 2010  . HTN (hypertension)   . Dyslipidemia   . CAD (coronary artery disease)     a. NSTEMI 2010 in setting of SVT, with cath with unsuccessful PCI - chronic total occlusion of LAD, R->L collaterals.  . GI bleeding   . Unspecified hypothyroidism   . Unspecified hereditary and idiopathic peripheral neuropathy   . Pneumonia, organism unspecified   . Basal cell carcinoma of skin of  trunk, except scrotum   . Unspecified malignant neoplasm of skin of lower limb, including hip   . Basal cell carcinoma of skin of lower limb, including hip   . Unspecified glaucoma     left eye  . Other premature beats   . Other esophagitis   . Esophageal reflux   . Osteoarthrosis, unspecified whether generalized or localized, unspecified site     multiple joints  . Pain in joint, shoulder region   . Pain in joint, lower leg     knee  . Pain in joint, ankle and foot     right  . Sacroiliitis, not elsewhere classified   . Cervicalgia   . Lumbago   . Ganglion of tendon sheath   . Pathologic fracture of vertebrae   . Tietze's disease   . Insomnia, unspecified   . Disturbance of skin sensation   . Edema   . Palpitations   . Other nonspecific abnormal serum enzyme levels   . Nasal bones, closed fracture   . Closed fracture of unspecified trochanteric section of femur   . Open wound of knee, leg (except thigh), and ankle, without mention of complication   . Hip, thigh, leg, and ankle, abrasion or friction burn, without mention of infection   . Contusion of hip   . Fall from other slipping, tripping, or stumbling   . Personal history of fall   . Dyspepsia and other specified disorders of  function of stomach   . Paroxysmal supraventricular tachycardia   . Acute bronchiolitis due to other infectious organisms 03/02/2013  . Other abnormal blood chemistry 03/15/2013  . Contusion of rib on left side 08/03/2013    Current Outpatient Prescriptions  Medication Sig Dispense Refill  . aspirin EC 81 MG tablet Take 81 mg by mouth every evening.      Marland Kitchen atorvastatin (LIPITOR) 20 MG tablet Take 20 mg by mouth daily.      . brimonidine (ALPHAGAN) 0.2 % ophthalmic solution Place 1 drop into both eyes 2 (two) times daily.      . diphenhydrAMINE (SOMINEX) 25 MG tablet Take 25 mg by mouth at bedtime.      Marland Kitchen ezetimibe (ZETIA) 10 MG tablet Take 10 mg by mouth daily.      Marland Kitchen imipramine (TOFRANIL) 25 MG  tablet Take 50 mg by mouth at bedtime.       . isosorbide mononitrate (IMDUR) 60 MG 24 hr tablet Take 60 mg by mouth daily.      Marland Kitchen latanoprost (XALATAN) 0.005 % ophthalmic solution Place 1 drop into both eyes every evening. UAD      . levothyroxine (SYNTHROID, LEVOTHROID) 50 MCG tablet Take 50 mcg by mouth daily before breakfast.      . metoprolol tartrate (LOPRESSOR) 25 MG tablet Take 1 tablet (25 mg total) by mouth 2 (two) times daily.  30 tablet  6  . Multiple Vitamin (MULTIVITAMIN WITH MINERALS) TABS Take 1 tablet by mouth daily. Certavite-A      . omeprazole (PRILOSEC) 40 MG capsule Take 40 mg by mouth daily.      . hydrALAZINE (APRESOLINE) 10 MG tablet Take 1 tablet (10 mg total) by mouth daily at 3 pm.  30 tablet  5   No current facility-administered medications for this visit.    Allergies:   No Known Allergies  Social History:  The patient  reports that she has never smoked. She has never used smokeless tobacco. She reports that she drinks alcohol. She reports that she does not use illicit drugs.   Family history:   Family History  Problem Relation Age of Onset  . Stroke Neg Hx   . Heart attack Neg Hx   . Heart disease Father   . Cancer Brother     lung    ROS:  Please see the history of present illness.  All other systems reviewed and negative.   PHYSICAL EXAM: VS:  BP 100/50  Pulse 51  Ht 5\' 6"  (1.676 m)  Wt 138 lb (62.596 kg)  BMI 22.28 kg/m2 Well nourished, well developed, in no acute distress HEENT: Pupils are equal round react to light accommodation extraocular movements are intact.  Cardiac: Regular rate and rhythm without murmurs rubs or gallops. Lungs:  clear to auscultation bilaterally, no wheezing, rhonchi or rales Ext: no lower extremity edema.  2+ radial and dorsalis 1+ pedis pulses. Skin: warm and dry Neuro:  Grossly normal  EKG:  Sinus bradycardia rate 51 beats per minute left bundle branch block    ASSESSMENT AND PLAN:  Problem List Items  Addressed This Visit   HYPERTENSION     Patient is awaking up in the morning and feeling dizzy at times. She is also reporting decreased appetite and weight. Her blood pressure in the mornings is around 100/50 which is what it is here this morning. Her heart rate appears to be in the 50s based on overturn her Doppler pressures. Her evening BPs however  are ranging 838-184 systolic.  She was seen in the ER recently for this and was told not to take her Lotrel.  Going to discontinue this medicine altogether and start her on hydralazine 10 mg 4 PM and see if this level to her blood pressures throughout the day.  We may need to further titrate the hydralazine in the evening.     Relevant Medications      hydrALAZINE (APRESOLINE) tablet   PSVT     Sinus bradycardia with LBBB    Relevant Medications      hydrALAZINE (APRESOLINE) tablet   Hypotension, unspecified     See above    Relevant Medications      hydrALAZINE (APRESOLINE) tablet   Dizzy     May be related to hypotension in the morning. Patient reports nocturia and is getting up just about every hour.     Other Visit Diagnoses   CAD (coronary artery disease)    -  Primary    Relevant Medications       hydrALAZINE (APRESOLINE) tablet    Other Relevant Orders       EKG 12-Lead

## 2014-05-31 NOTE — Assessment & Plan Note (Signed)
Patient is awaking up in the morning and feeling dizzy at times. She is also reporting decreased appetite and weight. Her blood pressure in the mornings is around 100/50 which is what it is here this morning. Her heart rate appears to be in the 50s based on overturn her Doppler pressures. Her evening BPs however are ranging 224-825 systolic.  She was seen in the ER recently for this and was told not to take her Lotrel.  Going to discontinue this medicine altogether and start her on hydralazine 10 mg 4 PM and see if this level to her blood pressures throughout the day.  We may need to further titrate the hydralazine in the evening.

## 2014-05-31 NOTE — Assessment & Plan Note (Signed)
May be related to hypotension in the morning. Patient reports nocturia and is getting up just about every hour.

## 2014-05-31 NOTE — Patient Instructions (Signed)
1.  Stop lotrel 2.  Start hydralazine 10mg  daily at 4 PM.  Continue to monitor BP twice daily.  Report any concerns. 3.  Follow up with Dr. Percival Spanish

## 2014-05-31 NOTE — Assessment & Plan Note (Signed)
See above

## 2014-05-31 NOTE — Assessment & Plan Note (Signed)
Sinus bradycardia with LBBB

## 2014-06-20 ENCOUNTER — Other Ambulatory Visit: Payer: Self-pay | Admitting: Internal Medicine

## 2014-06-28 ENCOUNTER — Other Ambulatory Visit: Payer: Self-pay | Admitting: *Deleted

## 2014-06-28 MED ORDER — METOPROLOL TARTRATE 25 MG PO TABS: 25.0000 mg | ORAL_TABLET | Freq: Two times a day (BID) | ORAL | Status: DC

## 2014-07-12 ENCOUNTER — Other Ambulatory Visit: Payer: Self-pay | Admitting: Internal Medicine

## 2014-07-14 ENCOUNTER — Emergency Department (HOSPITAL_COMMUNITY)
Admission: EM | Admit: 2014-07-14 | Discharge: 2014-07-14 | Discharge status: Against Medical Advice | Payer: Medicare Other | Attending: Emergency Medicine | Admitting: Emergency Medicine

## 2014-07-14 ENCOUNTER — Emergency Department (HOSPITAL_COMMUNITY): Payer: Medicare Other

## 2014-07-14 ENCOUNTER — Telehealth: Payer: Self-pay

## 2014-07-14 ENCOUNTER — Encounter (HOSPITAL_COMMUNITY): Payer: Self-pay | Admitting: Emergency Medicine

## 2014-07-14 DIAGNOSIS — Z872 Personal history of diseases of the skin and subcutaneous tissue: Secondary | ICD-10-CM | POA: Diagnosis not present

## 2014-07-14 DIAGNOSIS — Z8709 Personal history of other diseases of the respiratory system: Secondary | ICD-10-CM | POA: Diagnosis not present

## 2014-07-14 DIAGNOSIS — Z85828 Personal history of other malignant neoplasm of skin: Secondary | ICD-10-CM | POA: Diagnosis not present

## 2014-07-14 DIAGNOSIS — Z79899 Other long term (current) drug therapy: Secondary | ICD-10-CM | POA: Insufficient documentation

## 2014-07-14 DIAGNOSIS — R55 Syncope and collapse: Secondary | ICD-10-CM | POA: Diagnosis not present

## 2014-07-14 DIAGNOSIS — I959 Hypotension, unspecified: Secondary | ICD-10-CM | POA: Diagnosis present

## 2014-07-14 DIAGNOSIS — I498 Other specified cardiac arrhythmias: Secondary | ICD-10-CM | POA: Insufficient documentation

## 2014-07-14 DIAGNOSIS — Z8719 Personal history of other diseases of the digestive system: Secondary | ICD-10-CM | POA: Diagnosis not present

## 2014-07-14 DIAGNOSIS — I2510 Atherosclerotic heart disease of native coronary artery without angina pectoris: Secondary | ICD-10-CM | POA: Insufficient documentation

## 2014-07-14 DIAGNOSIS — Z7982 Long term (current) use of aspirin: Secondary | ICD-10-CM | POA: Insufficient documentation

## 2014-07-14 DIAGNOSIS — H409 Unspecified glaucoma: Secondary | ICD-10-CM | POA: Diagnosis not present

## 2014-07-14 DIAGNOSIS — E785 Hyperlipidemia, unspecified: Secondary | ICD-10-CM | POA: Insufficient documentation

## 2014-07-14 DIAGNOSIS — Z9181 History of falling: Secondary | ICD-10-CM | POA: Diagnosis not present

## 2014-07-14 DIAGNOSIS — Z8739 Personal history of other diseases of the musculoskeletal system and connective tissue: Secondary | ICD-10-CM | POA: Diagnosis not present

## 2014-07-14 DIAGNOSIS — I471 Supraventricular tachycardia: Secondary | ICD-10-CM

## 2014-07-14 DIAGNOSIS — I10 Essential (primary) hypertension: Secondary | ICD-10-CM | POA: Insufficient documentation

## 2014-07-14 LAB — BASIC METABOLIC PANEL
ANION GAP: 13 (ref 5–15)
BUN: 32 mg/dL — ABNORMAL HIGH (ref 6–23)
CO2: 24 meq/L (ref 19–32)
Calcium: 9.4 mg/dL (ref 8.4–10.5)
Chloride: 99 mEq/L (ref 96–112)
Creatinine, Ser: 1.19 mg/dL — ABNORMAL HIGH (ref 0.50–1.10)
GFR calc Af Amer: 45 mL/min — ABNORMAL LOW (ref >90)
GFR calc non Af Amer: 38 mL/min — ABNORMAL LOW (ref >90)
Glucose, Bld: 131 mg/dL — ABNORMAL HIGH (ref 70–99)
POTASSIUM: 5 meq/L (ref 3.7–5.3)
SODIUM: 136 meq/L — AB (ref 137–147)

## 2014-07-14 LAB — URINALYSIS, ROUTINE W REFLEX MICROSCOPIC
Bilirubin Urine: NEGATIVE
GLUCOSE, UA: NEGATIVE mg/dL
Hgb urine dipstick: NEGATIVE
Ketones, ur: NEGATIVE mg/dL
LEUKOCYTES UA: NEGATIVE
Nitrite: NEGATIVE
Protein, ur: NEGATIVE mg/dL
SPECIFIC GRAVITY, URINE: 1.017 (ref 1.005–1.030)
Urobilinogen, UA: 0.2 mg/dL (ref 0.0–1.0)
pH: 6 (ref 5.0–8.0)

## 2014-07-14 LAB — CBC
HEMATOCRIT: 42.9 % (ref 36.0–46.0)
HEMOGLOBIN: 14.2 g/dL (ref 12.0–15.0)
MCH: 33.3 pg (ref 26.0–34.0)
MCHC: 33.1 g/dL (ref 30.0–36.0)
MCV: 100.7 fL — ABNORMAL HIGH (ref 78.0–100.0)
Platelets: 212 10*3/uL (ref 150–400)
RBC: 4.26 MIL/uL (ref 3.87–5.11)
RDW: 13.4 % (ref 11.5–15.5)
WBC: 9.8 10*3/uL (ref 4.0–10.5)

## 2014-07-14 NOTE — ED Notes (Signed)
Dr. Wyvonnia Dusky given ekg and in room to evaluate pt.

## 2014-07-14 NOTE — ED Notes (Signed)
Pt's HR increased. Denies dizziness.

## 2014-07-14 NOTE — ED Notes (Signed)
Assisted Lovena Le, [place] with in and out cath

## 2014-07-14 NOTE — ED Notes (Signed)
Patient transported to X-ray 

## 2014-07-14 NOTE — Telephone Encounter (Signed)
New message  ° ° °Returning call back to nurse.  °

## 2014-07-14 NOTE — Telephone Encounter (Signed)
Returned call to Mliss Sax she stated daughter is taking patient to [facility] ER.

## 2014-07-14 NOTE — ED Provider Notes (Signed)
CSN: [id number]     Arrival date & time 07/14/14  1439 History   First MD Initiated Contact with Patient 07/14/14 1543     Chief Complaint  Patient presents with  . Seizures  . Hypotension   (Consider location/radiation/quality/duration/timing/severity/associated sxs/prior Treatment) Patient is a 80 y.o. female presenting with palpitations.  Palpitations Palpitations quality:  Fast Onset quality:  Sudden Timing:  Constant Context: not anxiety   Relieved by:  Nothing Associated symptoms: syncope   Associated symptoms: no back pain, no chest pain, no cough, no nausea, no numbness, no shortness of breath and no vomiting     Past Medical History  Diagnosis Date  . SVT (supraventricular tachycardia)     Diagnosed 2010  . HTN (hypertension)   . Dyslipidemia   . CAD (coronary artery disease)     a. NSTEMI 2010 in setting of SVT, with cath with unsuccessful PCI - chronic total occlusion of LAD, R->L collaterals.  . GI bleeding   . Unspecified hypothyroidism   . Unspecified hereditary and idiopathic peripheral neuropathy   . Pneumonia, organism unspecified   . Basal cell carcinoma of skin of trunk, except scrotum   . Unspecified malignant neoplasm of skin of lower limb, including hip   . Basal cell carcinoma of skin of lower limb, including hip   . Unspecified glaucoma     left eye  . Other premature beats   . Other esophagitis   . Esophageal reflux   . Osteoarthrosis, unspecified whether generalized or localized, unspecified site     multiple joints  . Pain in joint, shoulder region   . Pain in joint, lower leg     knee  . Pain in joint, ankle and foot     right  . Sacroiliitis, not elsewhere classified   . Cervicalgia   . Lumbago   . Ganglion of tendon sheath   . Pathologic fracture of vertebrae   . Tietze's disease   . Insomnia, unspecified   . Disturbance of skin sensation   . Edema   . Palpitations   . Other nonspecific abnormal serum enzyme levels   . Nasal  bones, closed fracture   . Closed fracture of unspecified trochanteric section of femur   . Open wound of knee, leg (except thigh), and ankle, without mention of complication   . Hip, thigh, leg, and ankle, abrasion or friction burn, without mention of infection   . Contusion of hip   . Fall from other slipping, tripping, or stumbling   . Personal history of fall   . Dyspepsia and other specified disorders of function of stomach   . Paroxysmal supraventricular tachycardia   . Acute bronchiolitis due to other infectious organisms 03/02/2013  . Other abnormal blood chemistry 03/15/2013  . Contusion of rib on left side 08/03/2013   Past Surgical History  Procedure Laterality Date  . Hip surgery  2001  . Breast lumpectomy  1995    right breast, negative for malignancy  . Cataract extraction  2003    bilateral  . Leg skin lesion  biopsy / excision Left 08/22/2010    Lower leg shave biopsy -solar keratosis (Dr. Radford Pax)   Family History  Problem Relation Age of Onset  . Stroke Neg Hx   . Heart attack Neg Hx   . Heart disease Father   . Cancer Brother     lung   History  Substance Use Topics  . Smoking status: Never Smoker   . Smokeless tobacco: Never  Used     Comment: denies smoking cigarettes  . Alcohol Use: Yes     Comment: drinks a scotch or wine every other day    OB History   Grav Para Term Preterm Abortions TAB SAB Ect Mult Living                 Review of Systems  Constitutional: Negative for fever and chills.  HENT: Negative for sore throat.   Eyes: Negative for pain.  Respiratory: Negative for cough and shortness of breath.   Cardiovascular: Positive for palpitations and syncope. Negative for chest pain.  Gastrointestinal: Negative for nausea, vomiting, abdominal pain and diarrhea.  Genitourinary: Negative for dysuria.  Musculoskeletal: Negative for back pain.  Skin: Negative for rash.  Neurological: Negative for numbness and headaches.      Allergies   Review of patient's allergies indicates no known allergies.  Home Medications   Prior to Admission medications   Medication Sig Start Date End Date Taking? Authorizing Provider  aspirin EC 81 MG tablet Take 81 mg by mouth every evening.    Historical Provider, MD  atorvastatin (LIPITOR) 20 MG tablet Take 20 mg by mouth daily.    Historical Provider, MD  brimonidine (ALPHAGAN) 0.2 % ophthalmic solution Place 1 drop into both eyes 2 (two) times daily.    Historical Provider, MD  diphenhydrAMINE (SOMINEX) 25 MG tablet Take 25 mg by mouth at bedtime.    Historical Provider, MD  ezetimibe (ZETIA) 10 MG tablet Take 10 mg by mouth daily.    Historical Provider, MD  hydrALAZINE (APRESOLINE) 10 MG tablet Take 1 tablet (10 mg total) by mouth daily at 3 pm. 05/31/14   Tarri Fuller, PA-C  imipramine (TOFRANIL) 25 MG tablet Take 50 mg by mouth at bedtime.  03/21/14   Historical Provider, MD  imipramine (TOFRANIL) 25 MG tablet TAKE 2 TABLETS BY MOUTH DAILY AT BEDTIME TO REDUCE URINARY FREQUENCY. 07/12/14   Estill Dooms, MD  isosorbide mononitrate (IMDUR) 60 MG 24 hr tablet Take 60 mg by mouth daily.    Historical Provider, MD  latanoprost (XALATAN) 0.005 % ophthalmic solution Place 1 drop into both eyes every evening. UAD    Historical Provider, MD  levothyroxine (SYNTHROID, LEVOTHROID) 50 MCG tablet Take 50 mcg by mouth daily before breakfast.    Historical Provider, MD  metoprolol tartrate (LOPRESSOR) 25 MG tablet Take 1 tablet (25 mg total) by mouth 2 (two) times daily. 06/28/14   Minus Breeding, MD  Multiple Vitamin (MULTIVITAMIN WITH MINERALS) TABS Take 1 tablet by mouth daily. Certavite-A    Historical Provider, MD  omeprazole (PRILOSEC) 40 MG capsule Take 40 mg by mouth daily.    Historical Provider, MD  ZETIA 10 MG tablet TAKE 1 TABLET BY MOUTH DAILY TO CONTROL LIPIDS 06/20/14   Tiffany L Reed, DO   BP 169/87  Pulse 60  Temp(Src) 99.8 F (37.7 C) (Rectal)  Resp 12  SpO2 99% Physical Exam   Constitutional: She is oriented to person, place, and time. She appears well-developed and well-nourished. No distress.  HENT:  Head: Normocephalic and atraumatic.  Eyes: Pupils are equal, round, and reactive to light. Right eye exhibits no discharge. Left eye exhibits no discharge.  Neck: Normal range of motion.  Cardiovascular: Regular rhythm, S1 normal, S2 normal and normal heart sounds.  Tachycardia present.   Pulmonary/Chest: Effort normal and breath sounds normal.  Abdominal: Soft. She exhibits no distension. There is no tenderness.  Musculoskeletal: Normal range of motion.  Neurological: She is alert and oriented to person, place, and time.  Skin: Skin is warm. She is not diaphoretic.    ED Course  Procedures (including critical care time) Labs Review Labs Reviewed  BASIC METABOLIC PANEL - Abnormal; Notable for the following:    Sodium 136 (*)    Glucose, Bld 131 (*)    BUN 32 (*)    Creatinine, Ser 1.19 (*)    GFR calc non Af Amer 38 (*)    GFR calc Af Amer 45 (*)    All other components within normal limits  CBC - Abnormal; Notable for the following:    MCV 100.7 (*)    All other components within normal limits  URINALYSIS, ROUTINE W REFLEX MICROSCOPIC    Imaging Review Dg Chest 2 View  07/14/2014   CLINICAL DATA:  Seizures and hypertension.  EXAM: CHEST  2 VIEW  COMPARISON:  05/24/2014  FINDINGS: Lateral view degraded by patient arm position. Hyperinflation. Moderate thoracic spondylosis. Right glenohumeral joint osteoarthritis. Midline trachea. Mild cardiomegaly with aortic atherosclerosis. No pleural effusion or pneumothorax. No congestive failure. Clear lungs.  IMPRESSION: Cardiomegaly and hyperinflation, without acute disease.   Electronically Signed   By: Abigail Miyamoto M.D.   On: 07/14/2014 19:15     EKG Interpretation   Date/Time:  Thursday July 14 2014 15:14:10 EDT Ventricular Rate:  131 PR Interval:    QRS Duration: 116 QT Interval:  338 QTC Calculation:  499 R Axis:   -71 Text Interpretation:  Supraventricular tachycardia with occasional  Premature ventricular complexes Left axis deviation Left bundle branch  block Septal infarct , age undetermined ST \\T \ T wave abnormality,  consider lateral ischemia Abnormal ECG Confirmed by RAY MD, Andee Poles  [phone]) on 07/14/2014 3:23:27 PM      MDM   Final diagnoses:  Syncope and collapse  Hypotension, unspecified hypotension type  SVT (supraventricular tachycardia)   80 year old female with multiple medical problems as listed above, specifically coronary artery disease, SVT, hypertension, hyperlipidemia, prior GI bleeds, presents today to the emergency room for concern for syncopal episodes and a racing heart rate.  Upon arrival the patient has hypotension with initial blood pressures with systolics in the 10G and 26R. Patient is tachycardic with a heart rate in the 120s to 130s. The patient appears in no acute distress. The patient is not complaining of any chest pain or abdominal pain. The patient denies any shortness of breath. Patient states she is felt mildly fatigued recently. Initial EKG demonstrates SVT with occasional PVCs with a left bundle branch block. Compared to prior EKGs, waveforms are similar.  Aproximally 15 minutes after arrival, the patient reverted to normal sinus rhythm without any acute intervention. Patient states that she feels much better. Patient was reassessed, and looks comfortable. Given the patient's syncope today, is likely related to her SVT. Given the patient's high risk for multiple medical problems, recommendations the patient be admitted for continued observation. The patient states that she does not want to be admitted. She is awake alert and oriented and appears capable of making her decisions. The son is at the bedside. It was relayed to both of them the concerns in the high risk of this type of  patient with syncope and SVT.  The patient again reiterates that she  does not want to come into the hospital. I discussed the risks of liver hospital without admission. These include but are not limited to repeat SVT, worsening hypotension, syncope, dysrhythmia, death. The son and the  patient recognized these risks and still stated they wished to leave. Recommended that the patient followup with her primary care physician and with her cardiologist. Patient discharged from the emergency room in stable condition [place]. Patient was seen and evaluated by myself and by the attending Dr. Jeanell Sparrow.    Freddi Che, MD 07/15/14 [phone]

## 2014-07-14 NOTE — ED Notes (Signed)
Pt was at hair salon today when she had a seizure with "really bad shaking" x 3, at 11am. Pt lives at [facility], had BP checked and was told it was low. Denies hx of seizures, but pt's husband sts he has seen it before. daugther at bedside to report info, pt doesn't remember anything happening. Pt normally remembers things. Hx of syncopal episodes. Nad, skin warm and dry, resp e/u.

## 2014-07-14 NOTE — Telephone Encounter (Signed)
Received call from [facility] at [facility] she stated patient has had 3 seizures and has passed out x 3 this morning.Stated B/P low 80/60.Advised patient needs to go to [facility] ER.Trish called.

## 2014-07-15 NOTE — ED Provider Notes (Signed)
80 y.o. Female presents today with svt.  Patient converted spontaneously in ed.  Patient with advanced age and encouraged to stay for further evaluation but refuses to stay.   EKG Interpretation  Date/Time:  Thursday July 14 2014 15:14:10 EDT Ventricular Rate:  131 PR Interval:    QRS Duration: 116 QT Interval:  338 QTC Calculation: 499 R Axis:   -71 Text Interpretation:  Supraventricular tachycardia with occasional Premature ventricular complexes Left axis deviation Left bundle branch block Septal infarct , age undetermined ST \\T \ T wave abnormality, consider lateral ischemia Abnormal ECG Confirmed by Felcia Huebert MD, Andee Poles [phone]) on 07/14/2014 3:23:27 PM       I performed a history and physical examination of [name] and discussed her management with Dr. Silvio Clayman.  I agree with the history, physical, assessment, and plan of care, with the following exceptions: None  I was present for the following procedures: None Time Spent in Critical Care of the patient: None Time spent in discussions with the patient and family: 5  [name] S    Lisa Pollack, MD 07/15/14 [phone]

## 2014-07-18 ENCOUNTER — Encounter: Payer: Self-pay | Admitting: Internal Medicine

## 2014-07-18 ENCOUNTER — Telehealth: Payer: Self-pay

## 2014-07-18 ENCOUNTER — Non-Acute Institutional Stay: Payer: Medicare Other | Admitting: Internal Medicine

## 2014-07-18 VITALS — BP 100/60 | HR 64 | Wt 138.0 lb

## 2014-07-18 DIAGNOSIS — I471 Supraventricular tachycardia, unspecified: Secondary | ICD-10-CM

## 2014-07-18 DIAGNOSIS — I10 Essential (primary) hypertension: Secondary | ICD-10-CM

## 2014-07-18 DIAGNOSIS — R002 Palpitations: Secondary | ICD-10-CM

## 2014-07-18 DIAGNOSIS — I2510 Atherosclerotic heart disease of native coronary artery without angina pectoris: Secondary | ICD-10-CM

## 2014-07-18 NOTE — Telephone Encounter (Signed)
appt with Rinaldo Ratel NP for Dr. Percival Spanish Wed 08/03/14 at 2:00. Wrote date and time and gave to daughter Dionne Bucy while they were in the office.

## 2014-07-18 NOTE — Progress Notes (Signed)
[name], female   DOB: Feb 02, 1920, 80 y.o.   MRN: [id number]    Location:  [facility] (12)    No Known Allergies  Chief Complaint  Patient presents with  . Loss of Consciousness    went to [facility] ER 07/14/14 -syncopal episodes and racing heart rate. Here with daughter [name]. Patient passed out twice at the hairdressers, then went to ER. Per daughter she was dizzy yesterday and vomited dinner. Today patient feels ok     HPI:  Syncopal episode clearly related to PSVT on 07/14/14.Ekg document ed the PSVT in the ER. Spontaneously resolved in the ER. Does not seem to have reoccurred. This is one of several events involving presyncope, syncope and falls. We are not adequately controlling her arrhythmia with current medication. She is seeing cardiologist, Dr. Percival Spanish. She had a prior Holter.   Dizzy episodes. Abrupt in onset. Not clearly related to arrhythmia.  Episodes of vomiting after eating. Intermittent. Possibly induced by esophageal spasm.  Medications: Patient's Medications  New Prescriptions   No medications on file  Previous Medications   ASPIRIN EC 81 MG TABLET    Take 81 mg by mouth every evening.   ATORVASTATIN (LIPITOR) 20 MG TABLET    Take 20 mg by mouth daily.   BRIMONIDINE (ALPHAGAN) 0.2 % OPHTHALMIC SOLUTION    Place 1 drop into both eyes 2 (two) times daily.   DIPHENHYDRAMINE (SOMINEX) 25 MG TABLET    Take 25 mg by mouth at bedtime.   EZETIMIBE (ZETIA) 10 MG TABLET    Take 10 mg by mouth daily.   HYDRALAZINE (APRESOLINE) 10 MG TABLET    Take 1 tablet (10 mg total) by mouth daily at 3 pm.   IMIPRAMINE (TOFRANIL) 25 MG TABLET    Take 50 mg by mouth at bedtime.    IMIPRAMINE (TOFRANIL) 25 MG TABLET    TAKE 2 TABLETS BY MOUTH DAILY AT BEDTIME TO REDUCE URINARY FREQUENCY.   ISOSORBIDE MONONITRATE (IMDUR) 60 MG 24 HR TABLET    Take 60 mg by mouth daily.   LATANOPROST (XALATAN) 0.005 % OPHTHALMIC SOLUTION     Place 1 drop into both eyes every evening. UAD   LEVOTHYROXINE (SYNTHROID, LEVOTHROID) 50 MCG TABLET    Take 50 mcg by mouth daily before breakfast.   METOPROLOL TARTRATE (LOPRESSOR) 25 MG TABLET    Take 1 tablet (25 mg total) by mouth 2 (two) times daily.   MULTIPLE VITAMIN (MULTIVITAMIN WITH MINERALS) TABS    Take 1 tablet by mouth daily. Certavite-A   OMEPRAZOLE (PRILOSEC) 40 MG CAPSULE    Take 40 mg by mouth daily.   ZETIA 10 MG TABLET    TAKE 1 TABLET BY MOUTH DAILY TO CONTROL LIPIDS  Modified Medications   No medications on file  Discontinued Medications   No medications on file     Review of Systems  Constitutional: Negative for fever, chills, weight loss, malaise/fatigue and diaphoresis.  HENT: Positive for hearing loss. Negative for congestion and sore throat.   Eyes: Negative for blurred vision and double vision.  Respiratory: Negative for cough, sputum production and shortness of breath.   Cardiovascular: Negative for chest pain, palpitations, leg swelling and PND.       PSVT and syncopal episodes.  Gastrointestinal: Negative for heartburn, nausea, diarrhea and constipation.  Genitourinary: Positive for frequency. Negative for dysuria, urgency, hematuria and flank pain.  Musculoskeletal: Negative for back pain, myalgias and neck  pain.  Skin: Negative for itching and rash.       Growth on left triceps  Neurological: Negative for tingling, tremors, sensory change, speech change, focal weakness, seizures, weakness and headaches.  Hematological: Bruises/bleeds easily.  Psychiatric/Behavioral: Positive for memory loss. Negative for depression. The patient does not have insomnia.     Filed Vitals:   07/18/14 1445  BP: 100/60  Pulse: 64  Weight: 138 lb (62.596 kg)  SpO2: 95%   Body mass index is 22.28 kg/(m^2).  Physical Exam  Constitutional: She appears well-developed and well-nourished.  HENT:  Bilateral hearing loss  Eyes: Conjunctivae are normal. Pupils are equal,  round, and reactive to light.  Neck: Normal range of motion. No JVD present. No tracheal deviation present. No thyromegaly present.  Cardiovascular: Normal rate, regular rhythm, normal heart sounds and intact distal pulses.  Exam reveals no gallop and no friction rub.   No murmur heard. Pulmonary/Chest: Effort normal and breath sounds normal. No respiratory distress. She has no wheezes. She has no rales.  Abdominal: Bowel sounds are normal. She exhibits no mass. There is no tenderness. There is no guarding.  Musculoskeletal: Normal range of motion. She exhibits no edema.  Large ganglion of the proximal phalanx of the right fifth digit. Some restriction in full flexion. Unstable gait.  Lymphadenopathy:    She has no cervical adenopathy.  Neurological: She is alert. She has normal reflexes. No cranial nerve deficit. Coordination normal.  Memory loss.  Skin: No rash noted. No erythema.  Discolored skin in legs and arms from multiple injuries, sun damage, and skin cancers.  Psychiatric: She has a normal mood and affect. Her behavior is normal. Judgment and thought content normal.     Labs reviewed: Admission on 07/14/2014, Discharged on 07/14/2014  Component Date Value Ref Range Status  . Sodium 07/14/2014 136* 137 - 147 mEq/L Final  . Potassium 07/14/2014 5.0  3.7 - 5.3 mEq/L Final  . Chloride 07/14/2014 99  96 - 112 mEq/L Final  . CO2 07/14/2014 24  19 - 32 mEq/L Final  . Glucose, Bld 07/14/2014 131* 70 - 99 mg/dL Final  . BUN 07/14/2014 32* 6 - 23 mg/dL Final  . Creatinine, Ser 07/14/2014 1.19* 0.50 - 1.10 mg/dL Final  . Calcium 07/14/2014 9.4  8.4 - 10.5 mg/dL Final  . GFR calc non Af Amer 07/14/2014 38* >90 mL/min Final  . GFR calc Af Amer 07/14/2014 45* >90 mL/min Final   Comment: (NOTE)                          The eGFR has been calculated using the CKD EPI equation.                          This calculation has not been validated in all clinical situations.                           eGFR's persistently <90 mL/min signify possible Chronic Kidney                          Disease.  . Anion gap 07/14/2014 13  5 - 15 Final  . WBC 07/14/2014 9.8  4.0 - 10.5 K/uL Final  . RBC 07/14/2014 4.26  3.87 - 5.11 MIL/uL Final  . Hemoglobin 07/14/2014 14.2  12.0 - 15.0 g/dL Final  .  HCT 07/14/2014 42.9  36.0 - 46.0 % Final  . MCV 07/14/2014 100.7* 78.0 - 100.0 fL Final  . MCH 07/14/2014 33.3  26.0 - 34.0 pg Final  . MCHC 07/14/2014 33.1  30.0 - 36.0 g/dL Final  . RDW 07/14/2014 13.4  11.5 - 15.5 % Final  . Platelets 07/14/2014 212  150 - 400 K/uL Final  . Color, Urine 07/14/2014 YELLOW  YELLOW Final  . APPearance 07/14/2014 CLEAR  CLEAR Final  . Specific Gravity, Urine 07/14/2014 1.017  1.005 - 1.030 Final  . pH 07/14/2014 6.0  5.0 - 8.0 Final  . Glucose, UA 07/14/2014 NEGATIVE  NEGATIVE mg/dL Final  . Hgb urine dipstick 07/14/2014 NEGATIVE  NEGATIVE Final  . Bilirubin Urine 07/14/2014 NEGATIVE  NEGATIVE Final  . Ketones, ur 07/14/2014 NEGATIVE  NEGATIVE mg/dL Final  . Protein, ur 07/14/2014 NEGATIVE  NEGATIVE mg/dL Final  . Urobilinogen, UA 07/14/2014 0.2  0.0 - 1.0 mg/dL Final  . Nitrite 07/14/2014 NEGATIVE  NEGATIVE Final  . Leukocytes, UA 07/14/2014 NEGATIVE  NEGATIVE Final   MICROSCOPIC NOT DONE ON URINES WITH NEGATIVE PROTEIN, BLOOD, LEUKOCYTES, NITRITE, OR GLUCOSE <1000 mg/dL.  Admission on 05/24/2014, Discharged on 05/24/2014  Component Date Value Ref Range Status  . Troponin I 05/24/2014 <0.30  <0.30 ng/mL Final   Comment:                                 Due to the release kinetics of cTnI,                          a negative result within the first hours                          of the onset of symptoms does not rule out                          myocardial infarction with certainty.                          If myocardial infarction is still suspected,                          repeat the test at appropriate intervals.  . WBC 05/24/2014 7.6  4.0 - 10.5 K/uL Final    . RBC 05/24/2014 3.94  3.87 - 5.11 MIL/uL Final  . Hemoglobin 05/24/2014 12.8  12.0 - 15.0 g/dL Final  . HCT 05/24/2014 39.9  36.0 - 46.0 % Final  . MCV 05/24/2014 101.3* 78.0 - 100.0 fL Final  . MCH 05/24/2014 32.5  26.0 - 34.0 pg Final  . MCHC 05/24/2014 32.1  30.0 - 36.0 g/dL Final  . RDW 05/24/2014 13.0  11.5 - 15.5 % Final  . Platelets 05/24/2014 180  150 - 400 K/uL Final  . Neutrophils Relative % 05/24/2014 78* 43 - 77 % Final  . Neutro Abs 05/24/2014 5.9  1.7 - 7.7 K/uL Final  . Lymphocytes Relative 05/24/2014 13  12 - 46 % Final  . Lymphs Abs 05/24/2014 1.0  0.7 - 4.0 K/uL Final  . Monocytes Relative 05/24/2014 7  3 - 12 % Final  . Monocytes Absolute 05/24/2014 0.5  0.1 - 1.0 K/uL Final  . Eosinophils Relative 05/24/2014 2  0 -  5 % Final  . Eosinophils Absolute 05/24/2014 0.2  0.0 - 0.7 K/uL Final  . Basophils Relative 05/24/2014 0  0 - 1 % Final  . Basophils Absolute 05/24/2014 0.0  0.0 - 0.1 K/uL Final  . Sodium 05/24/2014 141  137 - 147 mEq/L Final  . Potassium 05/24/2014 4.5  3.7 - 5.3 mEq/L Final  . Chloride 05/24/2014 105  96 - 112 mEq/L Final  . CO2 05/24/2014 27  19 - 32 mEq/L Final  . Glucose, Bld 05/24/2014 127* 70 - 99 mg/dL Final  . BUN 05/24/2014 25* 6 - 23 mg/dL Final  . Creatinine, Ser 05/24/2014 1.23* 0.50 - 1.10 mg/dL Final  . Calcium 05/24/2014 9.1  8.4 - 10.5 mg/dL Final  . GFR calc non Af Amer 05/24/2014 37* >90 mL/min Final  . GFR calc Af Amer 05/24/2014 43* >90 mL/min Final   Comment: (NOTE)                          The eGFR has been calculated using the CKD EPI equation.                          This calculation has not been validated in all clinical situations.                          eGFR's persistently <90 mL/min signify possible Chronic Kidney                          Disease.  Marland Kitchen TSH 05/24/2014 4.310  0.350 - 4.500 uIU/mL Final   Please note change in reference range.      Assessment/Plan  1. HYPERTENSION Running low. On metoprolol for  rhythm control, but it lowers BP too much. I do not think it is wise to increase metoprolol. Cardiology switched here from Lotrel to Hydralazine last visit. I am not sure she needs the hydralazine, but will leave this to the discretion of cardiology.  2. PSVT - Ambulatory referral to Cardiology. I need advice regarding other antiarrhythmic therapy. Does she need longer monitoring, pacemaker, amiodarone, or other medication.  3. Palpitations Related to PSVT.

## 2014-08-03 ENCOUNTER — Encounter: Payer: Self-pay | Admitting: Cardiology

## 2014-08-03 ENCOUNTER — Ambulatory Visit (INDEPENDENT_AMBULATORY_CARE_PROVIDER_SITE_OTHER): Payer: Medicare Other | Admitting: Cardiology

## 2014-08-03 VITALS — BP 100/52 | HR 56 | Ht 65.0 in | Wt 139.6 lb

## 2014-08-03 DIAGNOSIS — I471 Supraventricular tachycardia, unspecified: Secondary | ICD-10-CM

## 2014-08-03 DIAGNOSIS — I10 Essential (primary) hypertension: Secondary | ICD-10-CM

## 2014-08-03 DIAGNOSIS — R55 Syncope and collapse: Secondary | ICD-10-CM

## 2014-08-03 DIAGNOSIS — I959 Hypotension, unspecified: Secondary | ICD-10-CM

## 2014-08-03 DIAGNOSIS — I498 Other specified cardiac arrhythmias: Secondary | ICD-10-CM

## 2014-08-03 DIAGNOSIS — I2510 Atherosclerotic heart disease of native coronary artery without angina pectoris: Secondary | ICD-10-CM

## 2014-08-03 MED ORDER — METOPROLOL TARTRATE 25 MG PO TABS: 25.0000 mg | ORAL_TABLET | Freq: Three times a day (TID) | ORAL | Status: DC

## 2014-08-03 NOTE — Progress Notes (Signed)
08/03/2014   PCP: Estill Dooms, MD   Chief Complaint  Patient presents with  . Follow-up    Pt was refer by Dr Nyoka Cowden for palpitation, fast heart    Primary Cardiologist:Dr. Vita Barley   HPI:  80 year old female presents today at the request of Dr. Aura Camps for SVT.  She has a history of SVT previously treated both in the emergency room and the office.  She had done well for some time but July 16 she had a syncopal episode at her hairdressers. Originally was in sinus in the ER but then developed SVT.  For her it was 131 in the emergency room.  The patient had no recollection of the syncope.  Enzymes were negative. Since that time the patient and her daughter states she's had recurrent tachycardia and she August also had episodes of labile BP.  Her blood pressures have ranged from 85/57 with a pulse of 129 2 a blood pressure of 153/88 with a pulse of 77 she's also had blood pressure is 95/53 with a pulse of 52.  Her daughter brings several pages of blood pressure recordings done by the caregiver. Patient lives at [place] and caregiver comes twice a day to give medications and check her blood pressure.    She has no complaints of chest pain or particular shortness of breath though in church when she is [place] incl[place] she does feel short of breath than.  In the mornings when she wakes up she is somewhat dizzy and her falls usually occur that time a day. Some of her falls are related to tripping others may be due to hypertension/bradycardia.  In May she wore an event monitor the maintains sinus rhythm with PACs.  Her other history includes cardiac catheterization in 2010 DT chest pain associated with SVT. She had one vessel obstructive coronary disease of the LAD with borderline disease of the left circumflex EF was normal at 60%. Her enzymes did bump on that admission as well. Dr. Denman George attempted PCI but was unsuccessful.  She has been treated  medically.  Wynetta Emery has carotid disease with right internal carotid artery 40-59% stenosis and left internal carotid artery 1-39% stenosis.  Lipid Panel     Component Value Date/Time   CHOL 129 03/24/2014   TRIG 117 03/24/2014   HDL 29* 03/24/2014   CHOLHDL 7.3 04/28/2009 0342   VLDL 23 04/28/2009 0342   LDLCALC 69 03/24/2014    BMET    Component Value Date/Time   NA 136* 07/14/2014 1507   NA 140 03/24/2014   K 5.0 07/14/2014 1507   CL 99 07/14/2014 1507   CO2 24 07/14/2014 1507   GLUCOSE 131* 07/14/2014 1507   BUN 32* 07/14/2014 1507   BUN 27* 03/24/2014   CREATININE 1.19* 07/14/2014 1507   CREATININE 1.2* 03/24/2014   CALCIUM 9.4 07/14/2014 1507   GFRNONAA 38* 07/14/2014 1507   GFRAA 45* 07/14/2014 1507      No Known Allergies  Current Outpatient Prescriptions  Medication Sig Dispense Refill  . aspirin EC 81 MG tablet Take 81 mg by mouth every evening.      Marland Kitchen atorvastatin (LIPITOR) 20 MG tablet Take 20 mg by mouth daily.      . brimonidine (ALPHAGAN) 0.2 % ophthalmic solution Place 1 drop into both eyes 2 (two) times daily.      . diphenhydrAMINE (SOMINEX) 25 MG tablet Take 25  mg by mouth at bedtime.      Marland Kitchen ezetimibe (ZETIA) 10 MG tablet Take 10 mg by mouth daily.      Marland Kitchen imipramine (TOFRANIL) 25 MG tablet TAKE 2 TABLETS BY MOUTH DAILY AT BEDTIME TO REDUCE URINARY FREQUENCY.  60 tablet  5  . latanoprost (XALATAN) 0.005 % ophthalmic solution Place 1 drop into both eyes every evening. UAD      . levothyroxine (SYNTHROID, LEVOTHROID) 50 MCG tablet Take 50 mcg by mouth daily before breakfast.      . metoprolol tartrate (LOPRESSOR) 25 MG tablet Take 1 tablet (25 mg total) by mouth 3 (three) times daily.  270 tablet  3  . Multiple Vitamin (MULTIVITAMIN WITH MINERALS) TABS Take 1 tablet by mouth daily. Certavite-A      . omeprazole (PRILOSEC) 40 MG capsule Take 40 mg by mouth daily.       No current facility-administered medications for this visit.    Past Medical History  Diagnosis  Date  . SVT (supraventricular tachycardia)     Diagnosed 2010  . HTN (hypertension)   . Dyslipidemia   . CAD (coronary artery disease)     a. NSTEMI 2010 in setting of SVT, with cath with unsuccessful PCI - chronic total occlusion of LAD, R->L collaterals.  . GI bleeding   . Unspecified hypothyroidism   . Unspecified hereditary and idiopathic peripheral neuropathy   . Pneumonia, organism unspecified   . Basal cell carcinoma of skin of trunk, except scrotum   . Unspecified malignant neoplasm of skin of lower limb, including hip   . Basal cell carcinoma of skin of lower limb, including hip   . Unspecified glaucoma     left eye  . Other premature beats   . Other esophagitis   . Esophageal reflux   . Osteoarthrosis, unspecified whether generalized or localized, unspecified site     multiple joints  . Pain in joint, shoulder region   . Pain in joint, lower leg     knee  . Pain in joint, ankle and foot     right  . Sacroiliitis, not elsewhere classified   . Cervicalgia   . Lumbago   . Ganglion of tendon sheath   . Pathologic fracture of vertebrae   . Tietze's disease   . Insomnia, unspecified   . Disturbance of skin sensation   . Edema   . Palpitations   . Other nonspecific abnormal serum enzyme levels   . Nasal bones, closed fracture   . Closed fracture of unspecified trochanteric section of femur   . Open wound of knee, leg (except thigh), and ankle, without mention of complication   . Hip, thigh, leg, and ankle, abrasion or friction burn, without mention of infection   . Contusion of hip   . Fall from other slipping, tripping, or stumbling   . Personal history of fall   . Dyspepsia and other specified disorders of function of stomach   . Paroxysmal supraventricular tachycardia   . Acute bronchiolitis due to other infectious organisms 03/02/2013  . Other abnormal blood chemistry 03/15/2013  . Contusion of rib on left side 08/03/2013    Past Surgical History  Procedure  Laterality Date  . Hip surgery  2001  . Breast lumpectomy  1995    right breast, negative for malignancy  . Cataract extraction  2003    bilateral  . Leg skin lesion  biopsy / excision Left 08/22/2010    Lower leg shave biopsy -solar keratosis (  Dr. Radford Pax)    LKT:GYBWLSL:HT colds or fevers, no weight changes Skin:no rashes or ulcers, many abrasions from falls, dark scarred skin HEENT:no blurred vision, no congestion CV:see HPI PUL:see HPI GI:no diarrhea constipation or melena, no indigestion GU:no hematuria, no dysuria MS:no joint pain, no claudication Neuro:+ syncope, + lightheadedness Endo:no diabetes, + thyroid disease  Wt Readings from Last 3 Encounters:  08/03/14 139 lb 9.6 oz (63.322 kg)  07/18/14 138 lb (62.596 kg)  05/31/14 138 lb (62.596 kg)    PHYSICAL EXAM BP 100/52  Pulse 56  Ht 5\' 5"  (1.651 m)  Wt 139 lb 9.6 oz (63.322 kg)  BMI 23.23 kg/m2 Orthostatic vital signs were fairly stable today lying 119/68 standing 111/59 General:Pleasant affect, NAD Skin:Warm and dry, brisk capillary refill HEENT:normocephalic, sclera clear, mucus membranes moist Neck:supple, no JVD, no bruits  Heart:S1S2 RRR with 1/6 systolic murmur, no gallup, rub or click Lungs:clear without rales, rhonchi, or wheezes DSK:AJGO, non tender, + BS, do not palpate liver spleen or masses Ext:no lower ext edema,  2+ radial pulses, 2+ pedal Neuro:alert and oriented X 3, MAE, follows commands, + facial symmetry, poor historian  EKG:S brady with LBBB  ASSESSMENT AND PLAN PSVT More frequent episodes, one associated probably with syncope she did have the syncope there was just no arrhythmia when EMS arrived at a later in the emergency room she developed SVT.  Other times she is bradycardic in the 50s sinus bradycardia.  Combination tachybradycardia syndrome at age 68 we would like to prevent need for pacemaker if at all possible.  Discussed with Dr. Percival Spanish-  Will increase Lopressor to 25 mg every 8  hours.  I instructed both patient and her daughter if she became weak tired no energy or more frequent falls she should call as as she may have increased bradycardia but hopefully this will just prevent SVT.  HYPERTENSION At times elevated 172/90 at other times hypotensive 85 systolic.  We're stopping the isosorbide and hydralazine the episodes documented of hypertension have been much less than episodes of hypotension.  The caregivers we'll continue to monitor her blood pressure.  Hypotension, unspecified See above  CORONARY ATHEROSCLEROSIS NATIVE CORONARY ARTERY History of LAD disease unable to intervene,  60% left circumflex RCA was large and bifurcates distally into PDA and PLA and there were patent.  Syncope See above   She will either followup with me or Dr. Percival Spanish in 4 weeks. They've been instructed to call if symptoms increase.

## 2014-08-03 NOTE — Assessment & Plan Note (Signed)
See above

## 2014-08-03 NOTE — Assessment & Plan Note (Addendum)
More frequent episodes, one associated probably with syncope she did have the syncope there was just no arrhythmia when EMS arrived at a later in the emergency room she developed SVT.  Other times she is bradycardic in the 50s sinus bradycardia.  Combination tachybradycardia syndrome at age 80 we would like to prevent need for pacemaker if at all possible.  Discussed with Dr. Percival Spanish-  Will increase Lopressor to 25 mg every 8 hours.  I instructed both patient and her daughter if she became weak tired no energy or more frequent falls she should call as as she may have increased bradycardia but hopefully this will just prevent SVT.

## 2014-08-03 NOTE — Patient Instructions (Signed)
STOP your IMDUR and Hydralazine  INCREASE your Lopressor to THREE times daily  Your physician recommends that you schedule a follow-up appointment in: 1 month with Dr.Hochrein

## 2014-08-03 NOTE — Assessment & Plan Note (Signed)
At times elevated 172/90 at other times hypotensive 85 systolic.  We're stopping the isosorbide and hydralazine the episodes documented of hypertension have been much less than episodes of hypotension.  The caregivers we'll continue to monitor her blood pressure.

## 2014-08-03 NOTE — Assessment & Plan Note (Signed)
History of LAD disease unable to intervene,  60% left circumflex RCA was large and bifurcates distally into PDA and PLA and there were patent.

## 2014-08-08 ENCOUNTER — Encounter: Payer: Medicare Other | Admitting: Internal Medicine

## 2014-08-15 ENCOUNTER — Other Ambulatory Visit: Payer: Self-pay | Admitting: Internal Medicine

## 2014-09-02 ENCOUNTER — Emergency Department (HOSPITAL_COMMUNITY)
Admission: EM | Admit: 2014-09-02 | Discharge: 2014-09-02 | Disposition: A | Discharge status: Home / Self Care | Payer: Medicare Other | Attending: Emergency Medicine | Admitting: Emergency Medicine

## 2014-09-02 ENCOUNTER — Encounter (HOSPITAL_COMMUNITY): Payer: Self-pay | Admitting: Emergency Medicine

## 2014-09-02 DIAGNOSIS — Z85828 Personal history of other malignant neoplasm of skin: Secondary | ICD-10-CM | POA: Diagnosis not present

## 2014-09-02 DIAGNOSIS — Z87828 Personal history of other (healed) physical injury and trauma: Secondary | ICD-10-CM | POA: Insufficient documentation

## 2014-09-02 DIAGNOSIS — Z8781 Personal history of (healed) traumatic fracture: Secondary | ICD-10-CM | POA: Insufficient documentation

## 2014-09-02 DIAGNOSIS — Z7982 Long term (current) use of aspirin: Secondary | ICD-10-CM | POA: Diagnosis not present

## 2014-09-02 DIAGNOSIS — E039 Hypothyroidism, unspecified: Secondary | ICD-10-CM | POA: Insufficient documentation

## 2014-09-02 DIAGNOSIS — G4700 Insomnia, unspecified: Secondary | ICD-10-CM | POA: Diagnosis not present

## 2014-09-02 DIAGNOSIS — I10 Essential (primary) hypertension: Secondary | ICD-10-CM | POA: Insufficient documentation

## 2014-09-02 DIAGNOSIS — Z8701 Personal history of pneumonia (recurrent): Secondary | ICD-10-CM | POA: Insufficient documentation

## 2014-09-02 DIAGNOSIS — Z8669 Personal history of other diseases of the nervous system and sense organs: Secondary | ICD-10-CM | POA: Insufficient documentation

## 2014-09-02 DIAGNOSIS — Z9181 History of falling: Secondary | ICD-10-CM | POA: Diagnosis not present

## 2014-09-02 DIAGNOSIS — Z79899 Other long term (current) drug therapy: Secondary | ICD-10-CM | POA: Insufficient documentation

## 2014-09-02 DIAGNOSIS — M1990 Unspecified osteoarthritis, unspecified site: Secondary | ICD-10-CM | POA: Diagnosis not present

## 2014-09-02 DIAGNOSIS — H409 Unspecified glaucoma: Secondary | ICD-10-CM | POA: Insufficient documentation

## 2014-09-02 DIAGNOSIS — G609 Hereditary and idiopathic neuropathy, unspecified: Secondary | ICD-10-CM | POA: Diagnosis not present

## 2014-09-02 DIAGNOSIS — K219 Gastro-esophageal reflux disease without esophagitis: Secondary | ICD-10-CM | POA: Insufficient documentation

## 2014-09-02 DIAGNOSIS — I951 Orthostatic hypotension: Secondary | ICD-10-CM | POA: Diagnosis not present

## 2014-09-02 DIAGNOSIS — I2510 Atherosclerotic heart disease of native coronary artery without angina pectoris: Secondary | ICD-10-CM | POA: Insufficient documentation

## 2014-09-02 DIAGNOSIS — R5381 Other malaise: Secondary | ICD-10-CM | POA: Insufficient documentation

## 2014-09-02 DIAGNOSIS — E785 Hyperlipidemia, unspecified: Secondary | ICD-10-CM | POA: Insufficient documentation

## 2014-09-02 DIAGNOSIS — R5383 Other fatigue: Secondary | ICD-10-CM

## 2014-09-02 LAB — CBC WITH DIFFERENTIAL/PLATELET
BASOS ABS: 0 10*3/uL (ref 0.0–0.1)
Basophils Relative: 0 % (ref 0–1)
EOS PCT: 3 % (ref 0–5)
Eosinophils Absolute: 0.2 10*3/uL (ref 0.0–0.7)
HCT: 43 % (ref 36.0–46.0)
Hemoglobin: 14.6 g/dL (ref 12.0–15.0)
Lymphocytes Relative: 21 % (ref 12–46)
Lymphs Abs: 1.3 10*3/uL (ref 0.7–4.0)
MCH: 33.2 pg (ref 26.0–34.0)
MCHC: 34 g/dL (ref 30.0–36.0)
MCV: 97.7 fL (ref 78.0–100.0)
Monocytes Absolute: 0.6 10*3/uL (ref 0.1–1.0)
Monocytes Relative: 9 % (ref 3–12)
NEUTROS ABS: 4.3 10*3/uL (ref 1.7–7.7)
Neutrophils Relative %: 67 % (ref 43–77)
PLATELETS: 173 10*3/uL (ref 150–400)
RBC: 4.4 MIL/uL (ref 3.87–5.11)
RDW: 13.1 % (ref 11.5–15.5)
WBC: 6.4 10*3/uL (ref 4.0–10.5)

## 2014-09-02 LAB — URINALYSIS, ROUTINE W REFLEX MICROSCOPIC
BILIRUBIN URINE: NEGATIVE
Glucose, UA: NEGATIVE mg/dL
Hgb urine dipstick: NEGATIVE
Ketones, ur: NEGATIVE mg/dL
NITRITE: NEGATIVE
PROTEIN: NEGATIVE mg/dL
SPECIFIC GRAVITY, URINE: 1.014 (ref 1.005–1.030)
UROBILINOGEN UA: 0.2 mg/dL (ref 0.0–1.0)
pH: 7.5 (ref 5.0–8.0)

## 2014-09-02 LAB — BASIC METABOLIC PANEL
ANION GAP: 10 (ref 5–15)
BUN: 26 mg/dL — ABNORMAL HIGH (ref 6–23)
CALCIUM: 9 mg/dL (ref 8.4–10.5)
CO2: 25 mEq/L (ref 19–32)
Chloride: 106 mEq/L (ref 96–112)
Creatinine, Ser: 0.94 mg/dL (ref 0.50–1.10)
GFR calc Af Amer: 59 mL/min — ABNORMAL LOW (ref >90)
GFR calc non Af Amer: 51 mL/min — ABNORMAL LOW (ref >90)
Glucose, Bld: 104 mg/dL — ABNORMAL HIGH (ref 70–99)
Potassium: 4.4 mEq/L (ref 3.7–5.3)
SODIUM: 141 meq/L (ref 137–147)

## 2014-09-02 LAB — TROPONIN I: Troponin I: <0.3 ng/mL (ref <0.30)

## 2014-09-02 LAB — CK: Total CK: 112 U/L (ref 7–177)

## 2014-09-02 LAB — URINE MICROSCOPIC-ADD ON

## 2014-09-02 MED ORDER — SODIUM CHLORIDE 0.9 % IV BOLUS (SEPSIS)
1000.0000 mL | Freq: Once | INTRAVENOUS | Status: AC
  Administered 2014-09-02: 1000 mL via INTRAVENOUS

## 2014-09-02 MED ORDER — METOPROLOL TARTRATE 25 MG PO TABS
25.0000 mg | ORAL_TABLET | Freq: Three times a day (TID) | ORAL | Status: DC
  Administered 2014-09-02: 25 mg via ORAL
  Filled 2014-09-02: qty 1

## 2014-09-02 NOTE — ED Notes (Addendum)
To ED via [facility] from [facility] -- with c/o weakness-- started at 2am last night, states got up to go to BR, felt weak, fell-- was unable to get up, called for help. Assisted to bed by staff. C/o weaknees on arrival-- pt is alert, oriented x 3.

## 2014-09-02 NOTE — ED Provider Notes (Signed)
CSN: [id number]     Arrival date & time 09/02/14  12 History   First MD Initiated Contact with Patient 09/02/14 1134     Chief Complaint  Patient presents with  . Weakness     (Consider location/radiation/quality/duration/timing/severity/associated sxs/prior Treatment) HPI Comments: Pt comes in with cc of weakness. Pt has hx of SVT, HTN, HL. She lives at an independent living center. Pt has no hx of CAD, Strokes. States that she fell around 2 am. Pt got up to go to the bathroom, felt weak, dizzy, and fell backwards. When the RNs went for check in the am, pt was helped up, and EMS was called. Pt was orthostatic per EMS report. PT is not sure why she couldn't get up. She denies any pelvic pain. Pt thinks she might have hit her head. Pt denies headache, nausea, emesis, fevers, chills, chest pains, shortness of breath, abdominal pain, uti like symptoms.   Patient is a 80 y.o. female presenting with weakness. The history is provided by the patient.  Weakness Pertinent negatives include no chest pain, no abdominal pain and no shortness of breath.    Past Medical History  Diagnosis Date  . SVT (supraventricular tachycardia)     Diagnosed 2010  . HTN (hypertension)   . Dyslipidemia   . CAD (coronary artery disease)     a. NSTEMI 2010 in setting of SVT, with cath with unsuccessful PCI - chronic total occlusion of LAD, R->L collaterals.  . GI bleeding   . Unspecified hypothyroidism   . Unspecified hereditary and idiopathic peripheral neuropathy   . Pneumonia, organism unspecified   . Basal cell carcinoma of skin of trunk, except scrotum   . Unspecified malignant neoplasm of skin of lower limb, including hip   . Basal cell carcinoma of skin of lower limb, including hip   . Unspecified glaucoma     left eye  . Other premature beats   . Other esophagitis   . Esophageal reflux   . Osteoarthrosis, unspecified whether generalized or localized, unspecified site     multiple joints  . Pain in  joint, shoulder region   . Pain in joint, lower leg     knee  . Pain in joint, ankle and foot     right  . Sacroiliitis, not elsewhere classified   . Cervicalgia   . Lumbago   . Ganglion of tendon sheath   . Pathologic fracture of vertebrae   . Tietze's disease   . Insomnia, unspecified   . Disturbance of skin sensation   . Edema   . Palpitations   . Other nonspecific abnormal serum enzyme levels   . Nasal bones, closed fracture   . Closed fracture of unspecified trochanteric section of femur   . Open wound of knee, leg (except thigh), and ankle, without mention of complication   . Hip, thigh, leg, and ankle, abrasion or friction burn, without mention of infection   . Contusion of hip   . Fall from other slipping, tripping, or stumbling   . Personal history of fall   . Dyspepsia and other specified disorders of function of stomach   . Paroxysmal supraventricular tachycardia   . Acute bronchiolitis due to other infectious organisms 03/02/2013  . Other abnormal blood chemistry 03/15/2013  . Contusion of rib on left side 08/03/2013   Past Surgical History  Procedure Laterality Date  . Hip surgery  2001  . Breast lumpectomy  1995    right breast, negative for malignancy  .  Cataract extraction  2003    bilateral  . Leg skin lesion  biopsy / excision Left 08/22/2010    Lower leg shave biopsy -solar keratosis (Dr. Radford Pax)   Family History  Problem Relation Age of Onset  . Stroke Neg Hx   . Heart attack Neg Hx   . Heart disease Father   . Cancer Brother     lung   History  Substance Use Topics  . Smoking status: Never Smoker   . Smokeless tobacco: Never Used     Comment: denies smoking cigarettes  . Alcohol Use: Yes     Comment: drinks a scotch or wine every other day    OB History   Grav Para Term Preterm Abortions TAB SAB Ect Mult Living                 Review of Systems  Constitutional: Positive for fatigue. Negative for activity change.  HENT: Negative for  facial swelling.   Eyes: Negative for visual disturbance.  Respiratory: Negative for cough and shortness of breath.   Cardiovascular: Negative for chest pain.  Gastrointestinal: Negative for nausea, vomiting, abdominal pain and abdominal distention.  Genitourinary: Negative for dysuria, hematuria and difficulty urinating.  Musculoskeletal: Negative for arthralgias, back pain, myalgias and neck pain.  Skin: Negative for color change and wound.  Neurological: Positive for weakness. Negative for speech difficulty.  Hematological: Does not bruise/bleed easily.  Psychiatric/Behavioral: Negative for confusion.      Allergies  Review of patient's allergies indicates no known allergies.  Home Medications   Prior to Admission medications   Medication Sig Start Date End Date Taking? Authorizing Provider  aspirin EC 81 MG tablet Take 81 mg by mouth every evening.   Yes Historical Provider, MD  atorvastatin (LIPITOR) 20 MG tablet Take 20 mg by mouth daily.   Yes Historical Provider, MD  brimonidine (ALPHAGAN) 0.2 % ophthalmic solution Place 1 drop into both eyes 2 (two) times daily.   Yes Historical Provider, MD  diphenhydrAMINE (SOMINEX) 25 MG tablet Take 25 mg by mouth at bedtime.   Yes Historical Provider, MD  ezetimibe (ZETIA) 10 MG tablet Take 10 mg by mouth daily.   Yes Historical Provider, MD  imipramine (TOFRANIL) 25 MG tablet Take 50 mg by mouth at bedtime. To reduce urinary frequency   Yes Historical Provider, MD  latanoprost (XALATAN) 0.005 % ophthalmic solution Place 1 drop into both eyes every evening. UAD   Yes Historical Provider, MD  levothyroxine (SYNTHROID, LEVOTHROID) 50 MCG tablet Take 50 mcg by mouth daily before breakfast.   Yes Historical Provider, MD  metoprolol tartrate (LOPRESSOR) 25 MG tablet Take 1 tablet (25 mg total) by mouth 3 (three) times daily. 08/03/14  Yes Cecilie Kicks, NP  Multiple Vitamin (MULTIVITAMIN WITH MINERALS) TABS Take 1 tablet by mouth daily.  Certavite-A   Yes Historical Provider, MD  omeprazole (PRILOSEC) 40 MG capsule Take 40 mg by mouth daily.   Yes Historical Provider, MD   BP 215/99  Pulse 68  Temp(Src) 97.6 F (36.4 C) (Oral)  Resp 18  SpO2 100% Physical Exam  Nursing note and vitals reviewed. Constitutional: She is oriented to person, place, and time. She appears well-developed and well-nourished.  HENT:  Head: Normocephalic and atraumatic.  Eyes: EOM are normal. Pupils are equal, round, and reactive to light.  Neck: Neck supple.  Cardiovascular: Normal rate, regular rhythm and normal heart sounds.   No murmur heard. Pulmonary/Chest: Effort normal. No respiratory distress.  Abdominal: Soft.  She exhibits no distension. There is no tenderness. There is no rebound and no guarding.  Neurological: She is alert and oriented to person, place, and time. No cranial nerve deficit. Coordination normal.  Skin: Skin is warm and dry.    ED Course  Procedures (including critical care time) Labs Review Labs Reviewed  BASIC METABOLIC PANEL - Abnormal; Notable for the following:    Glucose, Bld 104 (*)    BUN 26 (*)    GFR calc non Af Amer 51 (*)    GFR calc Af Amer 59 (*)    All other components within normal limits  CBC WITH DIFFERENTIAL  TROPONIN I  CK  TROPONIN I  URINALYSIS, ROUTINE W REFLEX MICROSCOPIC    Imaging Review No results found.   EKG Interpretation   Date/Time:  Friday September 02 2014 10:57:34 EDT Ventricular Rate:  65 PR Interval:  202 QRS Duration: 126 QT Interval:  459 QTC Calculation: 477 R Axis:   -21 Text Interpretation:  Sinus rhythm Left bundle branch block Inferior  infarct, acute unchanged ekg Confirmed by Kathrynn Humble, MD, Thelma Comp [phone]) on  09/02/2014 2:16:17 PM      MDM   Final diagnoses:  Orthostatic hypotension    Pt comes in with cc of weakness. Pt had a fall at 2 am. Unable to get up, felt weak. Fall was mechanical. Head was not hit, and over the course of several hours,  she has no headache, no nausea, no LOC, no seizures, no ams and she is not on coumadin - so we will not get CT head. Neuro exam, including upper and lower ext strength are normal. Infection and ACS screen are not indicative of anything specific either.  Pt observed over long period of time, and has done well. CK is normal as well. Pt ambulated in the ER, and had no problems.  Varney Biles, MD 09/02/14 1651

## 2014-09-02 NOTE — ED Notes (Signed)
Walked patient pt walk well with walker which she uses at home

## 2014-09-02 NOTE — Discharge Instructions (Signed)
We saw you in the ER for there weakness. All the results in the ER are normal, labs and imaging. We are not sure what is causing your symptoms. There was slight dehydration here. The workup in the ER is not complete, and is limited to screening for life threatening and emergent conditions only, so please see a primary care doctor for further evaluation.  Please return to the ER if your symptoms worsen; you have increased pain, fevers, chills, inability to keep any medications down, confusion. Otherwise see the outpatient doctor as requested.   Orthostatic Hypotension Orthostatic hypotension is a sudden drop in blood pressure. It happens when you quickly stand up from a seated or lying position. You may feel dizzy or light-headed. This can last for just a few seconds or for up to a few minutes. It is usually not a serious problem. However, if this happens frequently or gets worse, it can be a sign of something more serious. CAUSES  Different things can cause orthostatic hypotension, including:   Loss of body fluids (dehydration).  Medicines that lower blood pressure.  Sudden changes in posture, such as standing up quickly after you have been sitting or lying down.  Taking too much of your medicine. SIGNS AND SYMPTOMS   Light-headedness or dizziness.   Fainting or near-fainting.   A fast heart rate.   Weakness.   Feeling tired (fatigue).  DIAGNOSIS  Your health care provider may do several things to help diagnose your condition and identify the cause. These may include:   Taking a medical history and doing a physical exam.  Checking your blood pressure. Your health care provider will check your blood pressure when you are:  Lying down.  Sitting.  Standing.  Using tilt table testing. In this test, you lie down on a table that moves from a lying position to a standing position. You will be strapped onto the table. This test monitors your blood pressure and heart rate when  you are in different positions. TREATMENT  Treatment will vary depending on the cause. Possible treatments include:   Changing the dosage of your medicines.  Wearing compression stockings on your lower legs.  Standing up slowly after sitting or lying down.  Eating more salt.  Eating frequent, small meals.  In some cases, getting IV fluids.  Taking medicine to enhance fluid retention. HOME CARE INSTRUCTIONS  Only take over-the-counter or prescription medicines as directed by your health care provider.  Follow your health care provider's instructions for changing the dosage of your current medicines.  Do not stop or adjust your medicine on your own.  Stand up slowly after sitting or lying down. This allows your body to adjust to the different position.  Wear compression stockings as directed.  Eat extra salt as directed.  Do not add extra salt to your diet unless directed to by your health care provider.  Eat frequent, small meals.  Avoid standing suddenly after eating.  Avoid hot showers or excessive heat as directed by your health care provider.  Keep all follow-up appointments. SEEK MEDICAL CARE IF:  You continue to feel dizzy or light-headed after standing.  You feel groggy or confused.  You feel cold, clammy, or sick to your stomach (nauseous).  You have blurred vision.  You feel short of breath. SEEK IMMEDIATE MEDICAL CARE IF:   You faint after standing.  You have chest pain.  You have difficulty breathing.   You lose feeling or movement in your arms or legs.  You have slurred speech or difficulty talking, or you are unable to talk.  MAKE SURE YOU:   Understand these instructions.  Will watch your condition.  Will get help right away if you are not doing well or get worse. Document Released: 12/06/2002 Document Revised: 12/21/2013 Document Reviewed: 10/08/2013 [facility] Patient Information 2015 [place], [place]. This information is not  intended to replace advice given to you by your health care provider. Make sure you discuss any questions you have with your health care provider. Dehydration, Adult Dehydration is when you lose more fluids from the body than you take in. Vital organs like the kidneys, brain, and heart cannot function without a proper amount of fluids and salt. Any loss of fluids from the body can cause dehydration.  CAUSES   Vomiting.  Diarrhea.  Excessive sweating.  Excessive urine output.  Fever. SYMPTOMS  Mild dehydration  Thirst.  Dry lips.  Slightly dry mouth. Moderate dehydration  Very dry mouth.  Sunken eyes.  Skin does not bounce back quickly when lightly pinched and released.  Dark urine and decreased urine production.  Decreased tear production.  Headache. Severe dehydration  Very dry mouth.  Extreme thirst.  Rapid, weak pulse (more than 100 beats per minute at rest).  Cold hands and feet.  Not able to sweat in spite of heat and temperature.  Rapid breathing.  Blue lips.  Confusion and lethargy.  Difficulty being awakened.  Minimal urine production.  No tears. DIAGNOSIS  Your caregiver will diagnose dehydration based on your symptoms and your exam. Blood and urine tests will help confirm the diagnosis. The diagnostic evaluation should also identify the cause of dehydration. TREATMENT  Treatment of mild or moderate dehydration can often be done at home by increasing the amount of fluids that you drink. It is best to drink small amounts of fluid more often. Drinking too much at one time can make vomiting worse. Refer to the home care instructions below. Severe dehydration needs to be treated at the hospital where you will probably be given intravenous (IV) fluids that contain water and electrolytes. HOME CARE INSTRUCTIONS   Ask your caregiver about specific rehydration instructions.  Drink enough fluids to keep your urine clear or pale yellow.  Drink small  amounts frequently if you have nausea and vomiting.  Eat as you normally do.  Avoid:  Foods or drinks high in sugar.  Carbonated drinks.  Juice.  Extremely hot or cold fluids.  Drinks with caffeine.  Fatty, greasy foods.  Alcohol.  Tobacco.  Overeating.  Gelatin desserts.  Wash your hands well to avoid spreading bacteria and viruses.  Only take over-the-counter or prescription medicines for pain, discomfort, or fever as directed by your caregiver.  Ask your caregiver if you should continue all prescribed and over-the-counter medicines.  Keep all follow-up appointments with your caregiver. SEEK MEDICAL CARE IF:  You have abdominal pain and it increases or stays in one area (localizes).  You have a rash, stiff neck, or severe headache.  You are irritable, sleepy, or difficult to awaken.  You are weak, dizzy, or extremely thirsty. SEEK IMMEDIATE MEDICAL CARE IF:   You are unable to keep fluids down or you get worse despite treatment.  You have frequent episodes of vomiting or diarrhea.  You have blood or green matter (bile) in your vomit.  You have blood in your stool or your stool looks black and tarry.  You have not urinated in 6 to 8 hours, or you have only  urinated a small amount of very dark urine.  You have a fever.  You faint. MAKE SURE YOU:   Understand these instructions.  Will watch your condition.  Will get help right away if you are not doing well or get worse. Document Released: 12/16/2005 Document Revised: 03/09/2012 Document Reviewed: 08/05/2011 [facility] Patient Information 2015 [place], [place]. This information is not intended to replace advice given to you by your health care provider. Make sure you discuss any questions you have with your health care provider.

## 2014-09-06 ENCOUNTER — Emergency Department (HOSPITAL_COMMUNITY): Payer: Medicare Other

## 2014-09-06 ENCOUNTER — Emergency Department (HOSPITAL_COMMUNITY)
Admission: EM | Admit: 2014-09-06 | Discharge: 2014-09-06 | Disposition: A | Discharge status: Home / Self Care | Payer: Medicare Other | Attending: Emergency Medicine | Admitting: Emergency Medicine

## 2014-09-06 ENCOUNTER — Encounter (HOSPITAL_COMMUNITY): Payer: Self-pay | Admitting: Emergency Medicine

## 2014-09-06 DIAGNOSIS — M1990 Unspecified osteoarthritis, unspecified site: Secondary | ICD-10-CM | POA: Insufficient documentation

## 2014-09-06 DIAGNOSIS — Y9229 Other specified public building as the place of occurrence of the external cause: Secondary | ICD-10-CM | POA: Insufficient documentation

## 2014-09-06 DIAGNOSIS — I10 Essential (primary) hypertension: Secondary | ICD-10-CM | POA: Insufficient documentation

## 2014-09-06 DIAGNOSIS — Z8709 Personal history of other diseases of the respiratory system: Secondary | ICD-10-CM | POA: Diagnosis not present

## 2014-09-06 DIAGNOSIS — E039 Hypothyroidism, unspecified: Secondary | ICD-10-CM | POA: Insufficient documentation

## 2014-09-06 DIAGNOSIS — Z85828 Personal history of other malignant neoplasm of skin: Secondary | ICD-10-CM | POA: Insufficient documentation

## 2014-09-06 DIAGNOSIS — S1093XA Contusion of unspecified part of neck, initial encounter: Secondary | ICD-10-CM

## 2014-09-06 DIAGNOSIS — Z9181 History of falling: Secondary | ICD-10-CM | POA: Diagnosis not present

## 2014-09-06 DIAGNOSIS — Z9849 Cataract extraction status, unspecified eye: Secondary | ICD-10-CM | POA: Insufficient documentation

## 2014-09-06 DIAGNOSIS — E785 Hyperlipidemia, unspecified: Secondary | ICD-10-CM | POA: Diagnosis not present

## 2014-09-06 DIAGNOSIS — Z7982 Long term (current) use of aspirin: Secondary | ICD-10-CM | POA: Diagnosis not present

## 2014-09-06 DIAGNOSIS — G609 Hereditary and idiopathic neuropathy, unspecified: Secondary | ICD-10-CM | POA: Diagnosis not present

## 2014-09-06 DIAGNOSIS — S81009A Unspecified open wound, unspecified knee, initial encounter: Secondary | ICD-10-CM | POA: Diagnosis not present

## 2014-09-06 DIAGNOSIS — S199XXA Unspecified injury of neck, initial encounter: Secondary | ICD-10-CM

## 2014-09-06 DIAGNOSIS — W010XXA Fall on same level from slipping, tripping and stumbling without subsequent striking against object, initial encounter: Secondary | ICD-10-CM | POA: Diagnosis not present

## 2014-09-06 DIAGNOSIS — I2510 Atherosclerotic heart disease of native coronary artery without angina pectoris: Secondary | ICD-10-CM | POA: Diagnosis not present

## 2014-09-06 DIAGNOSIS — S0181XA Laceration without foreign body of other part of head, initial encounter: Secondary | ICD-10-CM

## 2014-09-06 DIAGNOSIS — K219 Gastro-esophageal reflux disease without esophagitis: Secondary | ICD-10-CM | POA: Insufficient documentation

## 2014-09-06 DIAGNOSIS — Z79899 Other long term (current) drug therapy: Secondary | ICD-10-CM | POA: Insufficient documentation

## 2014-09-06 DIAGNOSIS — S91009A Unspecified open wound, unspecified ankle, initial encounter: Secondary | ICD-10-CM

## 2014-09-06 DIAGNOSIS — Z8781 Personal history of (healed) traumatic fracture: Secondary | ICD-10-CM | POA: Diagnosis not present

## 2014-09-06 DIAGNOSIS — R001 Bradycardia, unspecified: Secondary | ICD-10-CM

## 2014-09-06 DIAGNOSIS — S81011A Laceration without foreign body, right knee, initial encounter: Secondary | ICD-10-CM

## 2014-09-06 DIAGNOSIS — Z8701 Personal history of pneumonia (recurrent): Secondary | ICD-10-CM | POA: Diagnosis not present

## 2014-09-06 DIAGNOSIS — S0003XA Contusion of scalp, initial encounter: Secondary | ICD-10-CM | POA: Insufficient documentation

## 2014-09-06 DIAGNOSIS — I498 Other specified cardiac arrhythmias: Secondary | ICD-10-CM | POA: Diagnosis not present

## 2014-09-06 DIAGNOSIS — H409 Unspecified glaucoma: Secondary | ICD-10-CM | POA: Insufficient documentation

## 2014-09-06 DIAGNOSIS — S81809A Unspecified open wound, unspecified lower leg, initial encounter: Secondary | ICD-10-CM | POA: Diagnosis not present

## 2014-09-06 DIAGNOSIS — S022XXA Fracture of nasal bones, initial encounter for closed fracture: Secondary | ICD-10-CM | POA: Diagnosis not present

## 2014-09-06 DIAGNOSIS — S0180XA Unspecified open wound of other part of head, initial encounter: Secondary | ICD-10-CM | POA: Diagnosis not present

## 2014-09-06 DIAGNOSIS — S0083XA Contusion of other part of head, initial encounter: Secondary | ICD-10-CM | POA: Insufficient documentation

## 2014-09-06 DIAGNOSIS — S0993XA Unspecified injury of face, initial encounter: Secondary | ICD-10-CM | POA: Insufficient documentation

## 2014-09-06 DIAGNOSIS — Y9301 Activity, walking, marching and hiking: Secondary | ICD-10-CM | POA: Insufficient documentation

## 2014-09-06 MED ORDER — METOPROLOL TARTRATE 25 MG PO TABS: 25.0000 mg | ORAL_TABLET | Freq: Two times a day (BID) | ORAL | Status: DC

## 2014-09-06 MED ORDER — ACETAMINOPHEN 325 MG PO TABS
650.0000 mg | ORAL_TABLET | Freq: Once | ORAL | Status: AC
  Administered 2014-09-06: 650 mg via ORAL
  Filled 2014-09-06: qty 2

## 2014-09-06 NOTE — ED Provider Notes (Signed)
i was requested to perform laceration repair on R knee and bridge of nose.    LACERATION REPAIR Performed by: Domenic Moras Authorized byDomenic Moras Consent: Verbal consent obtained. Risks and benefits: risks, benefits and alternatives were discussed Consent given by: patient Patient identity confirmed: provided demographic data Prepped and Draped in normal sterile fashion Wound explored  Laceration Location: bridge of nose (superior)  Laceration Length: 0.3 cm  No Foreign Bodies seen or palpated  Anesthesia: local infiltration  Local anesthetic: lidocaine 2% w epinephrine  Anesthetic total: 1 ml  Irrigation method: syringe Amount of cleaning: standard  Skin closure: prolene 5.0  Number of sutures: 1  Technique: simple interrupted  Patient tolerance: Patient tolerated the procedure well with no immediate complications.  LACERATION REPAIR Performed by: Domenic Moras Authorized byDomenic Moras Consent: Verbal consent obtained. Risks and benefits: risks, benefits and alternatives were discussed Consent given by: patient Patient identity confirmed: provided demographic data Prepped and Draped in normal sterile fashion Wound explored  Laceration Location: bridge of nose (inferior)  Laceration Length: 1cm  No Foreign Bodies seen or palpated  Anesthesia: local infiltration  Local anesthetic: lidocaine 2% w epinephrine  Anesthetic total: 1 ml  Irrigation method: syringe Amount of cleaning: standard  Skin closure: prolene 5.0  Number of sutures: 2  Technique: simple interrupted  Patient tolerance: Patient tolerated the procedure well with no immediate complications.  LACERATION REPAIR Performed by: Domenic Moras Authorized byDomenic Moras Consent: Verbal consent obtained. Risks and benefits: risks, benefits and alternatives were discussed Consent given by: patient Patient identity confirmed: provided demographic data Prepped and Draped in normal sterile  fashion Wound explored  Laceration Location: R anterior knee, deep with skin tear  Laceration Length: 5cm  No Foreign Bodies seen or palpated  Anesthesia: local infiltration  Local anesthetic: lidocaine 2% w epinephrine  Anesthetic total: 4 ml  Irrigation method: syringe Amount of cleaning: standard  Skin closure: prolene 5.0  Number of sutures: 7  Technique: simple interrupted sutures, sharp surgical debridgement of skin with sterile scissor, approximation of skin, ligation of arterial bleed.    Patient tolerance: Patient tolerated the procedure well with no immediate complications.    Domenic Moras, PA-C 09/06/14 1233

## 2014-09-06 NOTE — Discharge Instructions (Signed)
As your heart rate is quite low today 45-55, decrease your metoprolol dose to 25 mg 2x/day.  Discuss any further adjustment in your medications/dosing with your cardiologist at follow up with them this week. For nasal fracture, elevate head, ice/coldpack to sore area.  Keep wounds very clean.  May use saline nasal drops as need for congestion. Follow up with ENT doctor in the next couple weeks if any issues/concern with nose/healing.   Take tylenol as need.  For knee laceration/skin tear, avoid bending your knee for the next few weeks, as that may cause stitches to tear and wound to open up. Keep area very clean. No driving for the next few weeks, and until cleared to do so by your doctor.   Follow fall precautions, and use assistance and/or walker whenever up and walking.   Given fall/injuries, and fall risk, please move patient to assisted living/rehabilitation side of her extended care facility.   Return to ER if worse, new symptoms, severe pain, change in mental status, fevers, infection of wounds, other concern.   Fall Prevention and Home Safety Falls cause injuries and can affect all age groups. It is possible to use preventive measures to significantly decrease the likelihood of falls. There are many simple measures which can make your home safer and prevent falls. OUTDOORS  Repair cracks and edges of walkways and driveways.  Remove high doorway thresholds.  Trim shrubbery on the main path into your home.  Have good outside lighting.  Clear walkways of tools, rocks, debris, and clutter.  Check that handrails are not broken and are securely fastened. Both sides of steps should have handrails.  Have leaves, snow, and ice cleared regularly.  Use sand or salt on walkways during winter months.  In the garage, clean up grease or oil spills. BATHROOM  Install night lights.  Install grab bars by the toilet and in the tub and shower.  Use non-skid mats or decals in the tub or  shower.  Place a plastic non-slip stool in the shower to sit on, if needed.  Keep floors dry and clean up all water on the floor immediately.  Remove soap buildup in the tub or shower on a regular basis.  Secure bath mats with non-slip, double-sided rug tape.  Remove throw rugs and tripping hazards from the floors. BEDROOMS  Install night lights.  Make sure a bedside light is easy to reach.  Do not use oversized bedding.  Keep a telephone by your bedside.  Have a firm chair with side arms to use for getting dressed.  Remove throw rugs and tripping hazards from the floor. KITCHEN  Keep handles on pots and pans turned toward the center of the stove. Use back burners when possible.  Clean up spills quickly and allow time for drying.  Avoid walking on wet floors.  Avoid hot utensils and knives.  Position shelves so they are not too high or low.  Place commonly used objects within easy reach.  If necessary, use a sturdy step stool with a grab bar when reaching.  Keep electrical cables out of the way.  Do not use floor polish or wax that makes floors slippery. If you must use wax, use non-skid floor wax.  Remove throw rugs and tripping hazards from the floor. STAIRWAYS  Never leave objects on stairs.  Place handrails on both sides of stairways and use them. Fix any loose handrails. Make sure handrails on both sides of the stairways are as long as the stairs.  Check carpeting to make sure it is firmly attached along stairs. Make repairs to worn or loose carpet promptly.  Avoid placing throw rugs at the top or bottom of stairways, or properly secure the rug with carpet tape to prevent slippage. Get rid of throw rugs, if possible.  Have an electrician put in a light switch at the top and bottom of the stairs. OTHER FALL PREVENTION TIPS  Wear low-heel or rubber-soled shoes that are supportive and fit well. Wear closed toe shoes.  When using a stepladder, make sure it  is fully opened and both spreaders are firmly locked. Do not climb a closed stepladder.  Add color or contrast paint or tape to grab bars and handrails in your home. Place contrasting color strips on first and last steps.  Learn and use mobility aids as needed. Install an electrical emergency response system.  Turn on lights to avoid dark areas. Replace light bulbs that burn out immediately. Get light switches that glow.  Arrange furniture to create clear pathways. Keep furniture in the same place.  Firmly attach carpet with non-skid or double-sided tape.  Eliminate uneven floor surfaces.  Select a carpet pattern that does not visually hide the edge of steps.  Be aware of all pets. OTHER HOME SAFETY TIPS  Set the water temperature for 120 F (48.8 C).  Keep emergency numbers on or near the telephone.  Keep smoke detectors on every level of the home and near sleeping areas. Document Released: 12/06/2002 Document Revised: 06/16/2012 Document Reviewed: 03/06/2012 [facility] Patient Information 2015 [place], [place]. This information is not intended to replace advice given to you by your health care provider. Make sure you discuss any questions you have with your health care provider.    Nasal Fracture A nasal fracture is a break or crack in the bones of the nose. A minor break usually heals in a month. You often will receive black eyes from a nasal fracture. This is not a cause for concern. The black eyes will go away over 1 to 2 weeks.  DIAGNOSIS  Your caregiver may want to examine you if you are concerned about a fracture of the nose. X-rays of the nose may not show a nasal fracture even when one is present. Sometimes your caregiver must wait 1 to 5 days after the injury to re-check the nose for alignment and to take additional X-rays. Sometimes the caregiver must wait until the swelling has gone down. TREATMENT Minor fractures that have caused no deformity often do not require  treatment. More serious fractures where bones are displaced may require surgery. This will take place after the swelling is gone. Surgery will stabilize and align the fracture. HOME CARE INSTRUCTIONS   Put ice on the injured area.  Put ice in a plastic bag.  Place a towel between your skin and the bag.  Leave the ice on for 15-20 minutes, 03-04 times a day.  Take medications as directed by your caregiver.  Only take over-the-counter or prescription medicines for pain, discomfort, or fever as directed by your caregiver.  If your nose starts bleeding, squeeze the soft parts of the nose against the center wall while you are sitting in an upright position for 10 minutes.  Contact sports should be avoided for at least 3 to 4 weeks or as directed by your caregiver. SEEK MEDICAL CARE IF:  Your pain increases or becomes severe.  You continue to have nosebleeds.  The shape of your nose does not return to  normal within 5 days.  You have pus draining from the nose. SEEK IMMEDIATE MEDICAL CARE IF:   You have bleeding from your nose that does not stop after 20 minutes of pinching the nostrils closed and keeping ice on the nose.  You have clear fluid draining from your nose.  You notice a grape-like swelling on the dividing wall between the nostrils (septum). This is a collection of blood (hematoma) that must be drained to help prevent infection.  You have difficulty moving your eyes.  You have recurrent vomiting. Document Released: 12/13/2000 Document Revised: 03/09/2012 Document Reviewed: 04/01/2011 [facility] Patient Information 2015 [place], [place]. This information is not intended to replace advice given to you by your health care provider. Make sure you discuss any questions you have with your health care provider.    Laceration Care, Adult A laceration is a cut or lesion that goes through all layers of the skin and into the tissue just beneath the skin. TREATMENT  Some  lacerations may not require closure. Some lacerations may not be able to be closed due to an increased risk of infection. It is important to see your caregiver as soon as possible after an injury to minimize the risk of infection and maximize the opportunity for successful closure. If closure is appropriate, pain medicines may be given, if needed. The wound will be cleaned to help prevent infection. Your caregiver will use stitches (sutures), staples, wound glue (adhesive), or skin adhesive strips to repair the laceration. These tools bring the skin edges together to allow for faster healing and a better cosmetic outcome. However, all wounds will heal with a scar. Once the wound has healed, scarring can be minimized by covering the wound with sunscreen during the day for 1 full year. HOME CARE INSTRUCTIONS  For sutures or staples:  Keep the wound clean and dry.  If you were given a bandage (dressing), you should change it at least once a day. Also, change the dressing if it becomes wet or dirty, or as directed by your caregiver.  Wash the wound with soap and water 2 times a day. Rinse the wound off with water to remove all soap. Pat the wound dry with a clean towel.  After cleaning, apply a thin layer of the antibiotic ointment as recommended by your caregiver. This will help prevent infection and keep the dressing from sticking.  You may shower as usual after the first 24 hours. Do not soak the wound in water until the sutures are removed.  Only take over-the-counter or prescription medicines for pain, discomfort, or fever as directed by your caregiver.  Get your sutures or staples removed as directed by your caregiver. For skin adhesive strips:  Keep the wound clean and dry.  Do not get the skin adhesive strips wet. You may bathe carefully, using caution to keep the wound dry.  If the wound gets wet, pat it dry with a clean towel.  Skin adhesive strips will fall off on their own. You may  trim the strips as the wound heals. Do not remove skin adhesive strips that are still stuck to the wound. They will fall off in time. For wound adhesive:  You may briefly wet your wound in the shower or bath. Do not soak or scrub the wound. Do not swim. Avoid periods of heavy perspiration until the skin adhesive has fallen off on its own. After showering or bathing, gently pat the wound dry with a clean towel.  Do not apply  liquid medicine, cream medicine, or ointment medicine to your wound while the skin adhesive is in place. This may loosen the film before your wound is healed.  If a dressing is placed over the wound, be careful not to apply tape directly over the skin adhesive. This may cause the adhesive to be pulled off before the wound is healed.  Avoid prolonged exposure to sunlight or tanning lamps while the skin adhesive is in place. Exposure to ultraviolet light in the first year will darken the scar.  The skin adhesive will usually remain in place for 5 to 10 days, then naturally fall off the skin. Do not pick at the adhesive film. You may need a tetanus shot if:  You cannot remember when you had your last tetanus shot.  You have never had a tetanus shot. If you get a tetanus shot, your arm may swell, get red, and feel warm to the touch. This is common and not a problem. If you need a tetanus shot and you choose not to have one, there is a rare chance of getting tetanus. Sickness from tetanus can be serious. SEEK MEDICAL CARE IF:   You have redness, swelling, or increasing pain in the wound.  You see a red line that goes away from the wound.  You have yellowish-white fluid (pus) coming from the wound.  You have a fever.  You notice a bad smell coming from the wound or dressing.  Your wound breaks open before or after sutures have been removed.  You notice something coming out of the wound such as wood or glass.  Your wound is on your hand or foot and you cannot move a  finger or toe. SEEK IMMEDIATE MEDICAL CARE IF:   Your pain is not controlled with prescribed medicine.  You have severe swelling around the wound causing pain and numbness or a change in color in your arm, hand, leg, or foot.  Your wound splits open and starts bleeding.  You have worsening numbness, weakness, or loss of function of any joint around or beyond the wound.  You develop painful lumps near the wound or on the skin anywhere on your body. MAKE SURE YOU:   Understand these instructions.  Will watch your condition.  Will get help right away if you are not doing well or get worse. Document Released: 12/16/2005 Document Revised: 03/09/2012 Document Reviewed: 06/11/2011 [facility] Patient Information 2015 [place], [place]. This information is not intended to replace advice given to you by your health care provider. Make sure you discuss any questions you have with your health care provider.    Cryotherapy Cryotherapy means treatment with cold. Ice or gel packs can be used to reduce both pain and swelling. Ice is the most helpful within the first 24 to 48 hours after an injury or flare-up from overusing a muscle or joint. Sprains, strains, spasms, burning pain, shooting pain, and aches can all be eased with ice. Ice can also be used when recovering from surgery. Ice is effective, has very few side effects, and is safe for most people to use. PRECAUTIONS  Ice is not a safe treatment option for people with:  Raynaud phenomenon. This is a condition affecting small blood vessels in the extremities. Exposure to cold may cause your problems to return.  Cold hypersensitivity. There are many forms of cold hypersensitivity, including:  Cold urticaria. Red, itchy hives appear on the skin when the tissues begin to warm after being iced.  Cold erythema. This  is a red, itchy rash caused by exposure to cold.  Cold hemoglobinuria. Red blood cells break down when the tissues begin to warm after  being iced. The hemoglobin that carry oxygen are passed into the urine because they cannot combine with blood proteins fast enough.  Numbness or altered sensitivity in the area being iced. If you have any of the following conditions, do not use ice until you have discussed cryotherapy with your caregiver:  Heart conditions, such as arrhythmia, angina, or chronic heart disease.  High blood pressure.  Healing wounds or open skin in the area being iced.  Current infections.  Rheumatoid arthritis.  Poor circulation.  Diabetes. Ice slows the blood flow in the region it is applied. This is beneficial when trying to stop inflamed tissues from spreading irritating chemicals to surrounding tissues. However, if you expose your skin to cold temperatures for too long or without the proper protection, you can damage your skin or nerves. Watch for signs of skin damage due to cold. HOME CARE INSTRUCTIONS Follow these tips to use ice and cold packs safely.  Place a dry or damp towel between the ice and skin. A damp towel will cool the skin more quickly, so you may need to shorten the time that the ice is used.  For a more rapid response, add gentle compression to the ice.  Ice for no more than 10 to 20 minutes at a time. The bonier the area you are icing, the less time it will take to get the benefits of ice.  Check your skin after 5 minutes to make sure there are no signs of a poor response to cold or skin damage.  Rest 20 minutes or more between uses.  Once your skin is numb, you can end your treatment. You can test numbness by very lightly touching your skin. The touch should be so light that you do not see the skin dimple from the pressure of your fingertip. When using ice, most people will feel these normal sensations in this order: cold, burning, aching, and numbness.  Do not use ice on someone who cannot communicate their responses to pain, such as small children or people with  dementia. HOW TO MAKE AN ICE PACK Ice packs are the most common way to use ice therapy. Other methods include ice massage, ice baths, and cryosprays. Muscle creams that cause a cold, tingly feeling do not offer the same benefits that ice offers and should not be used as a substitute unless recommended by your caregiver. To make an ice pack, do one of the following:  Place crushed ice or a bag of frozen vegetables in a sealable plastic bag. Squeeze out the excess air. Place this bag inside another plastic bag. Slide the bag into a pillowcase or place a damp towel between your skin and the bag.  Mix 3 parts water with 1 part rubbing alcohol. Freeze the mixture in a sealable plastic bag. When you remove the mixture from the freezer, it will be slushy. Squeeze out the excess air. Place this bag inside another plastic bag. Slide the bag into a pillowcase or place a damp towel between your skin and the bag. SEEK MEDICAL CARE IF:  You develop white spots on your skin. This may give the skin a blotchy (mottled) appearance.  Your skin turns blue or pale.  Your skin becomes waxy or hard.  Your swelling gets worse. MAKE SURE YOU:   Understand these instructions.  Will watch your condition.  Will get help right away if you are not doing well or get worse. Document Released: 08/12/2011 Document Revised: 05/02/2014 Document Reviewed: 08/12/2011 [facility] Patient Information 2015 [place], [place]. This information is not intended to replace advice given to you by your health care provider. Make sure you discuss any questions you have with your health care provider.    Bradycardia Bradycardia is a term for a heart rate (pulse) that, in adults, is slower than 60 beats per minute. A normal rate is 60 to 100 beats per minute. A heart rate below 60 beats per minute may be normal for some adults with healthy hearts. If the rate is too slow, the heart may have trouble pumping the volume of blood the body  needs. If the heart rate gets too low, blood flow to the brain may be decreased and may make you feel lightheaded, dizzy, or faint. The heart has a natural pacemaker in the top of the heart called the SA node (sinoatrial or sinus node). This pacemaker sends out regular electrical signals to the muscle of the heart, telling the heart muscle when to beat (contract). The electrical signal travels from the upper parts of the heart (atria) through the AV node (atrioventricular node), to the lower chambers of the heart (ventricles). The ventricles squeeze, pumping the blood from your heart to your lungs and to the rest of your body. CAUSES   Problem with the heart's electrical system.  Problem with the heart's natural pacemaker.  Heart disease, damage, or infection.  Medications.  Problems with minerals and salts (electrolytes). SYMPTOMS   Fainting (syncope).  Fatigue and weakness.  Shortness of breath (dyspnea).  Chest pain (angina).  Drowsiness.  Confusion. DIAGNOSIS   An electrocardiogram (ECG) can help your caregiver determine the type of slow heart rate you have.  If the cause is not seen on an ECG, you may need to wear a heart monitor that records your heart rhythm for several hours or days.  Blood tests. TREATMENT   Electrolyte supplements.  Medications.  Withholding medication which is causing a slow heart rate.  Pacemaker placement. SEEK IMMEDIATE MEDICAL CARE IF:   You feel lightheaded or faint.  You develop an irregular heart rate.  You feel chest pain or have trouble breathing. MAKE SURE YOU:   Understand these instructions.  Will watch your condition.  Will get help right away if you are not doing well or get worse. Document Released: 09/07/2002 Document Revised: 03/09/2012 Document Reviewed: 03/23/2014 [facility] Patient Information 2015 [place], [place]. This information is not intended to replace advice given to you by your health care provider. Make  sure you discuss any questions you have with your health care provider.

## 2014-09-06 NOTE — ED Provider Notes (Signed)
Medical screening examination/treatment/procedure(s) were conducted as a shared visit with non-physician practitioner(s) and myself.  I personally evaluated the patient during the encounter.  See H and P by me/    Mirna Mires, MD 09/06/14 1326

## 2014-09-06 NOTE — ED Provider Notes (Signed)
CSN: [id number]     Arrival date & time 09/06/14  0935 History   First MD Initiated Contact with Patient 09/06/14 [phone]     Chief Complaint  Patient presents with  . Fall     (Consider location/radiation/quality/duration/timing/severity/associated sxs/prior Treatment) Patient is a 80 y.o. female presenting with fall. The history is provided by the patient and the EMS personnel.  Fall Pertinent negatives include no chest pain, no abdominal pain and no shortness of breath.  pt w hx falls, states tripped and fell forward while walking into church this morning. Contusion/abrasions to face and right knee. Dazed. No loc. No faintness or dizziness prior to fall. Dull headache, and pain to nose and right knee. Tetanus up to date within past few years. Denies eye pain or change in vision. No numbness/weakness. No neck or back pain. No chest discomfort or sob. No abd pain or nv. No dysuria or gu c/o. Pt denies recent change in medication. No fever or chills. States felt at baseline, asymptomatic, immediately prior to trip and fall.     Past Medical History  Diagnosis Date  . SVT (supraventricular tachycardia)     Diagnosed 2010  . HTN (hypertension)   . Dyslipidemia   . CAD (coronary artery disease)     a. NSTEMI 2010 in setting of SVT, with cath with unsuccessful PCI - chronic total occlusion of LAD, R->L collaterals.  . GI bleeding   . Unspecified hypothyroidism   . Unspecified hereditary and idiopathic peripheral neuropathy   . Pneumonia, organism unspecified   . Basal cell carcinoma of skin of trunk, except scrotum   . Unspecified malignant neoplasm of skin of lower limb, including hip   . Basal cell carcinoma of skin of lower limb, including hip   . Unspecified glaucoma     left eye  . Other premature beats   . Other esophagitis   . Esophageal reflux   . Osteoarthrosis, unspecified whether generalized or localized, unspecified site     multiple joints  . Pain in joint, shoulder  region   . Pain in joint, lower leg     knee  . Pain in joint, ankle and foot     right  . Sacroiliitis, not elsewhere classified   . Cervicalgia   . Lumbago   . Ganglion of tendon sheath   . Pathologic fracture of vertebrae   . Tietze's disease   . Insomnia, unspecified   . Disturbance of skin sensation   . Edema   . Palpitations   . Other nonspecific abnormal serum enzyme levels   . Nasal bones, closed fracture   . Closed fracture of unspecified trochanteric section of femur   . Open wound of knee, leg (except thigh), and ankle, without mention of complication   . Hip, thigh, leg, and ankle, abrasion or friction burn, without mention of infection   . Contusion of hip   . Fall from other slipping, tripping, or stumbling   . Personal history of fall   . Dyspepsia and other specified disorders of function of stomach   . Paroxysmal supraventricular tachycardia   . Acute bronchiolitis due to other infectious organisms 03/02/2013  . Other abnormal blood chemistry 03/15/2013  . Contusion of rib on left side 08/03/2013   Past Surgical History  Procedure Laterality Date  . Hip surgery  2001  . Breast lumpectomy  1995    right breast, negative for malignancy  . Cataract extraction  2003    bilateral  .  Leg skin lesion  biopsy / excision Left 08/22/2010    Lower leg shave biopsy -solar keratosis (Dr. Radford Pax)   Family History  Problem Relation Age of Onset  . Stroke Neg Hx   . Heart attack Neg Hx   . Heart disease Father   . Cancer Brother     lung   History  Substance Use Topics  . Smoking status: Never Smoker   . Smokeless tobacco: Never Used     Comment: denies smoking cigarettes  . Alcohol Use: Yes     Comment: drinks a scotch or wine every other day    OB History   Grav Para Term Preterm Abortions TAB SAB Ect Mult Living                 Review of Systems  Constitutional: Negative for fever and chills.  HENT: Negative for sore throat.   Eyes: Negative for visual  disturbance.  Respiratory: Negative for cough and shortness of breath.   Cardiovascular: Negative for chest pain and palpitations.  Gastrointestinal: Negative for nausea, vomiting, abdominal pain, diarrhea and blood in stool.  Genitourinary: Negative for dysuria and flank pain.  Musculoskeletal: Negative for back pain and neck pain.  Skin: Positive for wound.  Neurological: Negative for syncope, weakness and numbness.  Hematological: Does not bruise/bleed easily.  Psychiatric/Behavioral: Negative for confusion.      Allergies  Review of patient's allergies indicates no known allergies.  Home Medications   Prior to Admission medications   Medication Sig Start Date End Date Taking? Authorizing Provider  aspirin EC 81 MG tablet Take 81 mg by mouth every evening.    Historical Provider, MD  atorvastatin (LIPITOR) 20 MG tablet Take 20 mg by mouth daily.    Historical Provider, MD  brimonidine (ALPHAGAN) 0.2 % ophthalmic solution Place 1 drop into both eyes 2 (two) times daily.    Historical Provider, MD  diphenhydrAMINE (SOMINEX) 25 MG tablet Take 25 mg by mouth at bedtime.    Historical Provider, MD  ezetimibe (ZETIA) 10 MG tablet Take 10 mg by mouth daily.    Historical Provider, MD  imipramine (TOFRANIL) 25 MG tablet Take 50 mg by mouth at bedtime. To reduce urinary frequency    Historical Provider, MD  latanoprost (XALATAN) 0.005 % ophthalmic solution Place 1 drop into both eyes every evening. UAD    Historical Provider, MD  levothyroxine (SYNTHROID, LEVOTHROID) 50 MCG tablet Take 50 mcg by mouth daily before breakfast.    Historical Provider, MD  metoprolol tartrate (LOPRESSOR) 25 MG tablet Take 1 tablet (25 mg total) by mouth 3 (three) times daily. 08/03/14   Cecilie Kicks, NP  Multiple Vitamin (MULTIVITAMIN WITH MINERALS) TABS Take 1 tablet by mouth daily. Certavite-A    Historical Provider, MD  omeprazole (PRILOSEC) 40 MG capsule Take 40 mg by mouth daily.    Historical Provider, MD    BP 148/59  Pulse 45  Temp(Src) 97.7 F (36.5 C) (Oral)  Resp 17  SpO2 97% Physical Exam  Nursing note and vitals reviewed. Constitutional: She is oriented to person, place, and time. No distress.  Elderly, frail appearing.   HENT:  Mouth/Throat: Oropharynx is clear and moist.  Contusion to forehead, nose, ecchymosis cheeks, periorbital area. No septal hematoma. Abrasion to nose. No malocclusion. Teeth firmly intact.   Eyes: Conjunctivae are normal. Pupils are equal, round, and reactive to light. No scleral icterus.  Neck: Normal range of motion. Neck supple. No tracheal deviation present.  Cardiovascular: Regular  rhythm, normal heart sounds and intact distal pulses.   Bradycardic.   Pulmonary/Chest: Effort normal and breath sounds normal. No respiratory distress. She exhibits no tenderness.  Abdominal: Soft. Normal appearance and bowel sounds are normal. She exhibits no distension and no mass. There is no tenderness. There is no rebound and no guarding.  Genitourinary:  No cva tenderness  Musculoskeletal: She exhibits no edema.  CTLS spine, non tender, aligned, no step off. Good rom bil ext, tenderness right knee, no other pain or focal bony tenderness. Distal pulses palp bil. Right knee stable. Large skin tear/flap laceration to anterior right knee. No effusion.   Neurological: She is alert and oriented to person, place, and time.  Motor intact bil. stre 5/5, sens intact.   Skin: Skin is warm and dry. No rash noted. She is not diaphoretic.  Psychiatric: She has a normal mood and affect.    ED Course  Procedures (including critical care time) Labs Reviewed  Ct Head Wo Contrast  09/06/2014   CLINICAL DATA:  Weakness and recent fall  EXAM: CT HEAD WITHOUT CONTRAST  CT MAXILLOFACIAL WITHOUT CONTRAST  TECHNIQUE: Multidetector CT imaging of the head and maxillofacial structures were performed using the standard protocol without intravenous contrast. Multiplanar CT image  reconstructions of the maxillofacial structures were also generated.  COMPARISON:  12/31/2013  FINDINGS: CT HEAD FINDINGS  The bony calvarium is intact. Comminuted nasal bone fractures with impaction are seen. Considerable soft tissue swelling is noted. Diffuse atrophic changes are noted. No findings to suggest acute hemorrhage, acute infarction or space-occupying mass lesion are noted.  CT MAXILLOFACIAL FINDINGS  Comminuted nasal bone fractures are again identified similar to that seen on the CT of the head. No other definitive fractures are seen. Paranasal sinuses are well aerated. Degenerative facet changes of the cervical spine are seen. Diffuse carotid calcifications are noted.  IMPRESSION: CT of the head: Comminuted nasal bone fractures.  Atrophic changes.  No acute intracranial abnormality noted.  CT of the maxillofacial bones: Comminuted nasal bone fractures. No other acute fracture is seen.   Electronically Signed   By: Inez Catalina M.D.   On: 09/06/2014 10:52   Dg Knee Complete 4 Views Right  09/06/2014   CLINICAL DATA:  Fall.  Laceration of the anterior RIGHT knee.  EXAM: RIGHT KNEE - COMPLETE 4+ VIEW  COMPARISON:  None.  FINDINGS: Severe tricompartmental osteoarthritis is present with chondrocalcinosis of the menisci. Chondrocalcinosis is probably senile and degenerative. Dense atherosclerosis is present. No effusion. Mild soft tissue swelling anterior to the patellar tendon. No gas is identified in the joint to suggest violation of the joint capsule. No fracture.  IMPRESSION: No acute osseous abnormality. Mild soft tissue swelling anterior to the patellar tendon.   Electronically Signed   By: Dereck Ligas M.D.   On: 09/06/2014 11:41   Ct Maxillofacial Wo Cm  09/06/2014   CLINICAL DATA:  Weakness and recent fall  EXAM: CT HEAD WITHOUT CONTRAST  CT MAXILLOFACIAL WITHOUT CONTRAST  TECHNIQUE: Multidetector CT imaging of the head and maxillofacial structures were performed using the standard protocol  without intravenous contrast. Multiplanar CT image reconstructions of the maxillofacial structures were also generated.  COMPARISON:  12/31/2013  FINDINGS: CT HEAD FINDINGS  The bony calvarium is intact. Comminuted nasal bone fractures with impaction are seen. Considerable soft tissue swelling is noted. Diffuse atrophic changes are noted. No findings to suggest acute hemorrhage, acute infarction or space-occupying mass lesion are noted.  CT MAXILLOFACIAL FINDINGS  Comminuted nasal  bone fractures are again identified similar to that seen on the CT of the head. No other definitive fractures are seen. Paranasal sinuses are well aerated. Degenerative facet changes of the cervical spine are seen. Diffuse carotid calcifications are noted.  IMPRESSION: CT of the head: Comminuted nasal bone fractures.  Atrophic changes.  No acute intracranial abnormality noted.  CT of the maxillofacial bones: Comminuted nasal bone fractures. No other acute fracture is seen.   Electronically Signed   By: Inez Catalina M.D.   On: 09/06/2014 10:52       Date: 09/06/2014  Rate: 45Rhythm: sinus bradycardia  QRS Axis: left  Intervals: normal  ST/T Wave abnormalities: nonspecific ST/T changes  Conduction Disutrbances:left bundle branch block  Narrative Interpretation:   Old EKG Reviewed: changes noted - no acute change x rate slower    MDM  Iv ns. Monitor.   Cts. Xr.   Wounds cleaned, moist sterile dressing.   Tetanus is up to date within past 5 years.  Reviewed nursing notes and prior charts for additional history.   Daughter notes metoprolol dose had been increased not too long ago, and that pt has follow up w her cardiologist later this week.  Given bradycardia, although no faintness/dizziness, sob or cp, will recommend decreasing metoprolol until cardiology follow up later this week.    Regarding hx frequent falls, daughter states pt has walker and cane, but frequently does not use either.  I encouraged pt to use  walker, discussed fall precautions.  Wounds sutures per PA Rona Ravens. Pt requests ent f/u re nasal fracture. Discussed plan ice/elevation, and follow up in 1-2 weeks if any persistent concern.   Spine nt. abd soft nt.  No other focal pain or bony tenderness.  Pt appears stable for d/c.     Mirna Mires, MD 09/06/14 1216

## 2014-09-06 NOTE — ED Notes (Signed)
Pt is from [facility]

## 2014-09-06 NOTE — ED Notes (Signed)
Per EMS- pt tripped today while walking into church. Pt noted to have abrasions and bruising to nose and cheeks. Pt also has abrasions to bilateral hands and rt knee. Pt denies LOC, head, neck or back pain. State that she just hurst where the abrasions are.

## 2014-09-07 ENCOUNTER — Ambulatory Visit: Payer: Medicare Other | Admitting: Cardiology

## 2014-09-08 ENCOUNTER — Telehealth: Payer: Self-pay | Admitting: Cardiology

## 2014-09-08 ENCOUNTER — Encounter (HOSPITAL_COMMUNITY): Payer: Self-pay | Admitting: Emergency Medicine

## 2014-09-08 ENCOUNTER — Emergency Department (HOSPITAL_COMMUNITY)
Admission: EM | Admit: 2014-09-08 | Discharge: 2014-09-08 | Disposition: A | Discharge status: Home / Self Care | Payer: Medicare Other | Attending: Emergency Medicine | Admitting: Emergency Medicine

## 2014-09-08 DIAGNOSIS — Z8669 Personal history of other diseases of the nervous system and sense organs: Secondary | ICD-10-CM | POA: Insufficient documentation

## 2014-09-08 DIAGNOSIS — Z87828 Personal history of other (healed) physical injury and trauma: Secondary | ICD-10-CM | POA: Insufficient documentation

## 2014-09-08 DIAGNOSIS — K219 Gastro-esophageal reflux disease without esophagitis: Secondary | ICD-10-CM | POA: Diagnosis not present

## 2014-09-08 DIAGNOSIS — I2510 Atherosclerotic heart disease of native coronary artery without angina pectoris: Secondary | ICD-10-CM | POA: Diagnosis not present

## 2014-09-08 DIAGNOSIS — Z85828 Personal history of other malignant neoplasm of skin: Secondary | ICD-10-CM | POA: Diagnosis not present

## 2014-09-08 DIAGNOSIS — Z79899 Other long term (current) drug therapy: Secondary | ICD-10-CM | POA: Diagnosis not present

## 2014-09-08 DIAGNOSIS — Z8781 Personal history of (healed) traumatic fracture: Secondary | ICD-10-CM | POA: Diagnosis not present

## 2014-09-08 DIAGNOSIS — I10 Essential (primary) hypertension: Secondary | ICD-10-CM | POA: Diagnosis not present

## 2014-09-08 DIAGNOSIS — Z8739 Personal history of other diseases of the musculoskeletal system and connective tissue: Secondary | ICD-10-CM | POA: Insufficient documentation

## 2014-09-08 DIAGNOSIS — Z8701 Personal history of pneumonia (recurrent): Secondary | ICD-10-CM | POA: Insufficient documentation

## 2014-09-08 DIAGNOSIS — I495 Sick sinus syndrome: Secondary | ICD-10-CM

## 2014-09-08 DIAGNOSIS — R000 Tachycardia, unspecified: Secondary | ICD-10-CM | POA: Diagnosis not present

## 2014-09-08 DIAGNOSIS — H409 Unspecified glaucoma: Secondary | ICD-10-CM | POA: Diagnosis not present

## 2014-09-08 DIAGNOSIS — R42 Dizziness and giddiness: Secondary | ICD-10-CM | POA: Insufficient documentation

## 2014-09-08 DIAGNOSIS — Z9181 History of falling: Secondary | ICD-10-CM | POA: Insufficient documentation

## 2014-09-08 DIAGNOSIS — E039 Hypothyroidism, unspecified: Secondary | ICD-10-CM | POA: Insufficient documentation

## 2014-09-08 DIAGNOSIS — E785 Hyperlipidemia, unspecified: Secondary | ICD-10-CM | POA: Insufficient documentation

## 2014-09-08 DIAGNOSIS — Z7982 Long term (current) use of aspirin: Secondary | ICD-10-CM | POA: Insufficient documentation

## 2014-09-08 DIAGNOSIS — I499 Cardiac arrhythmia, unspecified: Secondary | ICD-10-CM | POA: Insufficient documentation

## 2014-09-08 LAB — URINALYSIS, ROUTINE W REFLEX MICROSCOPIC
BILIRUBIN URINE: NEGATIVE
Glucose, UA: NEGATIVE mg/dL
Hgb urine dipstick: NEGATIVE
KETONES UR: NEGATIVE mg/dL
Nitrite: NEGATIVE
PH: 5.5 (ref 5.0–8.0)
Protein, ur: NEGATIVE mg/dL
SPECIFIC GRAVITY, URINE: 1.021 (ref 1.005–1.030)
UROBILINOGEN UA: 0.2 mg/dL (ref 0.0–1.0)

## 2014-09-08 LAB — CBC WITH DIFFERENTIAL/PLATELET
BASOS ABS: 0 10*3/uL (ref 0.0–0.1)
Basophils Relative: 0 % (ref 0–1)
Eosinophils Absolute: 0 10*3/uL (ref 0.0–0.7)
Eosinophils Relative: 0 % (ref 0–5)
HCT: 42.4 % (ref 36.0–46.0)
Hemoglobin: 14 g/dL (ref 12.0–15.0)
LYMPHS ABS: 1.6 10*3/uL (ref 0.7–4.0)
LYMPHS PCT: 16 % (ref 12–46)
MCH: 32.6 pg (ref 26.0–34.0)
MCHC: 33 g/dL (ref 30.0–36.0)
MCV: 98.6 fL (ref 78.0–100.0)
Monocytes Absolute: 1.1 10*3/uL — ABNORMAL HIGH (ref 0.1–1.0)
Monocytes Relative: 11 % (ref 3–12)
NEUTROS ABS: 7.6 10*3/uL (ref 1.7–7.7)
NEUTROS PCT: 73 % (ref 43–77)
PLATELETS: 182 10*3/uL (ref 150–400)
RBC: 4.3 MIL/uL (ref 3.87–5.11)
RDW: 13.4 % (ref 11.5–15.5)
WBC: 10.4 10*3/uL (ref 4.0–10.5)

## 2014-09-08 LAB — BASIC METABOLIC PANEL
ANION GAP: 11 (ref 5–15)
BUN: 21 mg/dL (ref 6–23)
CALCIUM: 8.7 mg/dL (ref 8.4–10.5)
CO2: 25 meq/L (ref 19–32)
Chloride: 103 mEq/L (ref 96–112)
Creatinine, Ser: 1.04 mg/dL (ref 0.50–1.10)
GFR calc Af Amer: 52 mL/min — ABNORMAL LOW (ref >90)
GFR calc non Af Amer: 45 mL/min — ABNORMAL LOW (ref >90)
GLUCOSE: 125 mg/dL — AB (ref 70–99)
POTASSIUM: 4.1 meq/L (ref 3.7–5.3)
Sodium: 139 mEq/L (ref 137–147)

## 2014-09-08 LAB — URINE MICROSCOPIC-ADD ON

## 2014-09-08 LAB — TROPONIN I

## 2014-09-08 NOTE — ED Notes (Signed)
[facility] [phone], [name], RN updated on pt status to this point

## 2014-09-08 NOTE — H&P (Signed)
Cardiologist:  Hochrein  Reason for Consult:  TAchycardia Referring Physician:   Renetta Suman is an 80 y.o. female.  HPI:   The patient is a 80 yo female, who still drives, with a history of SVT, HTN, bradycardia, CAD, GI bleeding, hypothyroidism.   On July 16 she had a syncopal episode at her hairdressers.  She was in sinus in the ER but then developed SVT at 131bpm.  The patient had no recollection of the syncope.  Enzymes were negative. She saw Cecilie Kicks, NP, in the office on 08/03/14.  Between the ER visit in July, and the office visit, she reports recurrent tachycardia and episodes of labile BP.  Her blood pressures have ranged from 85/57 with a pulse of 129 to a blood pressure of 153/88 with a pulse of 77 she's also had blood pressure is 95/53 with a pulse of 52.  She lives at [place] and caregiver comes twice a day to give medications and check her blood pressure.   In May she wore an event monitor that revealed sinus rhythm with PACs.  Her other history includes cardiac catheterization in 2010 due to chest pain associated with SVT. She had one vessel obstructive coronary disease of the LAD with borderline disease of the left circumflex.  EF was normal at 60%. Her enzymes did bump on that admission as well. Dr. Denman George attempted PCI but was unsuccessful. She has been treated medically.  She also has carotid disease with right internal carotid artery 40-59% stenosis and left internal carotid artery 1-39% stenosis.   She was seen in the ER on 9/4 after falling at 0200hrs.  She complained of weakness, dizziness at the time. EMS reported she was orthostatic at the time.  She was seen again in the ER on 09/06/14 after tripping and falling while going to church.  At that time she denied at dizziness or syncope.  She did suffer lacerations to nose, right knee and fractured nose.  EKG at the time was sinus brady at 45bpm.  Metoprolol was decreased to lopressor 25 mg bid from TID.    She  presents today from the nursing home with fast irregular HR and hypotension.  EMS strips are not available but they indicated she had a tachy irreg rhythm.   Office telephone note says rate was 135 to 144.  HR dropped to 70-80 after IV insertion.  She reeived 250cc bolus of NS.  EKG shows LBBB and a rate of 58 BPM.  She reports not feeling well this morning but could not elaborate.  The patient currently denies nausea, vomiting, fever, chest pain, shortness of breath, orthopnea, dizziness, PND, cough, congestion, abdominal pain, hematochezia, melena, lower extremity edema, claudication.    Medications: Prior to Admission medications   Medication Sig Start Date End Date Taking? Authorizing Provider  aspirin EC 81 MG tablet Take 81 mg by mouth every evening.    Historical Provider, MD  atorvastatin (LIPITOR) 20 MG tablet Take 20 mg by mouth daily.    Historical Provider, MD  brimonidine (ALPHAGAN) 0.2 % ophthalmic solution Place 1 drop into both eyes 2 (two) times daily.    Historical Provider, MD  diphenhydrAMINE (SOMINEX) 25 MG tablet Take 25 mg by mouth at bedtime.    Historical Provider, MD  ezetimibe (ZETIA) 10 MG tablet Take 10 mg by mouth daily.    Historical Provider, MD  imipramine (TOFRANIL) 25 MG tablet Take 50 mg by mouth at bedtime. To reduce urinary frequency  Historical Provider, MD  latanoprost (XALATAN) 0.005 % ophthalmic solution Place 1 drop into both eyes every evening. UAD    Historical Provider, MD  levothyroxine (SYNTHROID, LEVOTHROID) 50 MCG tablet Take 50 mcg by mouth daily before breakfast.    Historical Provider, MD  metoprolol tartrate (LOPRESSOR) 25 MG tablet Take 1 tablet (25 mg total) by mouth 2 (two) times daily. 09/06/14   Mirna Mires, MD  Multiple Vitamin (MULTIVITAMIN WITH MINERALS) TABS Take 1 tablet by mouth daily. Certavite-A    Historical Provider, MD  omeprazole (PRILOSEC) 40 MG capsule Take 40 mg by mouth daily.    Historical Provider, MD    Past Medical  History  Diagnosis Date  . SVT (supraventricular tachycardia)     Diagnosed 2010  . HTN (hypertension)   . Dyslipidemia   . CAD (coronary artery disease)     a. NSTEMI 2010 in setting of SVT, with cath with unsuccessful PCI - chronic total occlusion of LAD, R->L collaterals.  . GI bleeding   . Unspecified hypothyroidism   . Unspecified hereditary and idiopathic peripheral neuropathy   . Pneumonia, organism unspecified   . Basal cell carcinoma of skin of trunk, except scrotum   . Unspecified malignant neoplasm of skin of lower limb, including hip   . Basal cell carcinoma of skin of lower limb, including hip   . Unspecified glaucoma     left eye  . Other premature beats   . Other esophagitis   . Esophageal reflux   . Osteoarthrosis, unspecified whether generalized or localized, unspecified site     multiple joints  . Pain in joint, shoulder region   . Pain in joint, lower leg     knee  . Pain in joint, ankle and foot     right  . Sacroiliitis, not elsewhere classified   . Cervicalgia   . Lumbago   . Ganglion of tendon sheath   . Pathologic fracture of vertebrae   . Tietze's disease   . Insomnia, unspecified   . Disturbance of skin sensation   . Edema   . Palpitations   . Other nonspecific abnormal serum enzyme levels   . Nasal bones, closed fracture   . Closed fracture of unspecified trochanteric section of femur   . Open wound of knee, leg (except thigh), and ankle, without mention of complication   . Hip, thigh, leg, and ankle, abrasion or friction burn, without mention of infection   . Contusion of hip   . Fall from other slipping, tripping, or stumbling   . Personal history of fall   . Dyspepsia and other specified disorders of function of stomach   . Paroxysmal supraventricular tachycardia   . Acute bronchiolitis due to other infectious organisms 03/02/2013  . Other abnormal blood chemistry 03/15/2013  . Contusion of rib on left side 08/03/2013    Past Surgical  History  Procedure Laterality Date  . Hip surgery  2001  . Breast lumpectomy  1995    right breast, negative for malignancy  . Cataract extraction  2003    bilateral  . Leg skin lesion  biopsy / excision Left 08/22/2010    Lower leg shave biopsy -solar keratosis (Dr. Radford Pax)    Family History  Problem Relation Age of Onset  . Stroke Neg Hx   . Heart attack Neg Hx   . Heart disease Father   . Cancer Brother     lung    Social History:  reports that she has  never smoked. She has never used smokeless tobacco. She reports that she drinks alcohol. She reports that she does not use illicit drugs.  Allergies: No Known Allergies   Results for orders placed during the hospital encounter of 09/08/14 (from the past 48 hour(s))  CBC WITH DIFFERENTIAL     Status: Abnormal   Collection Time    09/08/14 12:30 PM      Result Value Ref Range   WBC 10.4  4.0 - 10.5 K/uL   RBC 4.30  3.87 - 5.11 MIL/uL   Hemoglobin 14.0  12.0 - 15.0 g/dL   HCT 42.4  36.0 - 46.0 %   MCV 98.6  78.0 - 100.0 fL   MCH 32.6  26.0 - 34.0 pg   MCHC 33.0  30.0 - 36.0 g/dL   RDW 13.4  11.5 - 15.5 %   Platelets 182  150 - 400 K/uL   Neutrophils Relative % 73  43 - 77 %   Neutro Abs 7.6  1.7 - 7.7 K/uL   Lymphocytes Relative 16  12 - 46 %   Lymphs Abs 1.6  0.7 - 4.0 K/uL   Monocytes Relative 11  3 - 12 %   Monocytes Absolute 1.1 (*) 0.1 - 1.0 K/uL   Eosinophils Relative 0  0 - 5 %   Eosinophils Absolute 0.0  0.0 - 0.7 K/uL   Basophils Relative 0  0 - 1 %   Basophils Absolute 0.0  0.0 - 0.1 K/uL  BASIC METABOLIC PANEL     Status: Abnormal   Collection Time    09/08/14 12:30 PM      Result Value Ref Range   Sodium 139  137 - 147 mEq/L   Potassium 4.1  3.7 - 5.3 mEq/L   Chloride 103  96 - 112 mEq/L   CO2 25  19 - 32 mEq/L   Glucose, Bld 125 (*) 70 - 99 mg/dL   BUN 21  6 - 23 mg/dL   Creatinine, Ser 1.04  0.50 - 1.10 mg/dL   Calcium 8.7  8.4 - 10.5 mg/dL   GFR calc non Af Amer 45 (*) >90 mL/min   GFR calc  Af Amer 52 (*) >90 mL/min   Comment: (NOTE)     The eGFR has been calculated using the CKD EPI equation.     This calculation has not been validated in all clinical situations.     eGFR's persistently <90 mL/min signify possible Chronic Kidney     Disease.   Anion gap 11  5 - 15  TROPONIN I     Status: None   Collection Time    09/08/14 12:30 PM      Result Value Ref Range   Troponin I <0.30  <0.30 ng/mL   Comment:            Due to the release kinetics of cTnI,     a negative result within the first hours     of the onset of symptoms does not rule out     myocardial infarction with certainty.     If myocardial infarction is still suspected,     repeat the test at appropriate intervals.  URINALYSIS, ROUTINE W REFLEX MICROSCOPIC     Status: Abnormal   Collection Time    09/08/14  1:27 PM      Result Value Ref Range   Color, Urine AMBER (*) YELLOW   Comment: BIOCHEMICALS MAY BE AFFECTED BY COLOR   APPearance CLOUDY (*)  CLEAR   Specific Gravity, Urine 1.021  1.005 - 1.030   pH 5.5  5.0 - 8.0   Glucose, UA NEGATIVE  NEGATIVE mg/dL   Hgb urine dipstick NEGATIVE  NEGATIVE   Bilirubin Urine NEGATIVE  NEGATIVE   Ketones, ur NEGATIVE  NEGATIVE mg/dL   Protein, ur NEGATIVE  NEGATIVE mg/dL   Urobilinogen, UA 0.2  0.0 - 1.0 mg/dL   Nitrite NEGATIVE  NEGATIVE   Leukocytes, UA MODERATE (*) NEGATIVE  URINE MICROSCOPIC-ADD ON     Status: Abnormal   Collection Time    09/08/14  1:27 PM      Result Value Ref Range   Squamous Epithelial / LPF RARE  RARE   WBC, UA 7-10  <3 WBC/hpf   Bacteria, UA RARE  RARE   Casts HYALINE CASTS (*) NEGATIVE    No results found.  Review of Systems  All other systems reviewed and are negative. The patient currently denies nausea, vomiting, fever, chest pain, shortness of breath, orthopnea, dizziness, PND, cough, congestion, abdominal pain, hematochezia, melena, lower extremity edema, claudication.    Blood pressure 161/71, pulse 55, temperature 97.6  F (36.4 C), temperature source Oral, resp. rate 18, height 5' 6" (1.676 m), weight 148 lb (67.132 kg), SpO2 100.00%. Physical Exam  Nursing note and vitals reviewed. Constitutional: She is oriented to person, place, and time. She appears well-developed.  HENT:  Head: Normocephalic.  Mouth/Throat: No oropharyngeal exudate.  Large area of ecchymosis on both cheeks,  Laceration to bridge of nose.   Eyes: EOM are normal. Pupils are equal, round, and reactive to light. No scleral icterus.  Neck: Normal range of motion. Neck supple. No JVD present.  Cardiovascular: Normal rate, regular rhythm, S1 normal and S2 normal.   No murmur heard. Pulses:      Radial pulses are 2+ on the right side, and 2+ on the left side.       Dorsalis pedis pulses are 2+ on the right side, and 2+ on the left side.  No carotid bruits  Respiratory: Effort normal and breath sounds normal. She has no wheezes. She has no rales.  GI: Soft. Bowel sounds are normal. She exhibits no distension. There is no tenderness.  Musculoskeletal: She exhibits no edema.  Neurological: She is alert and oriented to person, place, and time. She exhibits normal muscle tone.  Skin: Skin is warm and dry.  Psychiatric: She has a normal mood and affect.    Assessment/Plan: Tachy/Brady syndrome PSVT Recent falls, mechanical CAD-No angina HTN Dyslipidemia Hypotension CKD III  Plan:   The patient appears back to baseline.  She is very active and still drives.  I told her that she should not drive especially since she had a syncopal episode in July 2015.  She may have been in SVT as this is a frequent occurrence over the last few years requiring treatment with adenosine.  No history of afib.  BP improved after HR decreased and NS bolus.  She is now hypertensive.  Continue to monitor.  BP has been labile the last several months.   HR as low as 45 on telemetry.  We can not increase her beta blocker any.   Labs stable.  Consider PPM.  The  procedure was discussed with her today and she has a better understanding of what it entails.  She says she would like to discuss with her husband.  She was encourage to keep her appointment with Dr. Percival Spanish tomorrow.    HAGER, BRYAN,  PAC 09/08/2014, 2:24 PM  Patient seen and examined. I agree with the assessment and plan as detailed above. See also my additional thoughts below.   I had a long talk with the patient and her daughter. The patient is very independent and stubborn about her ongoing desire to continue to drive. I have told the patient that I would recommend not driving. This is particularly true without a pacemaker. The patient appears to have significant tachybradycardia. Today's episode was tachycardia. However in the emergency room she had times when her heart rate was 40. In the past she has refused consideration of a pacemaker. I explained the procedure to her carefully today. She and her daughter seemed surprised that this was not as big a an operation as a heart valve (one of the patient's friends had done poorly after heart valve surgery in the past) . With this in mind the patient may consider pacemaker now if it is recommended. She said she had to talk first with her husband who is over 76 years of age. I urged the patient and her daughter to keep the appointment tomorrow with Dr. Percival Spanish so that this discussion could be continued. The patient insisted on going home from the emergency room today.  Dola Argyle, MD, [facility] 09/08/2014 4:21 PM

## 2014-09-08 NOTE — Telephone Encounter (Signed)
Nurse called from [facility] stated that pt.s heart rate was 135 to 144 and BP was palpable at 78, I spoke to Dr. Sallyanne Kuster whom is the DOD today and received orders to send pt. To the Emergency Dept. Via EMS, orders given to susan at [facility]

## 2014-09-08 NOTE — Discharge Instructions (Signed)
Nonspecific Tachycardia  Tachycardia is a faster than normal heartbeat (more than 100 beats per minute). In adults, the heart normally beats between 60 and 100 times a minute. A fast heartbeat may be a normal response to exercise or stress. It does not necessarily mean that something is wrong. However, sometimes when your heart beats too fast it may not be able to pump enough blood to the rest of your body. This can result in chest pain, shortness of breath, dizziness, and even fainting. Nonspecific tachycardia means that the specific cause or pattern of your tachycardia is unknown.  CAUSES   Tachycardia may be harmless or it may be due to a more serious underlying cause. Possible causes of tachycardia include:  · Exercise or exertion.  · Fever.  · Pain or injury.  · Infection.  · Loss of body fluids (dehydration).  · Overactive thyroid.  · Lack of red blood cells (anemia).  · Anxiety and stress.  · Alcohol.  · Caffeine.  · Tobacco products.  · Diet pills.  · Illegal drugs.  · Heart disease.  SYMPTOMS  · Rapid or irregular heartbeat (palpitations).  · Suddenly feeling your heart beating (cardiac awareness).  · Dizziness.  · Tiredness (fatigue).  · Shortness of breath.  · Chest pain.  · Nausea.  · Fainting.  DIAGNOSIS   Your caregiver will perform a physical exam and take your medical history. In some cases, a heart specialist (cardiologist) may be consulted. Your caregiver may also order:  · Blood tests.  · Electrocardiography. This test records the electrical activity of your heart.  · A heart monitoring test.  TREATMENT   Treatment will depend on the likely cause of your tachycardia. The goal is to treat the underlying cause of your tachycardia. Treatment methods may include:  · Replacement of fluids or blood through an intravenous (IV) tube for moderate to severe dehydration or anemia.  · New medicines or changes in your current medicines.  · Diet and lifestyle changes.  · Treatment for certain  infections.  · Stress relief or relaxation methods.  HOME CARE INSTRUCTIONS   · Rest.  · Drink enough fluids to keep your urine clear or pale yellow.  · Do not smoke.  · Avoid:  ¨ Caffeine.  ¨ Tobacco.  ¨ Alcohol.  ¨ Chocolate.  ¨ Stimulants such as over-the-counter diet pills or pills that help you stay awake.  ¨ Situations that cause anxiety or stress.  ¨ Illegal drugs such as marijuana, phencyclidine (PCP), and cocaine.  · Only take medicine as directed by your caregiver.  · Keep all follow-up appointments as directed by your caregiver.  SEEK IMMEDIATE MEDICAL CARE IF:   · You have pain in your chest, upper arms, jaw, or neck.  · You become weak, dizzy, or feel faint.  · You have palpitations that will not go away.  · You vomit, have diarrhea, or pass blood in your stool.  · Your skin is cool, pale, and wet.  · You have a fever that will not go away with rest, fluids, and medicine.  MAKE SURE YOU:   · Understand these instructions.  · Will watch your condition.  · Will get help right away if you are not doing well or get worse.  Document Released: 01/23/2005 Document Revised: 03/09/2012 Document Reviewed: 11/26/2011  [employer]® Patient Information ©2015 [employer]. This information is not intended to replace advice given to you by your health care provider. Make sure you discuss any questions   you have with your health care provider.

## 2014-09-08 NOTE — ED Notes (Signed)
Pt in from [facility] via [facility], per report pts HR 148 when facility obtained vitals, per EMS pt was in fast rate afib with no hx of a fib, pt HR decreased to 70-80 irregular with IV insertion, pt initially hypotensive with sys 85, pt rcvd 250 mL NS & BP increased, pt had recent fall & has bandage to R knee & has bruising to the face with nose fracture, pt A&O x4, with hx of short term memory problems, denies CP, SOB, N/V/D

## 2014-09-08 NOTE — ED Provider Notes (Signed)
CSN: [id number]     Arrival date & time 09/08/14  1113 History   First MD Initiated Contact with Patient 09/08/14 1145     Chief Complaint  Patient presents with  . Irregular Heart Beat     (Consider location/radiation/quality/duration/timing/severity/associated sxs/prior Treatment) Patient is a 80 y.o. female presenting with dizziness.  Dizziness Quality:  Lightheadedness Severity:  Moderate Onset quality:  Sudden Duration: brief. Timing:  Constant Progression:  Resolved Chronicity:  Recurrent Context comment:  Pt reports not sleeping well last night Relieved by: IV start. Worsened by:  Nothing tried Associated symptoms: no chest pain, no nausea and no shortness of breath     Past Medical History  Diagnosis Date  . SVT (supraventricular tachycardia)     Diagnosed 2010  . HTN (hypertension)   . Dyslipidemia   . CAD (coronary artery disease)     a. NSTEMI 2010 in setting of SVT, with cath with unsuccessful PCI - chronic total occlusion of LAD, R->L collaterals.  . GI bleeding   . Unspecified hypothyroidism   . Unspecified hereditary and idiopathic peripheral neuropathy   . Pneumonia, organism unspecified   . Basal cell carcinoma of skin of trunk, except scrotum   . Unspecified malignant neoplasm of skin of lower limb, including hip   . Basal cell carcinoma of skin of lower limb, including hip   . Unspecified glaucoma     left eye  . Other premature beats   . Other esophagitis   . Esophageal reflux   . Osteoarthrosis, unspecified whether generalized or localized, unspecified site     multiple joints  . Pain in joint, shoulder region   . Pain in joint, lower leg     knee  . Pain in joint, ankle and foot     right  . Sacroiliitis, not elsewhere classified   . Cervicalgia   . Lumbago   . Ganglion of tendon sheath   . Pathologic fracture of vertebrae   . Tietze's disease   . Insomnia, unspecified   . Disturbance of skin sensation   . Edema   . Palpitations   .  Other nonspecific abnormal serum enzyme levels   . Nasal bones, closed fracture   . Closed fracture of unspecified trochanteric section of femur   . Open wound of knee, leg (except thigh), and ankle, without mention of complication   . Hip, thigh, leg, and ankle, abrasion or friction burn, without mention of infection   . Contusion of hip   . Fall from other slipping, tripping, or stumbling   . Personal history of fall   . Dyspepsia and other specified disorders of function of stomach   . Paroxysmal supraventricular tachycardia   . Acute bronchiolitis due to other infectious organisms 03/02/2013  . Other abnormal blood chemistry 03/15/2013  . Contusion of rib on left side 08/03/2013   Past Surgical History  Procedure Laterality Date  . Hip surgery  2001  . Breast lumpectomy  1995    right breast, negative for malignancy  . Cataract extraction  2003    bilateral  . Leg skin lesion  biopsy / excision Left 08/22/2010    Lower leg shave biopsy -solar keratosis (Dr. Radford Pax)   Family History  Problem Relation Age of Onset  . Stroke Neg Hx   . Heart attack Neg Hx   . Heart disease Father   . Cancer Brother     lung   History  Substance Use Topics  . Smoking status: Never  Smoker   . Smokeless tobacco: Never Used     Comment: denies smoking cigarettes  . Alcohol Use: Yes     Comment: drinks a scotch or wine every other day    OB History   Grav Para Term Preterm Abortions TAB SAB Ect Mult Living                 Review of Systems  Respiratory: Negative for shortness of breath.   Cardiovascular: Negative for chest pain.  Gastrointestinal: Negative for nausea.  Neurological: Positive for dizziness.  All other systems reviewed and are negative.     Allergies  Review of patient's allergies indicates no known allergies.  Home Medications   Prior to Admission medications   Medication Sig Start Date End Date Taking? Authorizing Provider  aspirin EC 81 MG tablet Take 81 mg by  mouth every evening.    Historical Provider, MD  atorvastatin (LIPITOR) 20 MG tablet Take 20 mg by mouth daily.    Historical Provider, MD  brimonidine (ALPHAGAN) 0.2 % ophthalmic solution Place 1 drop into both eyes 2 (two) times daily.    Historical Provider, MD  diphenhydrAMINE (SOMINEX) 25 MG tablet Take 25 mg by mouth at bedtime.    Historical Provider, MD  ezetimibe (ZETIA) 10 MG tablet Take 10 mg by mouth daily.    Historical Provider, MD  imipramine (TOFRANIL) 25 MG tablet Take 50 mg by mouth at bedtime. To reduce urinary frequency    Historical Provider, MD  latanoprost (XALATAN) 0.005 % ophthalmic solution Place 1 drop into both eyes every evening. UAD    Historical Provider, MD  levothyroxine (SYNTHROID, LEVOTHROID) 50 MCG tablet Take 50 mcg by mouth daily before breakfast.    Historical Provider, MD  metoprolol tartrate (LOPRESSOR) 25 MG tablet Take 1 tablet (25 mg total) by mouth 2 (two) times daily. 09/06/14   Mirna Mires, MD  Multiple Vitamin (MULTIVITAMIN WITH MINERALS) TABS Take 1 tablet by mouth daily. Certavite-A    Historical Provider, MD  omeprazole (PRILOSEC) 40 MG capsule Take 40 mg by mouth daily.    Historical Provider, MD   BP 137/63  Pulse 53  Temp(Src) 97.6 F (36.4 C) (Oral)  Resp 15  Ht 5\' 6"  (1.676 m)  Wt 148 lb (67.132 kg)  BMI 23.90 kg/m2  SpO2 100% Physical Exam  Nursing note and vitals reviewed. Constitutional: She is oriented to person, place, and time. She appears well-developed and well-nourished. No distress.  HENT:  Head: Normocephalic.  Mouth/Throat: Oropharynx is clear and moist.  Bruise to nose and bilateral cheeks  Eyes: Conjunctivae are normal. Pupils are equal, round, and reactive to light. No scleral icterus.  Neck: Neck supple.  Cardiovascular: Regular rhythm, normal heart sounds and intact distal pulses.  Bradycardia present.   No murmur heard. Pulmonary/Chest: Effort normal and breath sounds normal. No stridor. No respiratory  distress. She has no rales.  Abdominal: Soft. Bowel sounds are normal. She exhibits no distension. There is no tenderness.  Musculoskeletal: Normal range of motion.  Neurological: She is alert and oriented to person, place, and time.  Skin: Skin is warm and dry. No rash noted.  Psychiatric: She has a normal mood and affect. Her behavior is normal.    ED Course  Procedures (including critical care time) Labs Review Labs Reviewed  CBC WITH DIFFERENTIAL - Abnormal; Notable for the following:    Monocytes Absolute 1.1 (*)    All other components within normal limits  BASIC METABOLIC PANEL -  Abnormal; Notable for the following:    Glucose, Bld 125 (*)    GFR calc non Af Amer 45 (*)    GFR calc Af Amer 52 (*)    All other components within normal limits  URINALYSIS, ROUTINE W REFLEX MICROSCOPIC - Abnormal; Notable for the following:    Color, Urine AMBER (*)    APPearance CLOUDY (*)    Leukocytes, UA MODERATE (*)    All other components within normal limits  URINE MICROSCOPIC-ADD ON - Abnormal; Notable for the following:    Casts HYALINE CASTS (*)    All other components within normal limits  TROPONIN I    Imaging Review No results found.   EKG Interpretation   Date/Time:  Thursday September 08 2014 11:17:15 EDT Ventricular Rate:  58 PR Interval:  207 QRS Duration: 127 QT Interval:  475 QTC Calculation: 467 R Axis:   6 Text Interpretation:  Sinus rhythm Atrial premature complex Left bundle  branch block No significant change was found Confirmed by [facility]  MD,  TREY ([id number]) on 09/08/2014 11:21:16 AM      MDM   Final diagnoses:  Tachycardia    80 yo female presenting from her nursing facility secondary to fast heart rate and hypotension.  Pt remembers feeling dizzy at the time, but does not think she had any other significant symptoms.  She feels well now.  She reports similar symptoms in the past.  Per EMS report, she had a tachy irregular rhythm on their monitor,  which improved after IV start.  She has a hx of paroxysmal SVT, but no apparent history of a fib.  Chart review shows that this is a recurrent problem for the patient.  4:10 PM labs were unremarkable. EKG and monitoring here has also been unremarkable. I asked cardiology to come and evaluate the patient in the ED. They felt she was stable for discharge home with close outpatient followup. They've set up an appointment with Dr. Percival Spanish tomorrow.  Houston Siren III, MD 09/08/14 [phone]

## 2014-09-09 ENCOUNTER — Encounter: Payer: Self-pay | Admitting: Cardiology

## 2014-09-09 ENCOUNTER — Non-Acute Institutional Stay (SKILLED_NURSING_FACILITY): Payer: Medicare Other | Admitting: Internal Medicine

## 2014-09-09 ENCOUNTER — Other Ambulatory Visit: Payer: Self-pay | Admitting: *Deleted

## 2014-09-09 ENCOUNTER — Other Ambulatory Visit: Payer: Self-pay | Admitting: Cardiology

## 2014-09-09 ENCOUNTER — Ambulatory Visit (INDEPENDENT_AMBULATORY_CARE_PROVIDER_SITE_OTHER): Payer: Medicare Other | Admitting: Cardiology

## 2014-09-09 VITALS — BP 143/76 | HR 72 | Ht 66.0 in | Wt 138.6 lb

## 2014-09-09 DIAGNOSIS — IMO0001 Reserved for inherently not codable concepts without codable children: Secondary | ICD-10-CM

## 2014-09-09 DIAGNOSIS — I471 Supraventricular tachycardia: Secondary | ICD-10-CM

## 2014-09-09 DIAGNOSIS — K219 Gastro-esophageal reflux disease without esophagitis: Secondary | ICD-10-CM

## 2014-09-09 DIAGNOSIS — I10 Essential (primary) hypertension: Secondary | ICD-10-CM

## 2014-09-09 DIAGNOSIS — I959 Hypotension, unspecified: Secondary | ICD-10-CM

## 2014-09-09 DIAGNOSIS — E039 Hypothyroidism, unspecified: Secondary | ICD-10-CM

## 2014-09-09 DIAGNOSIS — I2510 Atherosclerotic heart disease of native coronary artery without angina pectoris: Secondary | ICD-10-CM

## 2014-09-09 DIAGNOSIS — I498 Other specified cardiac arrhythmias: Secondary | ICD-10-CM

## 2014-09-09 DIAGNOSIS — R2681 Unsteadiness on feet: Secondary | ICD-10-CM

## 2014-09-09 DIAGNOSIS — E785 Hyperlipidemia, unspecified: Secondary | ICD-10-CM

## 2014-09-09 DIAGNOSIS — R269 Unspecified abnormalities of gait and mobility: Secondary | ICD-10-CM

## 2014-09-09 MED ORDER — PINDOLOL 5 MG PO TABS: 5.0000 mg | ORAL_TABLET | Freq: Every day | ORAL | Status: DC

## 2014-09-09 NOTE — Progress Notes (Signed)
HPI The patient has a history of supraventricular tachycardia. She was in the ED yesterday after tachycardia.  SVT was not captured.  She was hypotensive and received fluid bolus.  Her BP responded to this.  She has had documented SVT and there is mention that she clearly had a syncopal episode related to this at one point. She's also had some bradycardia arrhythmias but has been no documented syncope related to the. Her blood pressures have has been labile. She's had at least one syncopal episode in July and then mechanical fall.  He feels palpitations. She doesn't describe overt orthostasis. Summary she was clearly related to be ineffective she doesn't use a walker. There's been no new chest pressure, neck or arm discomfort. Been no new shortness of breath, PND or orthopnea.  No Known Allergies  Current Outpatient Prescriptions  Medication Sig Dispense Refill  . acetaminophen (TYLENOL) 325 MG tablet Take 650 mg by mouth every 6 (six) hours as needed.      Marland Kitchen aspirin EC 81 MG tablet Take 81 mg by mouth every evening.      Marland Kitchen atorvastatin (LIPITOR) 20 MG tablet Take 20 mg by mouth daily.      . brimonidine (ALPHAGAN) 0.2 % ophthalmic solution Place 1 drop into both eyes 2 (two) times daily.      . diphenhydrAMINE (SOMINEX) 25 MG tablet Take 25 mg by mouth at bedtime.      Marland Kitchen ezetimibe (ZETIA) 10 MG tablet Take 10 mg by mouth daily.      Marland Kitchen imipramine (TOFRANIL) 25 MG tablet Take 50 mg by mouth at bedtime. To reduce urinary frequency      . latanoprost (XALATAN) 0.005 % ophthalmic solution Place 1 drop into both eyes every evening. UAD      . levothyroxine (SYNTHROID, LEVOTHROID) 50 MCG tablet Take 50 mcg by mouth daily before breakfast.      . metoprolol tartrate (LOPRESSOR) 25 MG tablet Take 1 tablet (25 mg total) by mouth 2 (two) times daily.  270 tablet  3  . Multiple Vitamin (MULTIVITAMIN WITH MINERALS) TABS Take 1 tablet by mouth daily. Certavite-A      . omeprazole (PRILOSEC) 40 MG capsule  Take 40 mg by mouth daily.       No current facility-administered medications for this visit.    Past Medical History  Diagnosis Date  . SVT (supraventricular tachycardia)     Diagnosed 2010  . HTN (hypertension)   . Dyslipidemia   . CAD (coronary artery disease)     a. NSTEMI 2010 in setting of SVT, with cath with unsuccessful PCI - chronic total occlusion of LAD, R->L collaterals.  . GI bleeding   . Unspecified hypothyroidism   . Unspecified hereditary and idiopathic peripheral neuropathy   . Pneumonia, organism unspecified   . Basal cell carcinoma of skin of trunk, except scrotum   . Unspecified malignant neoplasm of skin of lower limb, including hip   . Basal cell carcinoma of skin of lower limb, including hip   . Unspecified glaucoma     left eye  . Other premature beats   . Other esophagitis   . Esophageal reflux   . Osteoarthrosis, unspecified whether generalized or localized, unspecified site     multiple joints  . Pain in joint, shoulder region   . Pain in joint, lower leg     knee  . Pain in joint, ankle and foot     right  . Sacroiliitis, not elsewhere classified   .  Cervicalgia   . Lumbago   . Ganglion of tendon sheath   . Pathologic fracture of vertebrae   . Tietze's disease   . Insomnia, unspecified   . Disturbance of skin sensation   . Edema   . Palpitations   . Other nonspecific abnormal serum enzyme levels   . Nasal bones, closed fracture   . Closed fracture of unspecified trochanteric section of femur   . Open wound of knee, leg (except thigh), and ankle, without mention of complication   . Hip, thigh, leg, and ankle, abrasion or friction burn, without mention of infection   . Contusion of hip   . Fall from other slipping, tripping, or stumbling   . Personal history of fall   . Dyspepsia and other specified disorders of function of stomach   . Paroxysmal supraventricular tachycardia   . Acute bronchiolitis due to other infectious organisms  03/02/2013  . Other abnormal blood chemistry 03/15/2013  . Contusion of rib on left side 08/03/2013    ROS:  As stated in the HPI and negative for all other systems.  PHYSICAL EXAM BP 143/76  Pulse 72  Ht 5\' 6"  (1.676 m)  Wt 138 lb 9.6 oz (62.869 kg)  BMI 22.38 kg/m2 GEN:  No distress, frail appearing  NECK:  No jugular venous distention at 90 degrees, waveform within normal limits, carotid upstroke brisk and symmetric, no bruits, no thyromegaly LYMPHATICS:  No cervical adenopathy LUNGS:  Clear to auscultation bilaterally BACK:  No CVA tenderness CHEST:  Unremarkable HEART:  S1 and S2 within normal limits, no S3, no S4, no clicks, no rubs, soft apical systolic murmur, no diastolic murmurs ABD:  Positive bowel sounds normal in frequency in pitch, no bruits, no rebound, no guarding, unable to assess midline mass or bruit with the patient seated. EXT:  2 plus pulses throughout, moderate edema, no cyanosis no clubbing SKIN:  No rashes no nodules, bruising and wounds.   NEURO:  Cranial nerves II through XII grossly intact, motor grossly intact throughout PSYCH:  Cognitively intact, oriented to person place and time   ASSESSMENT AND PLAN  PSVT -  We did discuss the possibility of a pacemaker when she was in ER yesterday. I reviewed extensively these records. He would much like to avoid this this point I'm not sure this is absolutely indicated.  Will need a Deneane Stifter will need a 21 day event monitor.  The patients symptoms necessitate an event monitor.  The symptoms are too infrequent to be identified on a Holter monitor.    I will be trying to replace her beta blocker with pindolol as there is some evidence of tachybradycardia which may be contributing. They would consider pacing in the future we can clearly demonstrate that her syncope is related to SVT over the she's having a bradycardia arrhythmias related to SVT.  HYPERTENSION -  Her blood pressure has been labile. This will be  managed as above.  She is not to drive.   FALLS - We discussed precautions.

## 2014-09-09 NOTE — Addendum Note (Signed)
Addended by: Dolan Amen on: 09/09/2014 10:33 AM   Modules accepted: Orders

## 2014-09-09 NOTE — Patient Instructions (Signed)
Take pindolol 2.5 mg daily   Stop taking Metoprolol  Wear the 21 day event monitor as directed  Your physician recommends that you schedule a follow-up appointment in:  One month with Dr. Percival Spanish

## 2014-09-10 ENCOUNTER — Encounter: Payer: Self-pay | Admitting: Internal Medicine

## 2014-09-10 NOTE — Progress Notes (Signed)
[name], female   DOB: [birth year], 80 y.o.   MRN: [id number]     [facility]  PCP: Estill Dooms, MD  Code Status: DNR  No Known Allergies  Chief Complaint: NEW ADMIT  HPI:  80 y/o female patient is here for rehabilitation post ED visit on 09/06/14. She is a high fall risk patient, had a fall and was in the ED on 09/02/14. She suffered laceration in her right knee and fractured her nose. She had sutures placed.  She has history of HTN, CAD, GI bleed, SVT and has had labile bp and heart rate over past few years. She has been seen by cardiology today, notes reviewed She denies any complaints As per staff, pt tries to get out of bed by herself and remains a high fall risk   Review of Systems:  Constitutional: Negative for fever, chills, diaphoresis.  HENT: Negative for congestion, sore throat.   Eyes: Negative for eye pain, blurred vision, double vision and discharge.  Respiratory: Negative for cough, sputum production, shortness of breath Cardiovascular: Negative for chest pain, palpitations, leg swelling.  Gastrointestinal: Negative for heartburn, nausea, vomiting, abdominal pain.  Genitourinary: Negative for dysuria  Musculoskeletal: Negative for back pain Skin: Negative for itching and rash.  Neurological: Negative for dizziness and headaches. has generalized weakness Psychiatric/Behavioral: Negative for depression.     Past Medical History  Diagnosis Date  . SVT (supraventricular tachycardia)     Diagnosed 2010  . HTN (hypertension)   . Dyslipidemia   . CAD (coronary artery disease)     a. NSTEMI 2010 in setting of SVT, with cath with unsuccessful PCI - chronic total occlusion of LAD, R->L collaterals.  . GI bleeding   . Unspecified hypothyroidism   . Unspecified hereditary and idiopathic peripheral neuropathy   . Pneumonia, organism unspecified   . Basal cell carcinoma of skin of trunk, except scrotum   . Unspecified malignant neoplasm  of skin of lower limb, including hip   . Basal cell carcinoma of skin of lower limb, including hip   . Unspecified glaucoma     left eye  . Other premature beats   . Other esophagitis   . Esophageal reflux   . Osteoarthrosis, unspecified whether generalized or localized, unspecified site     multiple joints  . Pain in joint, shoulder region   . Pain in joint, lower leg     knee  . Pain in joint, ankle and foot     right  . Sacroiliitis, not elsewhere classified   . Cervicalgia   . Lumbago   . Ganglion of tendon sheath   . Pathologic fracture of vertebrae   . Tietze's disease   . Insomnia, unspecified   . Disturbance of skin sensation   . Edema   . Palpitations   . Other nonspecific abnormal serum enzyme levels   . Nasal bones, closed fracture   . Closed fracture of unspecified trochanteric section of femur   . Open wound of knee, leg (except thigh), and ankle, without mention of complication   . Hip, thigh, leg, and ankle, abrasion or friction burn, without mention of infection   . Contusion of hip   . Fall from other slipping, tripping, or stumbling   . Personal history of fall   . Dyspepsia and other specified disorders of function of stomach   . Paroxysmal supraventricular tachycardia   . Acute bronchiolitis due to other infectious organisms 03/02/2013  . Other abnormal blood  chemistry 03/15/2013  . Contusion of rib on left side 08/03/2013   Past Surgical History  Procedure Laterality Date  . Hip surgery  2001  . Breast lumpectomy  1995    right breast, negative for malignancy  . Cataract extraction  2003    bilateral  . Leg skin lesion  biopsy / excision Left 08/22/2010    Lower leg shave biopsy -solar keratosis (Dr. Radford Pax)   Social History:   reports that she has never smoked. She has never used smokeless tobacco. She reports that she drinks alcohol. She reports that she does not use illicit drugs.  Family History  Problem Relation Age of Onset  . Stroke Neg  Hx   . Heart attack Neg Hx   . Heart disease Father   . Cancer Brother     lung    Medications: Patient's Medications  New Prescriptions   No medications on file  Previous Medications   ACETAMINOPHEN (TYLENOL) 325 MG TABLET    Take 650 mg by mouth every 6 (six) hours as needed.   ASPIRIN EC 81 MG TABLET    Take 81 mg by mouth every evening.   ATORVASTATIN (LIPITOR) 20 MG TABLET    Take 20 mg by mouth daily.   BRIMONIDINE (ALPHAGAN) 0.2 % OPHTHALMIC SOLUTION    Place 1 drop into both eyes 2 (two) times daily.   DIPHENHYDRAMINE (SOMINEX) 25 MG TABLET    Take 25 mg by mouth at bedtime.   EZETIMIBE (ZETIA) 10 MG TABLET    Take 10 mg by mouth daily.   IMIPRAMINE (TOFRANIL) 25 MG TABLET    Take 50 mg by mouth at bedtime. To reduce urinary frequency   LATANOPROST (XALATAN) 0.005 % OPHTHALMIC SOLUTION    Place 1 drop into both eyes every evening. UAD   LEVOTHYROXINE (SYNTHROID, LEVOTHROID) 50 MCG TABLET    Take 50 mcg by mouth daily before breakfast.   MULTIPLE VITAMIN (MULTIVITAMIN WITH MINERALS) TABS    Take 1 tablet by mouth daily. Certavite-A   OMEPRAZOLE (PRILOSEC) 40 MG CAPSULE    Take 40 mg by mouth daily.   PINDOLOL (VISKEN) 5 MG TABLET    TAKE ONE-HALF TABLET BY MOUTH DAILY   PINDOLOL PO    Take 2.5 mg by mouth daily.  Modified Medications   No medications on file  Discontinued Medications   No medications on file     Physical Exam: Filed Vitals:   09/10/14 1039  BP: 131/89  Pulse: 87  Temp: 97.3 F (36.3 C)  Resp: 20  Height: 5\' 5"  (1.651 m)  Weight: 139 lb 9.6 oz (63.322 kg)  SpO2: 100%    General- elderly female in no acute distress, frail Eyes- PERRLA, EOMI, no pallor, no icterus, no discharge Neck- no lymphadenopathy Throat- moist mucus membrane Cardiovascular- normal s1,s2, no murmurs Respiratory- bilateral clear to auscultation Abdomen- bowel sounds present, soft, non tender Musculoskeletal- able to move all 4 extremities, trace leg edema, weakness  present Neurological- no focal deficit Skin- warm and dry, easy bruising, has bruise on face, nose, sutures on nose, right knee area sutures, dressing clean and dry, mild erythema presenr Psychiatry- alert and oriented    Labs reviewed: Basic Metabolic Panel:  Recent Labs  07/14/14 1507 09/02/14 1136 09/08/14 1230  NA 136* 141 139  K 5.0 4.4 4.1  CL 99 106 103  CO2 24 25 25   GLUCOSE 131* 104* 125*  BUN 32* 26* 21  CREATININE 1.19* 0.94 1.04  CALCIUM 9.4  9.0 8.7   Liver Function Tests:  Recent Labs  02/19/14 1232  AST 27  ALT 20  ALKPHOS 116  BILITOT 0.3  PROT 7.3  ALBUMIN 3.1*   No results found for this basename: LIPASE, AMYLASE,  in the last 8760 hours No results found for this basename: AMMONIA,  in the last 8760 hours CBC:  Recent Labs  05/24/14 1003 07/14/14 1507 09/02/14 1136 09/08/14 1230  WBC 7.6 9.8 6.4 10.4  NEUTROABS 5.9  --  4.3 7.6  HGB 12.8 14.2 14.6 14.0  HCT 39.9 42.9 43.0 42.4  MCV 101.3* 100.7* 97.7 98.6  PLT 180 212 173 182   Cardiac Enzymes:  Recent Labs  09/02/14 1136 09/02/14 1550 09/08/14 1230  CKTOTAL 112  --   --   TROPONINI <0.30 <0.30 <0.30   BNP: No components found with this basename: POCBNP,  CBG:  Recent Labs  11/15/13 1702 12/31/13 1053  GLUCAP 67* 109*    Assessment/Plan  Gait instability Will have patient work with PT/OT as tolerated to regain strength and restore function.  Fall precautions are in place. Encouraged to call for help and to avoid getting out of bed by herself. Avoid sedative/ hypnotic agents. D/c standing order for benadryl and will have her on prn only.  PSVT To follow with cardiology and have loop recorder placed. Will review notes. Continue aspirin and statin. Started on pindolol 2.5 mg daily from today. Her metoprolol has been discontinued. Monitor bp and HR  Hypotension bp better today. Off metoprolol at present, monitor bp readings  Hyperlipidemia Continue lipitor and ezetemibe  for now  gerd Continue omeprazole 40 mg daily for now  Hypothyroidism Continue levothyroxine 50 mcg daily   Family/ staff Communication: reviewed care plan with patient and nursing supervisor  Labs/tests ordered- cbc, cmp    Blanchie Serve, MD  [place] [phone] (Monday-Friday 8 am - 5 pm) [phone] (afterhours)

## 2014-09-12 DIAGNOSIS — S022XXA Fracture of nasal bones, initial encounter for closed fracture: Secondary | ICD-10-CM | POA: Insufficient documentation

## 2014-09-20 ENCOUNTER — Other Ambulatory Visit: Payer: Self-pay | Admitting: *Deleted

## 2014-09-20 ENCOUNTER — Telehealth: Payer: Self-pay | Admitting: Internal Medicine

## 2014-09-20 DIAGNOSIS — Z029 Encounter for administrative examinations, unspecified: Secondary | ICD-10-CM

## 2014-09-20 NOTE — Telephone Encounter (Signed)
Mrs. [name] daughter dropped off a dmv form to be filled out by Dr. Nyoka Cowden.

## 2014-09-22 ENCOUNTER — Other Ambulatory Visit: Payer: Self-pay | Admitting: *Deleted

## 2014-09-22 MED ORDER — HYDROCODONE-ACETAMINOPHEN 5-325 MG PO TABS: ORAL_TABLET | ORAL | Status: DC

## 2014-09-22 NOTE — Telephone Encounter (Signed)
[name]

## 2014-09-22 NOTE — Telephone Encounter (Signed)
Given to Dr. Nyoka Cowden to fill out and sign.

## 2014-09-30 ENCOUNTER — Non-Acute Institutional Stay (SKILLED_NURSING_FACILITY): Payer: Medicare Other | Admitting: Internal Medicine

## 2014-09-30 DIAGNOSIS — I10 Essential (primary) hypertension: Secondary | ICD-10-CM

## 2014-09-30 DIAGNOSIS — R0600 Dyspnea, unspecified: Secondary | ICD-10-CM

## 2014-09-30 DIAGNOSIS — S81001D Unspecified open wound, right knee, subsequent encounter: Secondary | ICD-10-CM

## 2014-09-30 DIAGNOSIS — S022XXD Fracture of nasal bones, subsequent encounter for fracture with routine healing: Secondary | ICD-10-CM

## 2014-09-30 DIAGNOSIS — R269 Unspecified abnormalities of gait and mobility: Secondary | ICD-10-CM

## 2014-10-03 ENCOUNTER — Encounter: Payer: Self-pay | Admitting: Internal Medicine

## 2014-10-03 DIAGNOSIS — S81009A Unspecified open wound, unspecified knee, initial encounter: Secondary | ICD-10-CM | POA: Insufficient documentation

## 2014-10-03 NOTE — Progress Notes (Signed)
[name], female   DOB: 1920-07-19, 80 y.o.   MRN: [id number]    [facility] Room Number: 150  Place of Service: ALF (13)    No Known Allergies  Chief Complaint  Patient presents with  . Wound Check    HPI:  Discharge to home.  Patient has been in the rehabilitation unit [facility] for nearly 3 weeks. She had a fall which caused her to sustain a fracture of the nose. There were palpitations and bradycardia. She has been wearing a Holter monitor. She sustained an open wound of the right knee. It is healing well at this time. There were facial contusions and a laceration which are healing. Sutures on the nose were discontinued 09/20/14.  The last 3 nights she has had some dyspnea at night. They've been normal oxygen saturations, no fever, no cough, no diaphoresis, and no nausea. She has not had any chest pain with this.  Medications: Patient's Medications  New Prescriptions   No medications on file  Previous Medications   ACETAMINOPHEN (TYLENOL) 325 MG TABLET    Take 650 mg by mouth every 6 (six) hours as needed.   ASPIRIN EC 81 MG TABLET    Take 81 mg by mouth every evening.   ATORVASTATIN (LIPITOR) 20 MG TABLET    Take 20 mg by mouth daily.   BRIMONIDINE (ALPHAGAN) 0.2 % OPHTHALMIC SOLUTION    Place 1 drop into both eyes 2 (two) times daily.   DIPHENHYDRAMINE (SOMINEX) 25 MG TABLET    Take 25 mg by mouth at bedtime.   EZETIMIBE (ZETIA) 10 MG TABLET    Take 10 mg by mouth daily.   HYDROCODONE-ACETAMINOPHEN (NORCO/VICODIN) 5-325 MG PER TABLET    Take one tablet by mouth every 4 hours as needed for pain   IMIPRAMINE (TOFRANIL) 25 MG TABLET    Take 50 mg by mouth at bedtime. To reduce urinary frequency   LATANOPROST (XALATAN) 0.005 % OPHTHALMIC SOLUTION    Place 1 drop into both eyes every evening. UAD   LEVOTHYROXINE (SYNTHROID, LEVOTHROID) 50 MCG TABLET    Take 50 mcg by mouth daily before breakfast.   MULTIPLE VITAMIN  (MULTIVITAMIN WITH MINERALS) TABS    Take 1 tablet by mouth daily. Certavite-A   OMEPRAZOLE (PRILOSEC) 40 MG CAPSULE    Take 40 mg by mouth daily.   PINDOLOL (VISKEN) 5 MG TABLET    TAKE ONE-HALF TABLET BY MOUTH DAILY  Modified Medications   No medications on file  Discontinued Medications   PINDOLOL PO    Take 2.5 mg by mouth daily.     Review of Systems  Constitutional: Negative for fever, chills, weight loss, malaise/fatigue and diaphoresis.  HENT: Positive for hearing loss. Negative for congestion and sore throat.   Eyes: Negative for blurred vision and double vision.  Respiratory: Positive for shortness of breath (mainly nocturnal.). Negative for cough and sputum production.   Cardiovascular: Negative for chest pain, palpitations, leg swelling and PND.       PSVT and syncopal episodes.  Gastrointestinal: Negative for heartburn, nausea, diarrhea and constipation.  Genitourinary: Positive for frequency. Negative for dysuria, urgency, hematuria and flank pain.  Musculoskeletal: Positive for gait problem (multiple falls.). Negative for back pain, myalgias and neck pain.  Skin: Negative for itching and rash.       Growth on left triceps. Healed laceration of the nose. Right periorbital ecchymoses which are resolving. Healing laceration/wound of the right knee.  Neurological:  Negative for tingling, tremors, sensory change, speech change, focal weakness, seizures, weakness and headaches.  Hematological: Bruises/bleeds easily.  Psychiatric/Behavioral: Positive for memory loss and decreased concentration. Negative for depression, suicidal ideas, hallucinations, behavioral problems, self-injury and dysphoric mood. The patient does not have insomnia.     Filed Vitals:   10/03/14 0959  BP: 150/67  Pulse: 54  Temp: 98.8 F (37.1 C)  TempSrc: Oral  Resp: 18  SpO2: 94%   There is no weight on file to calculate BMI.  Physical Exam  Constitutional: She appears well-developed and  well-nourished.  HENT:  Bilateral hearing loss  Eyes: Conjunctivae are normal. Pupils are equal, round, and reactive to light.  Neck: Normal range of motion. No JVD present. No tracheal deviation present. No thyromegaly present.  Cardiovascular: Normal rate, regular rhythm, normal heart sounds and intact distal pulses.  Exam reveals no gallop and no friction rub.   No murmur heard. Pulmonary/Chest: Effort normal and breath sounds normal. No respiratory distress. She has no wheezes. She has no rales.  Abdominal: Bowel sounds are normal. She exhibits no mass. There is no tenderness. There is no guarding.  Musculoskeletal: Normal range of motion. She exhibits no edema.  Large ganglion of the proximal phalanx of the right fifth digit. Some restriction in full flexion. Unstable gait.  Lymphadenopathy:    She has no cervical adenopathy.  Neurological: She is alert. She has normal reflexes. No cranial nerve deficit. Coordination normal.  Memory loss.  Skin: No rash noted. No erythema.  Discolored skin in legs and arms from multiple injuries, sun damage, and skin cancers. Healing wound of the nose. Periorbital ecchymosis on the right side.  Psychiatric: She has a normal mood and affect. Her behavior is normal. Judgment and thought content normal.     Labs reviewed: Admission on 09/08/2014, Discharged on 09/08/2014  Component Date Value Ref Range Status  . WBC 09/08/2014 10.4  4.0 - 10.5 K/uL Final  . RBC 09/08/2014 4.30  3.87 - 5.11 MIL/uL Final  . Hemoglobin 09/08/2014 14.0  12.0 - 15.0 g/dL Final  . HCT 09/08/2014 42.4  36.0 - 46.0 % Final  . MCV 09/08/2014 98.6  78.0 - 100.0 fL Final  . MCH 09/08/2014 32.6  26.0 - 34.0 pg Final  . MCHC 09/08/2014 33.0  30.0 - 36.0 g/dL Final  . RDW 09/08/2014 13.4  11.5 - 15.5 % Final  . Platelets 09/08/2014 182  150 - 400 K/uL Final  . Neutrophils Relative % 09/08/2014 73  43 - 77 % Final  . Neutro Abs 09/08/2014 7.6  1.7 - 7.7 K/uL Final  .  Lymphocytes Relative 09/08/2014 16  12 - 46 % Final  . Lymphs Abs 09/08/2014 1.6  0.7 - 4.0 K/uL Final  . Monocytes Relative 09/08/2014 11  3 - 12 % Final  . Monocytes Absolute 09/08/2014 1.1* 0.1 - 1.0 K/uL Final  . Eosinophils Relative 09/08/2014 0  0 - 5 % Final  . Eosinophils Absolute 09/08/2014 0.0  0.0 - 0.7 K/uL Final  . Basophils Relative 09/08/2014 0  0 - 1 % Final  . Basophils Absolute 09/08/2014 0.0  0.0 - 0.1 K/uL Final  . Sodium 09/08/2014 139  137 - 147 mEq/L Final  . Potassium 09/08/2014 4.1  3.7 - 5.3 mEq/L Final  . Chloride 09/08/2014 103  96 - 112 mEq/L Final  . CO2 09/08/2014 25  19 - 32 mEq/L Final  . Glucose, Bld 09/08/2014 125* 70 - 99 mg/dL Final  .  BUN 09/08/2014 21  6 - 23 mg/dL Final  . Creatinine, Ser 09/08/2014 1.04  0.50 - 1.10 mg/dL Final  . Calcium 09/08/2014 8.7  8.4 - 10.5 mg/dL Final  . GFR calc non Af Amer 09/08/2014 45* >90 mL/min Final  . GFR calc Af Amer 09/08/2014 52* >90 mL/min Final   Comment: (NOTE)                          The eGFR has been calculated using the CKD EPI equation.                          This calculation has not been validated in all clinical situations.                          eGFR's persistently <90 mL/min signify possible Chronic Kidney                          Disease.  . Anion gap 09/08/2014 11  5 - 15 Final  . Troponin I 09/08/2014 <0.30  <0.30 ng/mL Final   Comment:                                 Due to the release kinetics of cTnI,                          a negative result within the first hours                          of the onset of symptoms does not rule out                          myocardial infarction with certainty.                          If myocardial infarction is still suspected,                          repeat the test at appropriate intervals.  . Color, Urine 09/08/2014 AMBER* YELLOW Final   BIOCHEMICALS MAY BE AFFECTED BY COLOR  . APPearance 09/08/2014 CLOUDY* CLEAR Final  . Specific Gravity,  Urine 09/08/2014 1.021  1.005 - 1.030 Final  . pH 09/08/2014 5.5  5.0 - 8.0 Final  . Glucose, UA 09/08/2014 NEGATIVE  NEGATIVE mg/dL Final  . Hgb urine dipstick 09/08/2014 NEGATIVE  NEGATIVE Final  . Bilirubin Urine 09/08/2014 NEGATIVE  NEGATIVE Final  . Ketones, ur 09/08/2014 NEGATIVE  NEGATIVE mg/dL Final  . Protein, ur 09/08/2014 NEGATIVE  NEGATIVE mg/dL Final  . Urobilinogen, UA 09/08/2014 0.2  0.0 - 1.0 mg/dL Final  . Nitrite 09/08/2014 NEGATIVE  NEGATIVE Final  . Leukocytes, UA 09/08/2014 MODERATE* NEGATIVE Final  . Squamous Epithelial / LPF 09/08/2014 RARE  RARE Final  . WBC, UA 09/08/2014 7-10  <3 WBC/hpf Final  . Bacteria, UA 09/08/2014 RARE  RARE Final  . Casts 09/08/2014 HYALINE CASTS* NEGATIVE Final  Admission on 09/02/2014, Discharged on 09/02/2014  Component Date Value Ref Range Status  . WBC 09/02/2014 6.4  4.0 - 10.5 K/uL Final  . RBC 09/02/2014 4.40  3.87 - 5.11 MIL/uL Final  . Hemoglobin 09/02/2014 14.6  12.0 - 15.0 g/dL Final  . HCT 09/02/2014 43.0  36.0 - 46.0 % Final  . MCV 09/02/2014 97.7  78.0 - 100.0 fL Final  . MCH 09/02/2014 33.2  26.0 - 34.0 pg Final  . MCHC 09/02/2014 34.0  30.0 - 36.0 g/dL Final  . RDW 09/02/2014 13.1  11.5 - 15.5 % Final  . Platelets 09/02/2014 173  150 - 400 K/uL Final  . Neutrophils Relative % 09/02/2014 67  43 - 77 % Final  . Neutro Abs 09/02/2014 4.3  1.7 - 7.7 K/uL Final  . Lymphocytes Relative 09/02/2014 21  12 - 46 % Final  . Lymphs Abs 09/02/2014 1.3  0.7 - 4.0 K/uL Final  . Monocytes Relative 09/02/2014 9  3 - 12 % Final  . Monocytes Absolute 09/02/2014 0.6  0.1 - 1.0 K/uL Final  . Eosinophils Relative 09/02/2014 3  0 - 5 % Final  . Eosinophils Absolute 09/02/2014 0.2  0.0 - 0.7 K/uL Final  . Basophils Relative 09/02/2014 0  0 - 1 % Final  . Basophils Absolute 09/02/2014 0.0  0.0 - 0.1 K/uL Final  . Sodium 09/02/2014 141  137 - 147 mEq/L Final  . Potassium 09/02/2014 4.4  3.7 - 5.3 mEq/L Final  . Chloride 09/02/2014 106  96  - 112 mEq/L Final  . CO2 09/02/2014 25  19 - 32 mEq/L Final  . Glucose, Bld 09/02/2014 104* 70 - 99 mg/dL Final  . BUN 09/02/2014 26* 6 - 23 mg/dL Final  . Creatinine, Ser 09/02/2014 0.94  0.50 - 1.10 mg/dL Final  . Calcium 09/02/2014 9.0  8.4 - 10.5 mg/dL Final  . GFR calc non Af Amer 09/02/2014 51* >90 mL/min Final  . GFR calc Af Amer 09/02/2014 59* >90 mL/min Final   Comment: (NOTE)                          The eGFR has been calculated using the CKD EPI equation.                          This calculation has not been validated in all clinical situations.                          eGFR's persistently <90 mL/min signify possible Chronic Kidney                          Disease.  . Anion gap 09/02/2014 10  5 - 15 Final  . Troponin I 09/02/2014 <0.30  <0.30 ng/mL Final   Comment:                                 Due to the release kinetics of cTnI,                          a negative result within the first hours                          of the onset of symptoms does not rule out  myocardial infarction with certainty.                          If myocardial infarction is still suspected,                          repeat the test at appropriate intervals.  . Color, Urine 09/02/2014 YELLOW  YELLOW Final  . APPearance 09/02/2014 CLOUDY* CLEAR Final  . Specific Gravity, Urine 09/02/2014 1.014  1.005 - 1.030 Final  . pH 09/02/2014 7.5  5.0 - 8.0 Final  . Glucose, UA 09/02/2014 NEGATIVE  NEGATIVE mg/dL Final  . Hgb urine dipstick 09/02/2014 NEGATIVE  NEGATIVE Final  . Bilirubin Urine 09/02/2014 NEGATIVE  NEGATIVE Final  . Ketones, ur 09/02/2014 NEGATIVE  NEGATIVE mg/dL Final  . Protein, ur 09/02/2014 NEGATIVE  NEGATIVE mg/dL Final  . Urobilinogen, UA 09/02/2014 0.2  0.0 - 1.0 mg/dL Final  . Nitrite 09/02/2014 NEGATIVE  NEGATIVE Final  . Leukocytes, UA 09/02/2014 TRACE* NEGATIVE Final  . Total CK 09/02/2014 112  7 - 177 U/L Final  . Troponin I 09/02/2014 <0.30  <0.30  ng/mL Final   Comment:                                 Due to the release kinetics of cTnI,                          a negative result within the first hours                          of the onset of symptoms does not rule out                          myocardial infarction with certainty.                          If myocardial infarction is still suspected,                          repeat the test at appropriate intervals.  . Squamous Epithelial / LPF 09/02/2014 RARE  RARE Final  . WBC, UA 09/02/2014 0-2  <3 WBC/hpf Final  . Bacteria, UA 09/02/2014 RARE  RARE Final  Admission on 07/14/2014, Discharged on 07/14/2014  Component Date Value Ref Range Status  . Sodium 07/14/2014 136* 137 - 147 mEq/L Final  . Potassium 07/14/2014 5.0  3.7 - 5.3 mEq/L Final  . Chloride 07/14/2014 99  96 - 112 mEq/L Final  . CO2 07/14/2014 24  19 - 32 mEq/L Final  . Glucose, Bld 07/14/2014 131* 70 - 99 mg/dL Final  . BUN 07/14/2014 32* 6 - 23 mg/dL Final  . Creatinine, Ser 07/14/2014 1.19* 0.50 - 1.10 mg/dL Final  . Calcium 07/14/2014 9.4  8.4 - 10.5 mg/dL Final  . GFR calc non Af Amer 07/14/2014 38* >90 mL/min Final  . GFR calc Af Amer 07/14/2014 45* >90 mL/min Final   Comment: (NOTE)                          The eGFR has been calculated using the CKD EPI equation.  This calculation has not been validated in all clinical situations.                          eGFR's persistently <90 mL/min signify possible Chronic Kidney                          Disease.  . Anion gap 07/14/2014 13  5 - 15 Final  . WBC 07/14/2014 9.8  4.0 - 10.5 K/uL Final  . RBC 07/14/2014 4.26  3.87 - 5.11 MIL/uL Final  . Hemoglobin 07/14/2014 14.2  12.0 - 15.0 g/dL Final  . HCT 07/14/2014 42.9  36.0 - 46.0 % Final  . MCV 07/14/2014 100.7* 78.0 - 100.0 fL Final  . MCH 07/14/2014 33.3  26.0 - 34.0 pg Final  . MCHC 07/14/2014 33.1  30.0 - 36.0 g/dL Final  . RDW 07/14/2014 13.4  11.5 - 15.5 % Final  . Platelets  07/14/2014 212  150 - 400 K/uL Final  . Color, Urine 07/14/2014 YELLOW  YELLOW Final  . APPearance 07/14/2014 CLEAR  CLEAR Final  . Specific Gravity, Urine 07/14/2014 1.017  1.005 - 1.030 Final  . pH 07/14/2014 6.0  5.0 - 8.0 Final  . Glucose, UA 07/14/2014 NEGATIVE  NEGATIVE mg/dL Final  . Hgb urine dipstick 07/14/2014 NEGATIVE  NEGATIVE Final  . Bilirubin Urine 07/14/2014 NEGATIVE  NEGATIVE Final  . Ketones, ur 07/14/2014 NEGATIVE  NEGATIVE mg/dL Final  . Protein, ur 07/14/2014 NEGATIVE  NEGATIVE mg/dL Final  . Urobilinogen, UA 07/14/2014 0.2  0.0 - 1.0 mg/dL Final  . Nitrite 07/14/2014 NEGATIVE  NEGATIVE Final  . Leukocytes, UA 07/14/2014 NEGATIVE  NEGATIVE Final   MICROSCOPIC NOT DONE ON URINES WITH NEGATIVE PROTEIN, BLOOD, LEUKOCYTES, NITRITE, OR GLUCOSE <1000 mg/dL.     Assessment/Plan 1. Closed fracture nose, with routine healing, subsequent encounter Healing  2. Open wound of knee, right, subsequent encounter Healing  3. Dyspnea  mainly nocturnal  4. Essential hypertension Mild elevation in systolic blood pressure  5. Gait disorder Using a walker. Remains at high risk for further episodes of falling

## 2014-10-05 ENCOUNTER — Telehealth: Payer: Self-pay

## 2014-10-05 ENCOUNTER — Other Ambulatory Visit: Payer: Self-pay | Admitting: *Deleted

## 2014-10-05 ENCOUNTER — Other Ambulatory Visit: Payer: Self-pay | Admitting: Internal Medicine

## 2014-10-05 MED ORDER — CELECOXIB 200 MG PO CAPS: ORAL_CAPSULE | ORAL | Status: DC

## 2014-10-05 NOTE — Telephone Encounter (Signed)
appt made with Dr. Radene Journey for Monday 10/10/14 at 1:45, [address] , [place]. Called left message on patient's recorder. Called and talked with daughter [name] and gave her the date and time. Fracture nose 09/26/14.

## 2014-10-05 NOTE — Telephone Encounter (Signed)
Seth Bake with [facility] called and stated that Dr. Nyoka Cowden prescribed patient Celebrex in Rehab and now patient is d/c home and needs a Rx. I spoke with Dr. Nyoka Cowden and he confirmed Celebrex 200mg  once daily #30. Notified Seth Bake and  Faxed Rx to pharmacy.

## 2014-10-06 ENCOUNTER — Inpatient Hospital Stay (HOSPITAL_COMMUNITY)
Admission: EM | Admit: 2014-10-06 | Discharge: 2014-10-08 | DRG: 308 | Disposition: A | Discharge status: Nursing Home (Includes ICF) | Payer: Medicare Other | Attending: Cardiovascular Disease | Admitting: Cardiovascular Disease

## 2014-10-06 ENCOUNTER — Emergency Department (HOSPITAL_COMMUNITY): Payer: Medicare Other

## 2014-10-06 ENCOUNTER — Encounter (HOSPITAL_COMMUNITY): Payer: Self-pay | Admitting: Emergency Medicine

## 2014-10-06 DIAGNOSIS — R55 Syncope and collapse: Secondary | ICD-10-CM

## 2014-10-06 DIAGNOSIS — I2582 Chronic total occlusion of coronary artery: Secondary | ICD-10-CM | POA: Diagnosis not present

## 2014-10-06 DIAGNOSIS — I2510 Atherosclerotic heart disease of native coronary artery without angina pectoris: Secondary | ICD-10-CM | POA: Diagnosis present

## 2014-10-06 DIAGNOSIS — I509 Heart failure, unspecified: Secondary | ICD-10-CM

## 2014-10-06 DIAGNOSIS — R001 Bradycardia, unspecified: Secondary | ICD-10-CM

## 2014-10-06 DIAGNOSIS — Z7982 Long term (current) use of aspirin: Secondary | ICD-10-CM | POA: Diagnosis not present

## 2014-10-06 DIAGNOSIS — Z79899 Other long term (current) drug therapy: Secondary | ICD-10-CM

## 2014-10-06 DIAGNOSIS — F0390 Unspecified dementia without behavioral disturbance: Secondary | ICD-10-CM | POA: Diagnosis present

## 2014-10-06 DIAGNOSIS — E039 Hypothyroidism, unspecified: Secondary | ICD-10-CM | POA: Diagnosis present

## 2014-10-06 DIAGNOSIS — Z9842 Cataract extraction status, left eye: Secondary | ICD-10-CM | POA: Diagnosis not present

## 2014-10-06 DIAGNOSIS — M1990 Unspecified osteoarthritis, unspecified site: Secondary | ICD-10-CM | POA: Diagnosis present

## 2014-10-06 DIAGNOSIS — Z66 Do not resuscitate: Secondary | ICD-10-CM | POA: Diagnosis present

## 2014-10-06 DIAGNOSIS — E785 Hyperlipidemia, unspecified: Secondary | ICD-10-CM | POA: Diagnosis present

## 2014-10-06 DIAGNOSIS — R0600 Dyspnea, unspecified: Secondary | ICD-10-CM | POA: Diagnosis not present

## 2014-10-06 DIAGNOSIS — I5033 Acute on chronic diastolic (congestive) heart failure: Secondary | ICD-10-CM | POA: Diagnosis present

## 2014-10-06 DIAGNOSIS — I10 Essential (primary) hypertension: Secondary | ICD-10-CM | POA: Diagnosis present

## 2014-10-06 DIAGNOSIS — K219 Gastro-esophageal reflux disease without esophagitis: Secondary | ICD-10-CM | POA: Diagnosis present

## 2014-10-06 DIAGNOSIS — Z9841 Cataract extraction status, right eye: Secondary | ICD-10-CM

## 2014-10-06 DIAGNOSIS — I252 Old myocardial infarction: Secondary | ICD-10-CM | POA: Diagnosis not present

## 2014-10-06 DIAGNOSIS — G609 Hereditary and idiopathic neuropathy, unspecified: Secondary | ICD-10-CM | POA: Diagnosis present

## 2014-10-06 DIAGNOSIS — Z85828 Personal history of other malignant neoplasm of skin: Secondary | ICD-10-CM | POA: Diagnosis not present

## 2014-10-06 DIAGNOSIS — I447 Left bundle-branch block, unspecified: Secondary | ICD-10-CM | POA: Diagnosis present

## 2014-10-06 DIAGNOSIS — I495 Sick sinus syndrome: Principal | ICD-10-CM

## 2014-10-06 DIAGNOSIS — I471 Supraventricular tachycardia, unspecified: Secondary | ICD-10-CM | POA: Diagnosis present

## 2014-10-06 DIAGNOSIS — H409 Unspecified glaucoma: Secondary | ICD-10-CM | POA: Diagnosis present

## 2014-10-06 DIAGNOSIS — I350 Nonrheumatic aortic (valve) stenosis: Secondary | ICD-10-CM | POA: Diagnosis present

## 2014-10-06 DIAGNOSIS — I6521 Occlusion and stenosis of right carotid artery: Secondary | ICD-10-CM | POA: Diagnosis present

## 2014-10-06 DIAGNOSIS — I25119 Atherosclerotic heart disease of native coronary artery with unspecified angina pectoris: Secondary | ICD-10-CM

## 2014-10-06 DIAGNOSIS — R002 Palpitations: Secondary | ICD-10-CM

## 2014-10-06 LAB — COMPREHENSIVE METABOLIC PANEL
ALT: 29 U/L (ref 0–35)
ANION GAP: 12 (ref 5–15)
AST: 34 U/L (ref 0–37)
Albumin: 3.2 g/dL — ABNORMAL LOW (ref 3.5–5.2)
Alkaline Phosphatase: 137 U/L — ABNORMAL HIGH (ref 39–117)
BUN: 20 mg/dL (ref 6–23)
CO2: 24 mEq/L (ref 19–32)
CREATININE: 0.94 mg/dL (ref 0.50–1.10)
Calcium: 8.9 mg/dL (ref 8.4–10.5)
Chloride: 100 mEq/L (ref 96–112)
GFR calc Af Amer: 59 mL/min — ABNORMAL LOW (ref >90)
GFR calc non Af Amer: 51 mL/min — ABNORMAL LOW (ref >90)
GLUCOSE: 79 mg/dL (ref 70–99)
Potassium: 4.6 mEq/L (ref 3.7–5.3)
Sodium: 136 mEq/L — ABNORMAL LOW (ref 137–147)
Total Bilirubin: 0.6 mg/dL (ref 0.3–1.2)
Total Protein: 7.7 g/dL (ref 6.0–8.3)

## 2014-10-06 LAB — CBC WITH DIFFERENTIAL/PLATELET
BASOS PCT: 0 % (ref 0–1)
Basophils Absolute: 0 10*3/uL (ref 0.0–0.1)
EOS ABS: 0.3 10*3/uL (ref 0.0–0.7)
EOS PCT: 4 % (ref 0–5)
HCT: 40.2 % (ref 36.0–46.0)
HEMOGLOBIN: 13.2 g/dL (ref 12.0–15.0)
LYMPHS ABS: 1.8 10*3/uL (ref 0.7–4.0)
Lymphocytes Relative: 23 % (ref 12–46)
MCH: 32.2 pg (ref 26.0–34.0)
MCHC: 32.8 g/dL (ref 30.0–36.0)
MCV: 98 fL (ref 78.0–100.0)
MONO ABS: 0.7 10*3/uL (ref 0.1–1.0)
Monocytes Relative: 8 % (ref 3–12)
Neutro Abs: 5 10*3/uL (ref 1.7–7.7)
Neutrophils Relative %: 65 % (ref 43–77)
Platelets: 232 10*3/uL (ref 150–400)
RBC: 4.1 MIL/uL (ref 3.87–5.11)
RDW: 14.7 % (ref 11.5–15.5)
WBC: 7.8 10*3/uL (ref 4.0–10.5)

## 2014-10-06 LAB — URINALYSIS, ROUTINE W REFLEX MICROSCOPIC
Bilirubin Urine: NEGATIVE
GLUCOSE, UA: NEGATIVE mg/dL
Hgb urine dipstick: NEGATIVE
KETONES UR: NEGATIVE mg/dL
Nitrite: NEGATIVE
Protein, ur: NEGATIVE mg/dL
Specific Gravity, Urine: 1.005 (ref 1.005–1.030)
UROBILINOGEN UA: 0.2 mg/dL (ref 0.0–1.0)
pH: 6.5 (ref 5.0–8.0)

## 2014-10-06 LAB — URINE MICROSCOPIC-ADD ON

## 2014-10-06 LAB — TROPONIN I: Troponin I: <0.3 ng/mL (ref <0.30)

## 2014-10-06 LAB — PRO B NATRIURETIC PEPTIDE: Pro B Natriuretic peptide (BNP): 4426 pg/mL — ABNORMAL HIGH (ref 0–450)

## 2014-10-06 MED ORDER — HYDROCODONE-ACETAMINOPHEN 5-325 MG PO TABS: 1.0000 | ORAL_TABLET | Freq: Four times a day (QID) | ORAL | Status: DC | PRN

## 2014-10-06 MED ORDER — PANTOPRAZOLE SODIUM 40 MG PO TBEC
40.0000 mg | DELAYED_RELEASE_TABLET | Freq: Every day | ORAL | Status: DC
  Administered 2014-10-07 – 2014-10-08 (×2): 40 mg via ORAL
  Filled 2014-10-06: qty 1

## 2014-10-06 MED ORDER — LATANOPROST 0.005 % OP SOLN
1.0000 [drp] | Freq: Every evening | OPHTHALMIC | Status: DC
  Administered 2014-10-06 – 2014-10-07 (×2): 1 [drp] via OPHTHALMIC
  Filled 2014-10-06: qty 2.5

## 2014-10-06 MED ORDER — FUROSEMIDE 10 MG/ML IJ SOLN
40.0000 mg | Freq: Once | INTRAMUSCULAR | Status: AC
  Administered 2014-10-06: 40 mg via INTRAVENOUS
  Filled 2014-10-06: qty 4

## 2014-10-06 MED ORDER — ASPIRIN EC 81 MG PO TBEC
81.0000 mg | DELAYED_RELEASE_TABLET | Freq: Every evening | ORAL | Status: DC
  Administered 2014-10-07: 81 mg via ORAL
  Filled 2014-10-06 (×2): qty 1

## 2014-10-06 MED ORDER — BRIMONIDINE TARTRATE 0.2 % OP SOLN
1.0000 [drp] | Freq: Two times a day (BID) | OPHTHALMIC | Status: DC
  Administered 2014-10-06 – 2014-10-08 (×4): 1 [drp] via OPHTHALMIC
  Filled 2014-10-06 (×2): qty 5

## 2014-10-06 MED ORDER — EZETIMIBE 10 MG PO TABS
10.0000 mg | ORAL_TABLET | Freq: Every day | ORAL | Status: DC
  Administered 2014-10-07 – 2014-10-08 (×2): 10 mg via ORAL
  Filled 2014-10-06 (×2): qty 1

## 2014-10-06 MED ORDER — IMIPRAMINE HCL 50 MG PO TABS
50.0000 mg | ORAL_TABLET | Freq: Every day | ORAL | Status: DC
Start: 2014-10-07 — End: 2014-10-08
  Administered 2014-10-07: 50 mg via ORAL
  Filled 2014-10-06 (×3): qty 1

## 2014-10-06 MED ORDER — ONDANSETRON HCL 4 MG/2ML IJ SOLN: 4.0000 mg | Freq: Four times a day (QID) | INTRAMUSCULAR | Status: DC | PRN

## 2014-10-06 MED ORDER — ZOLPIDEM TARTRATE 5 MG PO TABS
5.0000 mg | ORAL_TABLET | Freq: Every day | ORAL | Status: DC
  Administered 2014-10-06 – 2014-10-07 (×2): 5 mg via ORAL
  Filled 2014-10-06 (×2): qty 1

## 2014-10-06 MED ORDER — DIPHENHYDRAMINE HCL (SLEEP) 25 MG PO TABS: 25.0000 mg | ORAL_TABLET | Freq: Every day | ORAL | Status: DC

## 2014-10-06 MED ORDER — LEVOTHYROXINE SODIUM 50 MCG PO TABS
50.0000 ug | ORAL_TABLET | Freq: Every day | ORAL | Status: DC
  Administered 2014-10-07 – 2014-10-08 (×2): 50 ug via ORAL
  Filled 2014-10-06 (×3): qty 1

## 2014-10-06 MED ORDER — ADULT MULTIVITAMIN W/MINERALS CH
1.0000 | ORAL_TABLET | Freq: Every day | ORAL | Status: DC
  Administered 2014-10-06 – 2014-10-08 (×3): 1 via ORAL
  Filled 2014-10-06 (×3): qty 1

## 2014-10-06 MED ORDER — SODIUM CHLORIDE 0.9 % IJ SOLN: 3.0000 mL | INTRAMUSCULAR | Status: DC | PRN

## 2014-10-06 MED ORDER — PINDOLOL 5 MG PO TABS
5.0000 mg | ORAL_TABLET | Freq: Every day | ORAL | Status: DC
  Administered 2014-10-07 – 2014-10-08 (×2): 5 mg via ORAL
  Filled 2014-10-06 (×3): qty 1

## 2014-10-06 MED ORDER — ACETAMINOPHEN 325 MG PO TABS: 650.0000 mg | ORAL_TABLET | Freq: Four times a day (QID) | ORAL | Status: DC | PRN

## 2014-10-06 MED ORDER — ATORVASTATIN CALCIUM 20 MG PO TABS
20.0000 mg | ORAL_TABLET | Freq: Every day | ORAL | Status: DC
  Administered 2014-10-07: 20 mg via ORAL
  Filled 2014-10-06 (×2): qty 1

## 2014-10-06 MED ORDER — FUROSEMIDE 10 MG/ML IJ SOLN
20.0000 mg | Freq: Once | INTRAMUSCULAR | Status: AC
  Administered 2014-10-06: 20 mg via INTRAVENOUS
  Filled 2014-10-06: qty 2

## 2014-10-06 MED ORDER — HEPARIN SODIUM (PORCINE) 5000 UNIT/ML IJ SOLN
5000.0000 [IU] | Freq: Three times a day (TID) | INTRAMUSCULAR | Status: DC
  Administered 2014-10-06 – 2014-10-08 (×5): 5000 [IU] via SUBCUTANEOUS
  Filled 2014-10-06 (×7): qty 1

## 2014-10-06 MED ORDER — SODIUM CHLORIDE 0.9 % IJ SOLN
3.0000 mL | Freq: Two times a day (BID) | INTRAMUSCULAR | Status: DC
  Administered 2014-10-06 – 2014-10-07 (×3): 3 mL via INTRAVENOUS
  Administered 2014-10-08: 10:00:00 via INTRAVENOUS

## 2014-10-06 MED ORDER — SODIUM CHLORIDE 0.9 % IV SOLN: 250.0000 mL | INTRAVENOUS | Status: DC | PRN

## 2014-10-06 MED ORDER — LISINOPRIL 10 MG PO TABS
10.0000 mg | ORAL_TABLET | Freq: Every day | ORAL | Status: DC
  Administered 2014-10-06 – 2014-10-08 (×3): 10 mg via ORAL
  Filled 2014-10-06 (×3): qty 1

## 2014-10-06 NOTE — ED Notes (Signed)
O2 Sats around 89-90%, pt placed on 2L [place], O2 sats up to 95%.

## 2014-10-06 NOTE — ED Provider Notes (Signed)
CSN: [id number]     Arrival date & time 10/06/14  1431 History   First MD Initiated Contact with Patient 10/06/14 1458     Chief Complaint  Patient presents with  . Chest Pain  . Weakness  . Shortness of Breath     (Consider location/radiation/quality/duration/timing/severity/associated sxs/prior Treatment) HPI 80 year old female with a history of CAD presents with chest pain, weakness, shortness of breath this morning. EMS reports this has been going on for longer. The patient states that now she feels asymptomatic. There is no other family in the room but one aide that comes to check on the patient states he just saw the patient when she came in to the ER. The patient lives at home with her husband but has aides that come to check on them. The patient denies any leg swelling. The patient has no prior documented history of CHF. She denies any discomfort at this time.  Past Medical History  Diagnosis Date  . SVT (supraventricular tachycardia)     Diagnosed 2010  . HTN (hypertension)   . Dyslipidemia   . CAD (coronary artery disease)     a. NSTEMI 2010 in setting of SVT, with cath with unsuccessful PCI - chronic total occlusion of LAD, R->L collaterals.  . GI bleeding   . Unspecified hypothyroidism   . Unspecified hereditary and idiopathic peripheral neuropathy   . Pneumonia, organism unspecified   . Basal cell carcinoma of skin of trunk, except scrotum   . Unspecified malignant neoplasm of skin of lower limb, including hip   . Basal cell carcinoma of skin of lower limb, including hip   . Unspecified glaucoma     left eye  . Other premature beats   . Other esophagitis   . Esophageal reflux   . Osteoarthrosis, unspecified whether generalized or localized, unspecified site     multiple joints  . Pain in joint, shoulder region   . Pain in joint, lower leg     knee  . Pain in joint, ankle and foot     right  . Sacroiliitis, not elsewhere classified   . Cervicalgia   . Lumbago    . Ganglion of tendon sheath   . Pathologic fracture of vertebrae   . Tietze's disease   . Insomnia, unspecified   . Disturbance of skin sensation   . Edema   . Palpitations   . Other nonspecific abnormal serum enzyme levels   . Nasal bones, closed fracture   . Closed fracture of unspecified trochanteric section of femur   . Open wound of knee, leg (except thigh), and ankle, without mention of complication   . Hip, thigh, leg, and ankle, abrasion or friction burn, without mention of infection   . Contusion of hip   . Fall from other slipping, tripping, or stumbling   . Personal history of fall   . Dyspepsia and other specified disorders of function of stomach   . Paroxysmal supraventricular tachycardia   . Acute bronchiolitis due to other infectious organisms 03/02/2013  . Other abnormal blood chemistry 03/15/2013  . Contusion of rib on left side 08/03/2013   Past Surgical History  Procedure Laterality Date  . Hip surgery  2001  . Breast lumpectomy  1995    right breast, negative for malignancy  . Cataract extraction  2003    bilateral  . Leg skin lesion  biopsy / excision Left 08/22/2010    Lower leg shave biopsy -solar keratosis (Dr. Radford Pax)   Family History  Problem Relation Age of Onset  . Stroke Neg Hx   . Heart attack Neg Hx   . Heart disease Father   . Cancer Brother     lung   History  Substance Use Topics  . Smoking status: Never Smoker   . Smokeless tobacco: Never Used     Comment: denies smoking cigarettes  . Alcohol Use: Yes     Comment: drinks a scotch or wine every other day    OB History   Grav Para Term Preterm Abortions TAB SAB Ect Mult Living                 Review of Systems  Constitutional: Positive for fatigue.  Respiratory: Positive for shortness of breath.   Cardiovascular: Positive for chest pain. Negative for leg swelling.  Neurological: Positive for weakness.  All other systems reviewed and are negative.     Allergies  Review of  patient's allergies indicates no known allergies.  Home Medications   Prior to Admission medications   Medication Sig Start Date End Date Taking? Authorizing Provider  acetaminophen (TYLENOL) 325 MG tablet Take 650 mg by mouth every 6 (six) hours as needed.   Yes Historical Provider, MD  aspirin EC 81 MG tablet Take 81 mg by mouth every evening.   Yes Historical Provider, MD  atorvastatin (LIPITOR) 20 MG tablet Take 20 mg by mouth daily.   Yes Historical Provider, MD  brimonidine (ALPHAGAN) 0.2 % ophthalmic solution Place 1 drop into both eyes 2 (two) times daily.   Yes Historical Provider, MD  celecoxib (CELEBREX) 200 MG capsule TAKE ONE CAPSULE BY MOUTH EVERY DAY 10/05/14  Yes Estill Dooms, MD  diphenhydrAMINE (SOMINEX) 25 MG tablet Take 25 mg by mouth at bedtime.   Yes Historical Provider, MD  ezetimibe (ZETIA) 10 MG tablet Take 10 mg by mouth daily.   Yes Historical Provider, MD  HYDROcodone-acetaminophen (NORCO/VICODIN) 5-325 MG per tablet Take one tablet by mouth every 4 hours as needed for pain 09/22/14  Yes Lauree Chandler, NP  imipramine (TOFRANIL) 25 MG tablet Take 50 mg by mouth at bedtime. To reduce urinary frequency   Yes Historical Provider, MD  latanoprost (XALATAN) 0.005 % ophthalmic solution Place 1 drop into both eyes every evening. UAD   Yes Historical Provider, MD  levothyroxine (SYNTHROID, LEVOTHROID) 50 MCG tablet Take 50 mcg by mouth daily before breakfast.   Yes Historical Provider, MD  Multiple Vitamin (MULTIVITAMIN WITH MINERALS) TABS Take 1 tablet by mouth daily. Certavite-A   Yes Historical Provider, MD  omeprazole (PRILOSEC) 40 MG capsule Take 40 mg by mouth daily.   Yes Historical Provider, MD  pindolol (VISKEN) 5 MG tablet TAKE ONE-HALF TABLET BY MOUTH DAILY 09/09/14  Yes Minus Breeding, MD   BP 178/70  Pulse 48  Temp(Src) 97.6 F (36.4 C) (Oral)  Resp 14  SpO2 94% Physical Exam  Nursing note and vitals reviewed. Constitutional: She is oriented to person,  place, and time. She appears well-developed and well-nourished. No distress.  HENT:  Head: Normocephalic and atraumatic.  Right Ear: External ear normal.  Left Ear: External ear normal.  Nose: Nose normal.  Eyes: Right eye exhibits no discharge. Left eye exhibits no discharge.  Cardiovascular: Regular rhythm and normal heart sounds.  Bradycardia present.   Pulmonary/Chest: Effort normal. She has rales (bibasilar rales).  Abdominal: Soft. There is no tenderness.  Musculoskeletal: She exhibits no edema and no tenderness.  Neurological: She is alert and oriented to person,  place, and time.  Skin: Skin is warm and dry.    ED Course  Procedures (including critical care time) Labs Review Labs Reviewed  COMPREHENSIVE METABOLIC PANEL - Abnormal; Notable for the following:    Sodium 136 (*)    Albumin 3.2 (*)    Alkaline Phosphatase 137 (*)    GFR calc non Af Amer 51 (*)    GFR calc Af Amer 59 (*)    All other components within normal limits  PRO B NATRIURETIC PEPTIDE - Abnormal; Notable for the following:    Pro B Natriuretic peptide (BNP) 4426.0 (*)    All other components within normal limits  CBC WITH DIFFERENTIAL  TROPONIN I  URINALYSIS, ROUTINE W REFLEX MICROSCOPIC    Imaging Review Dg Chest 2 View  10/06/2014   CLINICAL DATA:  Weakness and palpitations. ; history of MI ; patient is unsure of the length of time of symptoms. ; history of palpitations ; initial visit  EXAM: CHEST  2 VIEW  COMPARISON:  PA and lateral chest of July 14, 2014  FINDINGS: The lungs are mildly hypoinflated. There are new bilateral pleural effusions. The pulmonary interstitial markings are increased. The cardiopericardial silhouette is enlarged. The pulmonary vascularity is engorged. The bony thorax is unremarkable.  IMPRESSION: Congestive heart failure with pulmonary interstitial edema and small bilateral pleural effusions. These findings are new since the previous study.   Electronically Signed   By: David   [place]   On: 10/06/2014 15:37     EKG Interpretation   Date/Time:  Thursday October 06 2014 14:34:43 EDT Ventricular Rate:  49 PR Interval:  196 QRS Duration: 133 QT Interval:  548 QTC Calculation: 495 R Axis:   33 Text Interpretation:  Sinus bradycardia Left bundle branch block No  significant change since last tracing Confirmed by [place]  ([id number]) on 10/06/2014 3:02:03 PM      MDM   Final diagnoses:  Acute congestive heart failure, unspecified congestive heart failure type    Patient's exam and workup is consistent with new onset CHF. She is in no distress but does have borderline oxygen saturations. Discussed with cardiology, we have small dose of IV Lasix and will need admission for further workup.    Ephraim Hamburger, MD 10/06/14 2352

## 2014-10-06 NOTE — Progress Notes (Signed)
Primary cardiologist: Hochrein  Chief Complaint: Shortness of breath    HPI:  This is a 80 y.o. female with a past medical history significant for syncope and recurrent episodes of supraventricular tachycardia, sinus bradycardia, chronic left bundle-branch block, coronary artery disease status post non-ST segment elevation myocardial 2010 with evidence of chronic total occlusion of the LAD (well-developed right-to-left collaterals) and unsuccessful PCI. Also noted 60-70% ostial third OM stenosis and 60% distal LCx stenosis beyond the third OM. The NSTEMI occurred in the setting of SVT. She has never had congestive heart failure. At the time of cardiac catheterization in 2010 showed normal left ventricular systolic function and wall motion with an ejection fraction of 60% and left ventricular end-diastolic pressure of only 12 mm Hg.  The clinical history of today's events is a little variable secondary to the patient's memory problems. It is quite apparent that she had a good nights rest, but when she woke up she was dyspneic and her dyspnea worsened as the day progressed. It was present without any exertion. It is questionable whether she also had some chest discomfort. She did feel her heart beating very strong, but not necessarily fast.  There has been a question whether she needs a permanent pacemaker. She actually finished wearing a 30 day event monitor just last week, but the report on that study is not yet available.   She has had multiple recent emergency room visits, usually declining admission. She was in the emergency room on September 10 with an episode of SVT that resolved before she was connected to the monitor. She was hypotensive and her blood pressure improved with fluids. On September 8 she had presented with a mechanical fall injuring her nose and knee. On September 4 she had presented with weakness and a mechanical fall, probably not syncope. On July 16 of this year she had a  syncopal event at the hairdresser. She was brought to the emergency room in normal rhythm but then developed regular supraventricular tachycardia at 130 beats per minute, that resolved.  Additional problems include treated hypothyroidism, hyperlipidemia, moderate right internal carotid artery stenosis (40-59%), left eye glaucoma, history of GI bleeding, esophageal reflux and generalized osteoarthritis.  PMHx:  Past Medical History  Diagnosis Date  . SVT (supraventricular tachycardia)     Diagnosed 2010  . HTN (hypertension)   . Dyslipidemia   . CAD (coronary artery disease)     a. NSTEMI 2010 in setting of SVT, with cath with unsuccessful PCI - chronic total occlusion of LAD, R->L collaterals.  . GI bleeding   . Unspecified hypothyroidism   . Unspecified hereditary and idiopathic peripheral neuropathy   . Pneumonia, organism unspecified   . Basal cell carcinoma of skin of trunk, except scrotum   . Unspecified malignant neoplasm of skin of lower limb, including hip   . Basal cell carcinoma of skin of lower limb, including hip   . Unspecified glaucoma     left eye  . Other premature beats   . Other esophagitis   . Esophageal reflux   . Osteoarthrosis, unspecified whether generalized or localized, unspecified site     multiple joints  . Pain in joint, shoulder region   . Pain in joint, lower leg     knee  . Pain in joint, ankle and foot     right  . Sacroiliitis, not elsewhere classified   . Cervicalgia   . Lumbago   . Ganglion of tendon sheath   . Pathologic fracture of vertebrae   .  Tietze's disease   . Insomnia, unspecified   . Disturbance of skin sensation   . Edema   . Palpitations   . Other nonspecific abnormal serum enzyme levels   . Nasal bones, closed fracture   . Closed fracture of unspecified trochanteric section of femur   . Open wound of knee, leg (except thigh), and ankle, without mention of complication   . Hip, thigh, leg, and ankle, abrasion or friction  burn, without mention of infection   . Contusion of hip   . Fall from other slipping, tripping, or stumbling   . Personal history of fall   . Dyspepsia and other specified disorders of function of stomach   . Paroxysmal supraventricular tachycardia   . Acute bronchiolitis due to other infectious organisms 03/02/2013  . Other abnormal blood chemistry 03/15/2013  . Contusion of rib on left side 08/03/2013    Past Surgical History  Procedure Laterality Date  . Hip surgery  2001  . Breast lumpectomy  1995    right breast, negative for malignancy  . Cataract extraction  2003    bilateral  . Leg skin lesion  biopsy / excision Left 08/22/2010    Lower leg shave biopsy -solar keratosis (Dr. Radford Pax)    FAMHx:  Family History  Problem Relation Age of Onset  . Stroke Neg Hx   . Heart attack Neg Hx   . Heart disease Father   . Cancer Brother     lung    SOCHx:   reports that she has never smoked. She has never used smokeless tobacco. She reports that she drinks alcohol. She reports that she does not use illicit drugs.  ALLERGIES:  No Known Allergies  ROS: This is difficult to obtain secondary to her memory problem Her daughter notes that she has had some episodes of disorientation/sundowning when moved to the rehabilitation portion of her retirement facility. She has shortness of breath at rest and with minimal exertion, unclear she has had chest pain. She does have palpitations. She denies complete syncope recently. She has had minimal ankle swelling. She denies any claudication. She denies change in bowel pattern, bowel pain, nausea-vomiting or dysphagia. She has not had increased frequency or urgency, hematuria or dysuria. She does have frequent urination at night chronically. She denies fever/chills, major weight changes, intolerance to heat or cold, change in voice/hair/skin texture or mood swings.  HOME MEDS: No current facility-administered medications on file prior to encounter.     Current Outpatient Prescriptions on File Prior to Encounter  Medication Sig Dispense Refill  . acetaminophen (TYLENOL) 325 MG tablet Take 650 mg by mouth every 6 (six) hours as needed.      Marland Kitchen aspirin EC 81 MG tablet Take 81 mg by mouth every evening.      Marland Kitchen atorvastatin (LIPITOR) 20 MG tablet Take 20 mg by mouth daily.      . brimonidine (ALPHAGAN) 0.2 % ophthalmic solution Place 1 drop into both eyes 2 (two) times daily.      . celecoxib (CELEBREX) 200 MG capsule TAKE ONE CAPSULE BY MOUTH EVERY DAY  90 capsule  1  . diphenhydrAMINE (SOMINEX) 25 MG tablet Take 25 mg by mouth at bedtime.      Marland Kitchen ezetimibe (ZETIA) 10 MG tablet Take 10 mg by mouth daily.      Marland Kitchen HYDROcodone-acetaminophen (NORCO/VICODIN) 5-325 MG per tablet Take one tablet by mouth every 4 hours as needed for pain  180 tablet  0  . imipramine (TOFRANIL)  25 MG tablet Take 50 mg by mouth at bedtime. To reduce urinary frequency      . latanoprost (XALATAN) 0.005 % ophthalmic solution Place 1 drop into both eyes every evening. UAD      . levothyroxine (SYNTHROID, LEVOTHROID) 50 MCG tablet Take 50 mcg by mouth daily before breakfast.      . Multiple Vitamin (MULTIVITAMIN WITH MINERALS) TABS Take 1 tablet by mouth daily. Certavite-A      . omeprazole (PRILOSEC) 40 MG capsule Take 40 mg by mouth daily.      . pindolol (VISKEN) 5 MG tablet TAKE ONE-HALF TABLET BY MOUTH DAILY  45 tablet  0     (Not in a hospital admission)  LABS/IMAGING: Results for orders placed during the hospital encounter of 10/06/14 (from the past 48 hour(s))  CBC WITH DIFFERENTIAL     Status: None   Collection Time    10/06/14  4:35 PM      Result Value Ref Range   WBC 7.8  4.0 - 10.5 K/uL   RBC 4.10  3.87 - 5.11 MIL/uL   Hemoglobin 13.2  12.0 - 15.0 g/dL   HCT 40.2  36.0 - 46.0 %   MCV 98.0  78.0 - 100.0 fL   MCH 32.2  26.0 - 34.0 pg   MCHC 32.8  30.0 - 36.0 g/dL   RDW 14.7  11.5 - 15.5 %   Platelets 232  150 - 400 K/uL   Neutrophils Relative % 65  43  - 77 %   Neutro Abs 5.0  1.7 - 7.7 K/uL   Lymphocytes Relative 23  12 - 46 %   Lymphs Abs 1.8  0.7 - 4.0 K/uL   Monocytes Relative 8  3 - 12 %   Monocytes Absolute 0.7  0.1 - 1.0 K/uL   Eosinophils Relative 4  0 - 5 %   Eosinophils Absolute 0.3  0.0 - 0.7 K/uL   Basophils Relative 0  0 - 1 %   Basophils Absolute 0.0  0.0 - 0.1 K/uL  COMPREHENSIVE METABOLIC PANEL     Status: Abnormal   Collection Time    10/06/14  4:35 PM      Result Value Ref Range   Sodium 136 (*) 137 - 147 mEq/L   Potassium 4.6  3.7 - 5.3 mEq/L   Chloride 100  96 - 112 mEq/L   CO2 24  19 - 32 mEq/L   Glucose, Bld 79  70 - 99 mg/dL   BUN 20  6 - 23 mg/dL   Creatinine, Ser 0.94  0.50 - 1.10 mg/dL   Calcium 8.9  8.4 - 10.5 mg/dL   Total Protein 7.7  6.0 - 8.3 g/dL   Albumin 3.2 (*) 3.5 - 5.2 g/dL   AST 34  0 - 37 U/L   ALT 29  0 - 35 U/L   Alkaline Phosphatase 137 (*) 39 - 117 U/L   Total Bilirubin 0.6  0.3 - 1.2 mg/dL   GFR calc non Af Amer 51 (*) >90 mL/min   GFR calc Af Amer 59 (*) >90 mL/min   Comment: (NOTE)     The eGFR has been calculated using the CKD EPI equation.     This calculation has not been validated in all clinical situations.     eGFR's persistently <90 mL/min signify possible Chronic Kidney     Disease.   Anion gap 12  5 - 15  TROPONIN I     Status:  None   Collection Time    10/06/14  4:35 PM      Result Value Ref Range   Troponin I <0.30  <0.30 ng/mL   Comment:            Due to the release kinetics of cTnI,     a negative result within the first hours     of the onset of symptoms does not rule out     myocardial infarction with certainty.     If myocardial infarction is still suspected,     repeat the test at appropriate intervals.  PRO B NATRIURETIC PEPTIDE     Status: Abnormal   Collection Time    10/06/14  4:35 PM      Result Value Ref Range   Pro B Natriuretic peptide (BNP) 4426.0 (*) 0 - 450 pg/mL  URINALYSIS, ROUTINE W REFLEX MICROSCOPIC     Status: Abnormal   Collection  Time    10/06/14  6:01 PM      Result Value Ref Range   Color, Urine YELLOW  YELLOW   APPearance CLEAR  CLEAR   Specific Gravity, Urine 1.005  1.005 - 1.030   pH 6.5  5.0 - 8.0   Glucose, UA NEGATIVE  NEGATIVE mg/dL   Hgb urine dipstick NEGATIVE  NEGATIVE   Bilirubin Urine NEGATIVE  NEGATIVE   Ketones, ur NEGATIVE  NEGATIVE mg/dL   Protein, ur NEGATIVE  NEGATIVE mg/dL   Urobilinogen, UA 0.2  0.0 - 1.0 mg/dL   Nitrite NEGATIVE  NEGATIVE   Leukocytes, UA TRACE (*) NEGATIVE  URINE MICROSCOPIC-ADD ON     Status: Abnormal   Collection Time    10/06/14  6:01 PM      Result Value Ref Range   Squamous Epithelial / LPF FEW (*) RARE   WBC, UA 3-6  <3 WBC/hpf   Bacteria, UA RARE  RARE   Dg Chest 2 View  10/06/2014   CLINICAL DATA:  Weakness and palpitations. ; history of MI ; patient is unsure of the length of time of symptoms. ; history of palpitations ; initial visit  EXAM: CHEST  2 VIEW  COMPARISON:  PA and lateral chest of July 14, 2014  FINDINGS: The lungs are mildly hypoinflated. There are new bilateral pleural effusions. The pulmonary interstitial markings are increased. The cardiopericardial silhouette is enlarged. The pulmonary vascularity is engorged. The bony thorax is unremarkable.  IMPRESSION: Congestive heart failure with pulmonary interstitial edema and small bilateral pleural effusions. These findings are new since the previous study.   Electronically Signed   By: David  Martinique   On: 10/06/2014 15:37    VITALS: Blood pressure 179/113, pulse 48, temperature 97.6 F (36.4 C), temperature source Oral, resp. rate 20, SpO2 100.00%.  EXAM:  General: Alert, oriented x3, no distress. She clearly has some short-term memory problems, but does not appear confused Head: no evidence of trauma, PERRL, EOMI, no exophtalmos or lid lag, no myxedema, no xanthelasma; normal ears, nose and oropharynx Neck: Normal jugular venous pulsations and no hepatojugular reflux; brisk carotid pulses without  delay and faint bilateral carotid bruits Chest: Reduced breath sounds and dullness to percussion in both bases, left greater than right, no wheezes or rales are heard  Cardiovascular: normal position and quality of the apical impulse, regular rhythm, normal first heart sound and paradoxically split second heart sounds, no rubs or gallops, no murmur Abdomen: no tenderness or distention, no masses by palpation, no abnormal pulsatility or arterial bruits,  normal bowel sounds, no hepatosplenomegaly Extremities: no clubbing, cyanosis; there is trivial right ankle edema; 2+ radial, ulnar and brachial pulses bilaterally; 2+ right femoral, posterior tibial and dorsalis pedis pulses; 2+ left femoral, posterior tibial and dorsalis pedis pulses; no subclavian or femoral bruits Neurological: grossly nonfocal   IMPRESSION: Acute on chronic (presumably diastolic) heart failure in a patient with known CAD and questionable chest discomfort. ECG is nondiagnostic due to chronic left bundle branch block.  She has a history of both sinus bradycardia and recurrent paroxysmal supraventricular tachycardia with previous recommendation of pacemaker. We'll retrieve the results of her event monitor more morning. Relatively recent normal TSH suggests adequate thyroid hormone supplementation  PLAN: Diuretics Vasodilators to reduce her severely elevated blood pressure Echocardiogram Probably conservative approach to her coronary disease, knowing the history of previously failed PCI to LAD and her advanced age. However, if there is severely reduced left ventricular systolic function, may have to reconsider this. She has a DO NOT RESUSCITATE status   Sanda Klein, MD, [facility] [phone] office [phone] pager  10/06/2014, 6:40 PM

## 2014-10-06 NOTE — ED Notes (Signed)
Attempted report x1. 

## 2014-10-06 NOTE — ED Notes (Signed)
Pt O2 sats around 89%, pt bumped up to 3L [place], tolerating well and states she feels better with the O2 on. 98% at this time.

## 2014-10-06 NOTE — ED Notes (Signed)
Attempted lab draw, will call phlebotomy.

## 2014-10-06 NOTE — ED Notes (Signed)
[facility]- pt is from [facility] has been experiencing CP for over a month. She wore a heart monitor for 21 days. She has had increased weakness X3 days and shortness of breath on exertion.  Pt denied chest pain with EMS. She had 324mg  ASA with EMS.

## 2014-10-06 NOTE — ED Notes (Signed)
Pt up to use restroom, no problems walking but some shortness of breath after returning to bed.

## 2014-10-07 ENCOUNTER — Encounter (HOSPITAL_COMMUNITY): Payer: Self-pay | Admitting: Cardiology

## 2014-10-07 DIAGNOSIS — I447 Left bundle-branch block, unspecified: Secondary | ICD-10-CM

## 2014-10-07 DIAGNOSIS — I2510 Atherosclerotic heart disease of native coronary artery without angina pectoris: Secondary | ICD-10-CM

## 2014-10-07 DIAGNOSIS — I359 Nonrheumatic aortic valve disorder, unspecified: Secondary | ICD-10-CM

## 2014-10-07 LAB — BASIC METABOLIC PANEL
Anion gap: 14 (ref 5–15)
BUN: 19 mg/dL (ref 6–23)
CALCIUM: 8.7 mg/dL (ref 8.4–10.5)
CO2: 25 mEq/L (ref 19–32)
Chloride: 103 mEq/L (ref 96–112)
Creatinine, Ser: 1.02 mg/dL (ref 0.50–1.10)
GFR calc Af Amer: 54 mL/min — ABNORMAL LOW (ref >90)
GFR, EST NON AFRICAN AMERICAN: 46 mL/min — AB (ref >90)
GLUCOSE: 80 mg/dL (ref 70–99)
Potassium: 3.7 mEq/L (ref 3.7–5.3)
Sodium: 142 mEq/L (ref 137–147)

## 2014-10-07 LAB — TROPONIN I
Troponin I: <0.3 ng/mL (ref <0.30)
Troponin I: <0.3 ng/mL (ref <0.30)

## 2014-10-07 MED ORDER — POTASSIUM CHLORIDE ER 10 MEQ PO TBCR
20.0000 meq | EXTENDED_RELEASE_TABLET | Freq: Two times a day (BID) | ORAL | Status: DC
  Administered 2014-10-07 – 2014-10-08 (×3): 20 meq via ORAL
  Filled 2014-10-07 (×4): qty 2

## 2014-10-07 NOTE — H&P (Signed)
Primary cardiologist: Hochrein  Chief Complaint: Shortness of breath    HPI:  This is a 80 y.o. female with a past medical history significant for syncope and recurrent episodes of supraventricular tachycardia, sinus bradycardia, chronic left bundle-branch block, coronary artery disease status post non-ST segment elevation myocardial 2010 with evidence of chronic total occlusion of the LAD (well-developed right-to-left collaterals) and unsuccessful PCI. Also noted 60-70% ostial third OM stenosis and 60% distal LCx stenosis beyond the third OM. The NSTEMI occurred in the setting of SVT. She has never had congestive heart failure. At the time of cardiac catheterization in 2010 showed normal left ventricular systolic function and wall motion with an ejection fraction of 60% and left ventricular end-diastolic pressure of only 12 mm Hg.  The clinical history of today's events is a little variable secondary to the patient's memory problems. It is quite apparent that she had a good nights rest, but when she woke up she was dyspneic and her dyspnea worsened as the day progressed. It was present without any exertion. It is questionable whether she also had some chest discomfort. She did feel her heart beating very strong, but not necessarily fast.  There has been a question whether she needs a permanent pacemaker. She actually finished wearing a 30 day event monitor just last week, but the report on that study is not yet available.   She has had multiple recent emergency room visits, usually declining admission. She was in the emergency room on September 10 with an episode of SVT that resolved before she was connected to the monitor. She was hypotensive and her blood pressure improved with fluids. On September 8 she had presented with a mechanical fall injuring her nose and knee. On September 4 she had presented with weakness and a mechanical fall, probably not syncope. On July 16 of this year she had a  syncopal event at the hairdresser. She was brought to the emergency room in normal rhythm but then developed regular supraventricular tachycardia at 130 beats per minute, that resolved.  Additional problems include treated hypothyroidism, hyperlipidemia, moderate right internal carotid artery stenosis (40-59%), left eye glaucoma, history of GI bleeding, esophageal reflux and generalized osteoarthritis.  PMHx:  Past Medical History  Diagnosis Date  . SVT (supraventricular tachycardia)     Diagnosed 2010  . HTN (hypertension)   . Dyslipidemia   . CAD (coronary artery disease)     a. NSTEMI 2010 in setting of SVT, with cath with unsuccessful PCI - chronic total occlusion of LAD, R->L collaterals.  . GI bleeding   . Unspecified hypothyroidism   . Unspecified hereditary and idiopathic peripheral neuropathy   . Pneumonia, organism unspecified   . Basal cell carcinoma of skin of trunk, except scrotum   . Unspecified malignant neoplasm of skin of lower limb, including hip   . Basal cell carcinoma of skin of lower limb, including hip   . Unspecified glaucoma     left eye  . Other premature beats   . Other esophagitis   . Esophageal reflux   . Osteoarthrosis, unspecified whether generalized or localized, unspecified site     multiple joints  . Pain in joint, shoulder region   . Pain in joint, lower leg     knee  . Pain in joint, ankle and foot     right  . Sacroiliitis, not elsewhere classified   . Cervicalgia   . Lumbago   . Ganglion of tendon sheath   . Pathologic fracture of vertebrae   .   Tietze's disease   . Insomnia, unspecified   . Disturbance of skin sensation   . Edema   . Palpitations   . Other nonspecific abnormal serum enzyme levels   . Nasal bones, closed fracture   . Closed fracture of unspecified trochanteric section of femur   . Open wound of knee, leg (except thigh), and ankle, without mention of complication   . Hip, thigh, leg, and ankle, abrasion or friction  burn, without mention of infection   . Contusion of hip   . Fall from other slipping, tripping, or stumbling   . Personal history of fall   . Dyspepsia and other specified disorders of function of stomach   . Paroxysmal supraventricular tachycardia   . Acute bronchiolitis due to other infectious organisms 03/02/2013  . Other abnormal blood chemistry 03/15/2013  . Contusion of rib on left side 08/03/2013    Past Surgical History  Procedure Laterality Date  . Hip surgery  2001  . Breast lumpectomy  1995    right breast, negative for malignancy  . Cataract extraction  2003    bilateral  . Leg skin lesion  biopsy / excision Left 08/22/2010    Lower leg shave biopsy -solar keratosis (Dr. Turner)    FAMHx:  Family History  Problem Relation Age of Onset  . Stroke Neg Hx   . Heart attack Neg Hx   . Heart disease Father   . Cancer Brother     lung    SOCHx:   reports that she has never smoked. She has never used smokeless tobacco. She reports that she drinks alcohol. She reports that she does not use illicit drugs.  ALLERGIES:  No Known Allergies  ROS: This is difficult to obtain secondary to her memory problem Her daughter notes that she has had some episodes of disorientation/sundowning when moved to the rehabilitation portion of her retirement facility. She has shortness of breath at rest and with minimal exertion, unclear she has had chest pain. She does have palpitations. She denies complete syncope recently. She has had minimal ankle swelling. She denies any claudication. She denies change in bowel pattern, bowel pain, nausea-vomiting or dysphagia. She has not had increased frequency or urgency, hematuria or dysuria. She does have frequent urination at night chronically. She denies fever/chills, major weight changes, intolerance to heat or cold, change in voice/hair/skin texture or mood swings.  HOME MEDS: No current facility-administered medications on file prior to encounter.     Current Outpatient Prescriptions on File Prior to Encounter  Medication Sig Dispense Refill  . acetaminophen (TYLENOL) 325 MG tablet Take 650 mg by mouth every 6 (six) hours as needed.      . aspirin EC 81 MG tablet Take 81 mg by mouth every evening.      . atorvastatin (LIPITOR) 20 MG tablet Take 20 mg by mouth daily.      . brimonidine (ALPHAGAN) 0.2 % ophthalmic solution Place 1 drop into both eyes 2 (two) times daily.      . celecoxib (CELEBREX) 200 MG capsule TAKE ONE CAPSULE BY MOUTH EVERY DAY  90 capsule  1  . diphenhydrAMINE (SOMINEX) 25 MG tablet Take 25 mg by mouth at bedtime.      . ezetimibe (ZETIA) 10 MG tablet Take 10 mg by mouth daily.      . HYDROcodone-acetaminophen (NORCO/VICODIN) 5-325 MG per tablet Take one tablet by mouth every 4 hours as needed for pain  180 tablet  0  . imipramine (TOFRANIL)   25 MG tablet Take 50 mg by mouth at bedtime. To reduce urinary frequency      . latanoprost (XALATAN) 0.005 % ophthalmic solution Place 1 drop into both eyes every evening. UAD      . levothyroxine (SYNTHROID, LEVOTHROID) 50 MCG tablet Take 50 mcg by mouth daily before breakfast.      . Multiple Vitamin (MULTIVITAMIN WITH MINERALS) TABS Take 1 tablet by mouth daily. Certavite-A      . omeprazole (PRILOSEC) 40 MG capsule Take 40 mg by mouth daily.      . pindolol (VISKEN) 5 MG tablet TAKE ONE-HALF TABLET BY MOUTH DAILY  45 tablet  0     (Not in a hospital admission)  LABS/IMAGING: Results for orders placed during the hospital encounter of 10/06/14 (from the past 48 hour(s))  CBC WITH DIFFERENTIAL     Status: None   Collection Time    10/06/14  4:35 PM      Result Value Ref Range   WBC 7.8  4.0 - 10.5 K/uL   RBC 4.10  3.87 - 5.11 MIL/uL   Hemoglobin 13.2  12.0 - 15.0 g/dL   HCT 40.2  36.0 - 46.0 %   MCV 98.0  78.0 - 100.0 fL   MCH 32.2  26.0 - 34.0 pg   MCHC 32.8  30.0 - 36.0 g/dL   RDW 14.7  11.5 - 15.5 %   Platelets 232  150 - 400 K/uL   Neutrophils Relative % 65  43  - 77 %   Neutro Abs 5.0  1.7 - 7.7 K/uL   Lymphocytes Relative 23  12 - 46 %   Lymphs Abs 1.8  0.7 - 4.0 K/uL   Monocytes Relative 8  3 - 12 %   Monocytes Absolute 0.7  0.1 - 1.0 K/uL   Eosinophils Relative 4  0 - 5 %   Eosinophils Absolute 0.3  0.0 - 0.7 K/uL   Basophils Relative 0  0 - 1 %   Basophils Absolute 0.0  0.0 - 0.1 K/uL  COMPREHENSIVE METABOLIC PANEL     Status: Abnormal   Collection Time    10/06/14  4:35 PM      Result Value Ref Range   Sodium 136 (*) 137 - 147 mEq/L   Potassium 4.6  3.7 - 5.3 mEq/L   Chloride 100  96 - 112 mEq/L   CO2 24  19 - 32 mEq/L   Glucose, Bld 79  70 - 99 mg/dL   BUN 20  6 - 23 mg/dL   Creatinine, Ser 0.94  0.50 - 1.10 mg/dL   Calcium 8.9  8.4 - 10.5 mg/dL   Total Protein 7.7  6.0 - 8.3 g/dL   Albumin 3.2 (*) 3.5 - 5.2 g/dL   AST 34  0 - 37 U/L   ALT 29  0 - 35 U/L   Alkaline Phosphatase 137 (*) 39 - 117 U/L   Total Bilirubin 0.6  0.3 - 1.2 mg/dL   GFR calc non Af Amer 51 (*) >90 mL/min   GFR calc Af Amer 59 (*) >90 mL/min   Comment: (NOTE)     The eGFR has been calculated using the CKD EPI equation.     This calculation has not been validated in all clinical situations.     eGFR's persistently <90 mL/min signify possible Chronic Kidney     Disease.   Anion gap 12  5 - 15  TROPONIN I     Status:   None   Collection Time    10/06/14  4:35 PM      Result Value Ref Range   Troponin I <0.30  <0.30 ng/mL   Comment:            Due to the release kinetics of cTnI,     a negative result within the first hours     of the onset of symptoms does not rule out     myocardial infarction with certainty.     If myocardial infarction is still suspected,     repeat the test at appropriate intervals.  PRO B NATRIURETIC PEPTIDE     Status: Abnormal   Collection Time    10/06/14  4:35 PM      Result Value Ref Range   Pro B Natriuretic peptide (BNP) 4426.0 (*) 0 - 450 pg/mL  URINALYSIS, ROUTINE W REFLEX MICROSCOPIC     Status: Abnormal   Collection  Time    10/06/14  6:01 PM      Result Value Ref Range   Color, Urine YELLOW  YELLOW   APPearance CLEAR  CLEAR   Specific Gravity, Urine 1.005  1.005 - 1.030   pH 6.5  5.0 - 8.0   Glucose, UA NEGATIVE  NEGATIVE mg/dL   Hgb urine dipstick NEGATIVE  NEGATIVE   Bilirubin Urine NEGATIVE  NEGATIVE   Ketones, ur NEGATIVE  NEGATIVE mg/dL   Protein, ur NEGATIVE  NEGATIVE mg/dL   Urobilinogen, UA 0.2  0.0 - 1.0 mg/dL   Nitrite NEGATIVE  NEGATIVE   Leukocytes, UA TRACE (*) NEGATIVE  URINE MICROSCOPIC-ADD ON     Status: Abnormal   Collection Time    10/06/14  6:01 PM      Result Value Ref Range   Squamous Epithelial / LPF FEW (*) RARE   WBC, UA 3-6  <3 WBC/hpf   Bacteria, UA RARE  RARE   Dg Chest 2 View  10/06/2014   CLINICAL DATA:  Weakness and palpitations. ; history of MI ; patient is unsure of the length of time of symptoms. ; history of palpitations ; initial visit  EXAM: CHEST  2 VIEW  COMPARISON:  PA and lateral chest of July 14, 2014  FINDINGS: The lungs are mildly hypoinflated. There are new bilateral pleural effusions. The pulmonary interstitial markings are increased. The cardiopericardial silhouette is enlarged. The pulmonary vascularity is engorged. The bony thorax is unremarkable.  IMPRESSION: Congestive heart failure with pulmonary interstitial edema and small bilateral pleural effusions. These findings are new since the previous study.   Electronically Signed   By: David  Jordan   On: 10/06/2014 15:37    VITALS: Blood pressure 179/113, pulse 48, temperature 97.6 F (36.4 C), temperature source Oral, resp. rate 20, SpO2 100.00%.  EXAM:  General: Alert, oriented x3, no distress. She clearly has some short-term memory problems, but does not appear confused Head: no evidence of trauma, PERRL, EOMI, no exophtalmos or lid lag, no myxedema, no xanthelasma; normal ears, nose and oropharynx Neck: Normal jugular venous pulsations and no hepatojugular reflux; brisk carotid pulses without  delay and faint bilateral carotid bruits Chest: Reduced breath sounds and dullness to percussion in both bases, left greater than right, no wheezes or rales are heard  Cardiovascular: normal position and quality of the apical impulse, regular rhythm, normal first heart sound and paradoxically split second heart sounds, no rubs or gallops, no murmur Abdomen: no tenderness or distention, no masses by palpation, no abnormal pulsatility or arterial bruits,   normal bowel sounds, no hepatosplenomegaly Extremities: no clubbing, cyanosis; there is trivial right ankle edema; 2+ radial, ulnar and brachial pulses bilaterally; 2+ right femoral, posterior tibial and dorsalis pedis pulses; 2+ left femoral, posterior tibial and dorsalis pedis pulses; no subclavian or femoral bruits Neurological: grossly nonfocal   IMPRESSION: Acute on chronic (presumably diastolic) heart failure in a patient with known CAD and questionable chest discomfort. ECG is nondiagnostic due to chronic left bundle branch block.  She has a history of both sinus bradycardia and recurrent paroxysmal supraventricular tachycardia with previous recommendation of pacemaker. We'll retrieve the results of her event monitor more morning. Relatively recent normal TSH suggests adequate thyroid hormone supplementation  PLAN: Diuretics Vasodilators to reduce her severely elevated blood pressure Echocardiogram Probably conservative approach to her coronary disease, knowing the history of previously failed PCI to LAD and her advanced age. However, if there is severely reduced left ventricular systolic function, may have to reconsider this. She has a DO NOT RESUSCITATE status   Klayton Monie, MD, FACC [facility] [phone] office [phone] pager  10/06/2014, 6:40 PM  

## 2014-10-07 NOTE — Progress Notes (Signed)
CSW order received that patient is from [facility].  CSW will follow and await determination for possible need for increased level of care. Otherwise- she would return to independent living without need for CSW intervention.  MD:  Please order PT/OT to determine if increased level of care is indicated.  CSW will follow up with [facility]'s Admissions Coordinator as needed if hight level of care is needed.  Lorie Phenix. Pauline Good, [place]

## 2014-10-07 NOTE — Progress Notes (Signed)
Reviewed results of event monitor, now scanned in. 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 On low dose pindolol. Has had syncope in recent past. Probably needs a dual chamber pacemaker. Consider CRT-P since she has LBBB and CHF (but EF near normal). Will ask for EP opinion. Sanda Klein, MD, [facility]) [facility] [phone] office [phone] pager

## 2014-10-07 NOTE — Consult Note (Signed)
Reason for Consult: tachy-brady, prob. Need for PPM   Referring Physician: Dr. Sallyanne Kuster PCP:  Estill Dooms, MD Primary Cardiologist:Dr. Josefa Half [name] is an 80 y.o. female.    Chief Complaint: admitted 10/06/14    HPI: 80 y.o. female with a past medical history significant for syncope and recurrent episodes of supraventricular tachycardia, sinus bradycardia, chronic left bundle-branch block, coronary artery disease status post non-ST segment elevation myocardial 2010 with evidence of chronic total occlusion of the LAD (well-developed right-to-left collaterals) and unsuccessful PCI. Also noted 60-70% ostial third OM stenosis and 60% distal LCx stenosis beyond the third OM. The NSTEMI occurred in the setting of SVT. She has never had congestive heart failure. At the time of cardiac catheterization in 2010 showed normal left ventricular systolic function and wall motion with an ejection fraction of 60% and left ventricular end-diastolic pressure of only 12 mm Hg.   The clinical history of yesterday's events is a little variable secondary to the patient's memory problems. It is quite apparent that she had a good nights rest, but when she woke up she was dyspneic and her dyspnea worsened as the day progressed. It was present without any exertion. It is questionable whether she also had some chest discomfort. She did feel her heart beating very strong, but not necessarily fast.   There has been a question whether she needs a permanent pacemaker. She actually finished wearing a 30 day event monitor just last week, but the report on that study is not yet available.  She has had multiple recent emergency room visits, usually declining admission. She was in the emergency room on September 10 with an episode of SVT that resolved before she was connected to the monitor. She was hypotensive and her blood pressure improved with fluids. On September 8 she had presented with a  mechanical fall injuring her nose and knee. On September 4 she had presented with weakness and a mechanical fall, probably not syncope. On July 16 of this year she had a syncopal event at the hairdresser. She was brought to the emergency room in normal rhythm but then developed regular supraventricular tachycardia at 130 beats per minute, that resolved. On follow up she had labile BP and continued PSVT, we increased her lopressor to 25 mg every 8 hours- then with labile BP her lopressor was replace with pindolol 2.5 mg daily, now increased to 5 mg daily.   Additional problems include treated hypothyroidism, hyperlipidemia, moderate right internal carotid artery stenosis (40-59%), left eye glaucoma, history of GI bleeding, esophageal reflux and generalized osteoarthritis.   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  Dr. Bertrum Sol is asking [place] to see for dual chamber PPM and possible CRT-P with LBBB and CHF but EF 50-55%.  Discussed PPM with pt she is not sure she wants one, and stated that they tried to put one in years ago but they could not.  I believe she is referring to [name] that could not be put in place with NSTEMI, it was chronic occl. And they could not place a stent.      Past Medical History  Diagnosis Date  . SVT (supraventricular tachycardia)     Diagnosed 2010  . HTN (hypertension)   . Dyslipidemia   . CAD (coronary artery disease)     a. NSTEMI 2010  in setting of SVT, with cath with unsuccessful PCI - chronic total occlusion of LAD, R->L collaterals.  . GI bleeding   . Unspecified hypothyroidism   . Unspecified hereditary and idiopathic peripheral neuropathy   . Pneumonia, organism unspecified   . Basal cell carcinoma of skin of trunk, except scrotum   . Unspecified malignant neoplasm of skin of lower limb, including hip   . Basal cell carcinoma of skin of  lower limb, including hip   . Unspecified glaucoma     left eye  . Other premature beats   . Other esophagitis   . Esophageal reflux   . Osteoarthrosis, unspecified whether generalized or localized, unspecified site     multiple joints  . Pain in joint, shoulder region   . Pain in joint, lower leg     knee  . Pain in joint, ankle and foot     right  . Sacroiliitis, not elsewhere classified   . Cervicalgia   . Lumbago   . Ganglion of tendon sheath   . Pathologic fracture of vertebrae   . Tietze's disease   . Insomnia, unspecified   . Disturbance of skin sensation   . Edema   . Palpitations   . Other nonspecific abnormal serum enzyme levels   . Nasal bones, closed fracture   . Closed fracture of unspecified trochanteric section of femur   . Open wound of knee, leg (except thigh), and ankle, without mention of complication   . Hip, thigh, leg, and ankle, abrasion or friction burn, without mention of infection   . Contusion of hip   . Fall from other slipping, tripping, or stumbling   . Personal history of fall   . Dyspepsia and other specified disorders of function of stomach   . Paroxysmal supraventricular tachycardia   . Acute bronchiolitis due to other infectious organisms 03/02/2013  . Other abnormal blood chemistry 03/15/2013  . Contusion of rib on left side 08/03/2013    Past Surgical History  Procedure Laterality Date  . Total hip arthroplasty Left 2001  . Breast lumpectomy Right 1995     negative for malignancy  . Cataract extraction w/ intraocular lens  implant, bilateral Bilateral 2003  . Leg skin lesion  biopsy / excision Left 08/22/2010    Lower leg shave biopsy -solar keratosis (Dr. Radford Pax)  . Joint replacement      Family History  Problem Relation Age of Onset  . Stroke Neg Hx   . Heart attack Neg Hx   . Heart disease Father   . Cancer Brother     lung   Social History:  reports that she has never smoked. She has never used smokeless tobacco. She  reports that she drinks alcohol. She reports that she does not use illicit drugs.  Allergies: No Known Allergies  Medications Prior to Admission  Medication Sig Dispense Refill  . acetaminophen (TYLENOL) 325 MG tablet Take 650 mg by mouth every 6 (six) hours as needed.      Marland Kitchen aspirin EC 81 MG tablet Take 81 mg by mouth every evening.      Marland Kitchen atorvastatin (LIPITOR) 20 MG tablet Take 20 mg by mouth daily.      . brimonidine (ALPHAGAN) 0.2 % ophthalmic solution Place 1 drop into both eyes 2 (two) times daily.      . celecoxib (CELEBREX) 200 MG capsule TAKE ONE CAPSULE BY MOUTH EVERY DAY  90 capsule  1  . diphenhydrAMINE (SOMINEX) 25 MG tablet Take 25  mg by mouth at bedtime.      Marland Kitchen ezetimibe (ZETIA) 10 MG tablet Take 10 mg by mouth daily.      Marland Kitchen HYDROcodone-acetaminophen (NORCO/VICODIN) 5-325 MG per tablet Take one tablet by mouth every 4 hours as needed for pain  180 tablet  0  . imipramine (TOFRANIL) 25 MG tablet Take 50 mg by mouth at bedtime. To reduce urinary frequency      . latanoprost (XALATAN) 0.005 % ophthalmic solution Place 1 drop into both eyes every evening. UAD      . levothyroxine (SYNTHROID, LEVOTHROID) 50 MCG tablet Take 50 mcg by mouth daily before breakfast.      . Multiple Vitamin (MULTIVITAMIN WITH MINERALS) TABS Take 1 tablet by mouth daily. Certavite-A      . omeprazole (PRILOSEC) 40 MG capsule Take 40 mg by mouth daily.      . pindolol (VISKEN) 5 MG tablet TAKE ONE-HALF TABLET BY MOUTH DAILY  45 tablet  0    Results for orders placed during the hospital encounter of 10/06/14 (from the past 48 hour(s))  CBC WITH DIFFERENTIAL     Status: None   Collection Time    10/06/14  4:35 PM      Result Value Ref Range   WBC 7.8  4.0 - 10.5 K/uL   RBC 4.10  3.87 - 5.11 MIL/uL   Hemoglobin 13.2  12.0 - 15.0 g/dL   HCT 40.2  36.0 - 46.0 %   MCV 98.0  78.0 - 100.0 fL   MCH 32.2  26.0 - 34.0 pg   MCHC 32.8  30.0 - 36.0 g/dL   RDW 14.7  11.5 - 15.5 %   Platelets 232  150 - 400  K/uL   Neutrophils Relative % 65  43 - 77 %   Neutro Abs 5.0  1.7 - 7.7 K/uL   Lymphocytes Relative 23  12 - 46 %   Lymphs Abs 1.8  0.7 - 4.0 K/uL   Monocytes Relative 8  3 - 12 %   Monocytes Absolute 0.7  0.1 - 1.0 K/uL   Eosinophils Relative 4  0 - 5 %   Eosinophils Absolute 0.3  0.0 - 0.7 K/uL   Basophils Relative 0  0 - 1 %   Basophils Absolute 0.0  0.0 - 0.1 K/uL  COMPREHENSIVE METABOLIC PANEL     Status: Abnormal   Collection Time    10/06/14  4:35 PM      Result Value Ref Range   Sodium 136 (*) 137 - 147 mEq/L   Potassium 4.6  3.7 - 5.3 mEq/L   Chloride 100  96 - 112 mEq/L   CO2 24  19 - 32 mEq/L   Glucose, Bld 79  70 - 99 mg/dL   BUN 20  6 - 23 mg/dL   Creatinine, Ser 0.94  0.50 - 1.10 mg/dL   Calcium 8.9  8.4 - 10.5 mg/dL   Total Protein 7.7  6.0 - 8.3 g/dL   Albumin 3.2 (*) 3.5 - 5.2 g/dL   AST 34  0 - 37 U/L   ALT 29  0 - 35 U/L   Alkaline Phosphatase 137 (*) 39 - 117 U/L   Total Bilirubin 0.6  0.3 - 1.2 mg/dL   GFR calc non Af Amer 51 (*) >90 mL/min   GFR calc Af Amer 59 (*) >90 mL/min   Comment: (NOTE)     The eGFR has been calculated using the CKD EPI equation.  This calculation has not been validated in all clinical situations.     eGFR's persistently <90 mL/min signify possible Chronic Kidney     Disease.   Anion gap 12  5 - 15  TROPONIN I     Status: None   Collection Time    10/06/14  4:35 PM      Result Value Ref Range   Troponin I <0.30  <0.30 ng/mL   Comment:            Due to the release kinetics of cTnI,     a negative result within the first hours     of the onset of symptoms does not rule out     myocardial infarction with certainty.     If myocardial infarction is still suspected,     repeat the test at appropriate intervals.  PRO B NATRIURETIC PEPTIDE     Status: Abnormal   Collection Time    10/06/14  4:35 PM      Result Value Ref Range   Pro B Natriuretic peptide (BNP) 4426.0 (*) 0 - 450 pg/mL  URINALYSIS, ROUTINE W REFLEX  MICROSCOPIC     Status: Abnormal   Collection Time    10/06/14  6:01 PM      Result Value Ref Range   Color, Urine YELLOW  YELLOW   APPearance CLEAR  CLEAR   Specific Gravity, Urine 1.005  1.005 - 1.030   pH 6.5  5.0 - 8.0   Glucose, UA NEGATIVE  NEGATIVE mg/dL   Hgb urine dipstick NEGATIVE  NEGATIVE   Bilirubin Urine NEGATIVE  NEGATIVE   Ketones, ur NEGATIVE  NEGATIVE mg/dL   Protein, ur NEGATIVE  NEGATIVE mg/dL   Urobilinogen, UA 0.2  0.0 - 1.0 mg/dL   Nitrite NEGATIVE  NEGATIVE   Leukocytes, UA TRACE (*) NEGATIVE  URINE MICROSCOPIC-ADD ON     Status: Abnormal   Collection Time    10/06/14  6:01 PM      Result Value Ref Range   Squamous Epithelial / LPF FEW (*) RARE   WBC, UA 3-6  <3 WBC/hpf   Bacteria, UA RARE  RARE  TROPONIN I     Status: None   Collection Time    10/06/14 10:08 PM      Result Value Ref Range   Troponin I <0.30  <0.30 ng/mL   Comment:            Due to the release kinetics of cTnI,     a negative result within the first hours     of the onset of symptoms does not rule out     myocardial infarction with certainty.     If myocardial infarction is still suspected,     repeat the test at appropriate intervals.  BASIC METABOLIC PANEL     Status: Abnormal   Collection Time    10/07/14  4:30 AM      Result Value Ref Range   Sodium 142  137 - 147 mEq/L   Potassium 3.7  3.7 - 5.3 mEq/L   Comment: DELTA CHECK NOTED   Chloride 103  96 - 112 mEq/L   CO2 25  19 - 32 mEq/L   Glucose, Bld 80  70 - 99 mg/dL   BUN 19  6 - 23 mg/dL   Creatinine, Ser 1.02  0.50 - 1.10 mg/dL   Calcium 8.7  8.4 - 10.5 mg/dL   GFR calc non Af Amer 46 (*) >90 mL/min  GFR calc Af Amer 54 (*) >90 mL/min   Comment: (NOTE)     The eGFR has been calculated using the CKD EPI equation.     This calculation has not been validated in all clinical situations.     eGFR's persistently <90 mL/min signify possible Chronic Kidney     Disease.   Anion gap 14  5 - 15  TROPONIN I     Status: None    Collection Time    10/07/14  4:30 AM      Result Value Ref Range   Troponin I <0.30  <0.30 ng/mL   Comment:            Due to the release kinetics of cTnI,     a negative result within the first hours     of the onset of symptoms does not rule out     myocardial infarction with certainty.     If myocardial infarction is still suspected,     repeat the test at appropriate intervals.  TROPONIN I     Status: None   Collection Time    10/07/14  9:44 AM      Result Value Ref Range   Troponin I <0.30  <0.30 ng/mL   Comment:            Due to the release kinetics of cTnI,     a negative result within the first hours     of the onset of symptoms does not rule out     myocardial infarction with certainty.     If myocardial infarction is still suspected,     repeat the test at appropriate intervals.   Dg Chest 2 View  10/06/2014   CLINICAL DATA:  Weakness and palpitations. ; history of MI ; patient is unsure of the length of time of symptoms. ; history of palpitations ; initial visit  EXAM: CHEST  2 VIEW  COMPARISON:  PA and lateral chest of July 14, 2014  FINDINGS: The lungs are mildly hypoinflated. There are new bilateral pleural effusions. The pulmonary interstitial markings are increased. The cardiopericardial silhouette is enlarged. The pulmonary vascularity is engorged. The bony thorax is unremarkable.  IMPRESSION: Congestive heart failure with pulmonary interstitial edema and small bilateral pleural effusions. These findings are new since the previous study.   Electronically Signed   By: Lisa  [name]   On: 10/06/2014 15:37    ROS: General:no colds or fevers, no weight changes Skin:no rashes or ulcers HEENT:no blurred vision, no congestion CV:see HPI PUL:see HPI GI:no diarrhea constipation or melena, no indigestion GU:no hematuria, no dysuria MS:no joint pain, no claudication Neuro: syncope in July but none since, no lightheadedness Endo:no diabetes, no thyroid disease   Blood  pressure 141/59, pulse 68, temperature 97.7 F (36.5 C), temperature source Oral, resp. rate 22, height 5' 6"  (1.676 m), weight 142 lb 3.2 oz (64.5 kg), SpO2 92.00%. PE: General:Pleasant affect, NAD Skin:Warm and dry, brisk capillary refill, + bruising HEENT:normocephalic, sclera clear, mucus membranes moist Neck:supple, no JVD, faint bruits, no adenopathy, no thyromegaly   Heart:S1S2 RRR without murmur, gallup, rub or click Lungs:clear without rales, rhonchi, or wheezes RCV:ELFY, non tender, + BS, do not palpate liver spleen or masses Ext:no lower ext edema, 2+ pedal pulses, 2+ radial pulses Neuro:alert and oriented, MAE, follows commands, + facial symmetry    Assessment/Plan Principal Problem:   Acute on chronic diastolic heart failure- improving, -1260 overnight Active Problems:   HTN (hypertension)- freq labile.  LBBB (left bundle branch block)   CAD (coronary artery disease) failed [name] several years ago   PSVT (paroxysmal supraventricular tachycardia)- Tachy brady syndrome- Dr. Lovena Le to see to eval for PPM and possible CRT-P - Pt not sure she wants pacer, but not sure she understands how it would help. I explained it to her,  Her daughter with health care power of attorney will be by soon.   Sinus bradycardia- HR in 40s on tele.  Dr. Lovena Le to see for further recommendations.   Acute CHF    [facility] R  Nurse Practitioner Certified [place] Pager [phone] or after 5pm or weekends call [phone] 10/07/2014, 12:08 PM EP Attending  Patient seen and examined. I have reviewed the findings as documented by Cecilie Kicks, PA-C. The patient has both PSVT and bradycardia. However, it is not clear that she is symptomatic from her bradycardia. In addition, she has multiple comorbidities and is not currently inclined to consider PPM. Most of her bradycardia is nocturnal. I would recommend she be observed overnight and be allowed to be discharged if not  symptomatic overnight.   Mikle Bosworth.D.

## 2014-10-07 NOTE — Progress Notes (Signed)
  Echocardiogram 2D Echocardiogram has been performed.  Joelene Millin 10/07/2014, 8:54 AM

## 2014-10-07 NOTE — Consult Note (Signed)
  EP Consult Note  Dictated note to follow.  A/P 1. Tachybrady syndrome, 2. Syncope, not clear if due to #1 3. Acute on chronic diastolic CHF Rec: observe overnight on tele and diurese. Would not place PPM at this time. I would like to see her as an outpatient in 3-4 weeks. If she has symptomatic bradycardia overnight then she will need to stay for PPM.  Mikle Bosworth.D.

## 2014-10-07 NOTE — Progress Notes (Signed)
Patient Name: [name] Date of Encounter: 10/07/2014  Principal Problem:   Acute on chronic diastolic heart failure Active Problems:   HTN (hypertension)   LBBB (left bundle branch block)   CAD (coronary artery disease)   PSVT (paroxysmal supraventricular tachycardia)   Sinus bradycardia   Acute CHF   Length of Stay: 1  SUBJECTIVE  Feels much better. No longer requiring oxygen. Net diuresis greater than 1.2 L No bradycardia arrhythmia on monitor. Had a 4 minute run of atrial tachycardia in the middle of the night.  CURRENT MEDS . aspirin EC  81 mg Oral QPM  . atorvastatin  20 mg Oral q1800  . brimonidine  1 drop Both Eyes BID  . ezetimibe  10 mg Oral Daily  . heparin  5,000 Units Subcutaneous 3 times per day  . imipramine  50 mg Oral QHS  . latanoprost  1 drop Both Eyes QPM  . levothyroxine  50 mcg Oral QAC breakfast  . lisinopril  10 mg Oral Daily  . multivitamin with minerals  1 tablet Oral Daily  . pantoprazole  40 mg Oral Daily  . pindolol  5 mg Oral Daily  . potassium chloride  20 mEq Oral BID  . sodium chloride  3 mL Intravenous Q12H  . zolpidem  5 mg Oral QHS    OBJECTIVE   Intake/Output Summary (Last 24 hours) at 10/07/14 0901 Last data filed at 10/07/14 0818  Gross per 24 hour  Intake    240 ml  Output   1700 ml  Net  -1460 ml   Filed Weights   10/06/14 2034 10/07/14 0545  Weight: 65.5 kg (144 lb 6.4 oz) 64.5 kg (142 lb 3.2 oz)    PHYSICAL EXAM Filed Vitals:   10/06/14 1938 10/06/14 2034 10/07/14 0236 10/07/14 0545  BP: 173/76 165/82 154/68 160/73  Pulse:  57 52 68  Temp:  97.3 F (36.3 C) 97.8 F (36.6 C) 97.7 F (36.5 C)  TempSrc:  Oral Oral Oral  Resp: 20 20 22 22   Height:  5\' 6"  (1.676 m)    Weight:  65.5 kg (144 lb 6.4 oz)  64.5 kg (142 lb 3.2 oz)  SpO2: 100% 96% 96% 92%   General: Alert, oriented x3, no distress Head: no evidence of trauma, PERRL, EOMI, no exophtalmos or lid lag, no myxedema, no xanthelasma; normal ears,  nose and oropharynx Neck: 3-4 cm elevation in jugular venous pulsations and no hepatojugular reflux; brisk carotid pulses without delay and no carotid bruits Chest: Breath sounds diminished at bases, but otherwise clear to auscultation, no signs of consolidation by percussion or palpation, normal fremitus, symmetrical and full respiratory excursions Cardiovascular: normal position and quality of the apical impulse, regular rhythm, normal first and second heart sounds, no rubs or gallops, no murmur Abdomen: no tenderness or distention, no masses by palpation, no abnormal pulsatility or arterial bruits, normal bowel sounds, no hepatosplenomegaly Extremities: no clubbing, cyanosis or edema; 2+ radial, ulnar and brachial pulses bilaterally; 2+ right femoral, posterior tibial and dorsalis pedis pulses; 2+ left femoral, posterior tibial and dorsalis pedis pulses; no subclavian or femoral bruits Neurological: grossly nonfocal  LABS  CBC  Recent Labs  10/06/14 1635  WBC 7.8  NEUTROABS 5.0  HGB 13.2  HCT 40.2  MCV 98.0  PLT 725   Basic Metabolic Panel  Recent Labs  10/06/14 1635 10/07/14 0430  NA 136* 142  K 4.6 3.7  CL 100 103  CO2 24 25  GLUCOSE 79  80  BUN 20 19  CREATININE 0.94 1.02  CALCIUM 8.9 8.7   Liver Function Tests  Recent Labs  10/06/14 1635  AST 34  ALT 29  ALKPHOS 137*  BILITOT 0.6  PROT 7.7  ALBUMIN 3.2*   No results found for this basename: LIPASE, AMYLASE,  in the last 72 hours Cardiac Enzymes  Recent Labs  10/06/14 1635 10/06/14 2208 10/07/14 0430  TROPONINI <0.30 <0.30 <0.30  Radiology Studies Imaging results have been reviewed and Dg Chest 2 View  10/06/2014   CLINICAL DATA:  Weakness and palpitations. ; history of MI ; patient is unsure of the length of time of symptoms. ; history of palpitations ; initial visit  EXAM: CHEST  2 VIEW  COMPARISON:  PA and lateral chest of July 14, 2014  FINDINGS: The lungs are mildly hypoinflated. There are new  bilateral pleural effusions. The pulmonary interstitial markings are increased. The cardiopericardial silhouette is enlarged. The pulmonary vascularity is engorged. The bony thorax is unremarkable.  IMPRESSION: Congestive heart failure with pulmonary interstitial edema and small bilateral pleural effusions. These findings are new since the previous study.   Electronically Signed   By: David  Martinique   On: 10/06/2014 15:37    TELE Normal sinus rhythm. She had a 4 minute episode of sustained atrial tachycardia at about 1:35 AM ECG Sinus rhythm, left bundle branch block (chronic)  ASSESSMENT AND PLAN Substantial clinical improvement after more than 1 L net diuresis. After this morning's dose of diuretic will switch to oral diuretics. May be ready for discharge later today or early tomorrow morning. Will review echo when available. Favor conservative management in view of her advanced age and early dementia. May have to reevaluate this if left ventricular systolic function is markedly depressed. Her SVT is not a new occurrence. She has been receiving pindolol due to concerns about bradycardia. Need to retrieve the results of her 30 day event monitor from the office. Blood pressure is better with addition of ACE inhibitor. Normal renal function despite diuresis. Potassium low normal, with supplements.  Sanda Klein, MD, [facility] [phone] office [phone] pager 10/07/2014 9:01 AM

## 2014-10-07 NOTE — Progress Notes (Signed)
Nutrition Brief Note  Patient identified on the Malnutrition Screening Tool (MST) Report  Wt Readings from Last 15 Encounters:  10/07/14 142 lb 3.2 oz (64.5 kg)  09/09/14 138 lb 9.6 oz (62.869 kg)  09/10/14 139 lb 9.6 oz (63.322 kg)  09/08/14 148 lb (67.132 kg)  08/03/14 139 lb 9.6 oz (63.322 kg)  07/18/14 138 lb (62.596 kg)  05/31/14 138 lb (62.596 kg)  05/25/14 139 lb (63.05 kg)  05/09/14 141 lb 6.4 oz (64.139 kg)  04/25/14 141 lb (63.957 kg)  04/14/14 139 lb 6.4 oz (63.231 kg)  04/04/14 141 lb (63.957 kg)  02/14/14 143 lb (64.864 kg)  02/07/14 142 lb (64.411 kg)  12/27/13 148 lb (67.132 kg)    Body mass index is 22.96 kg/(m^2). Patient meets criteria for normal body weight based on current BMI.   Current diet order is heart healthy, patient is consuming approximately 100% of meals at this time. Labs and medications reviewed.   No nutrition interventions warranted at this time. If nutrition issues arise, please consult RD.   Terrace Arabia RD, LDN

## 2014-10-08 DIAGNOSIS — I495 Sick sinus syndrome: Secondary | ICD-10-CM

## 2014-10-08 LAB — BASIC METABOLIC PANEL
Anion gap: 11 (ref 5–15)
BUN: 20 mg/dL (ref 6–23)
CO2: 24 mEq/L (ref 19–32)
Calcium: 8.6 mg/dL (ref 8.4–10.5)
Chloride: 102 mEq/L (ref 96–112)
Creatinine, Ser: 1.08 mg/dL (ref 0.50–1.10)
GFR calc non Af Amer: 43 mL/min — ABNORMAL LOW (ref >90)
GFR, EST AFRICAN AMERICAN: 50 mL/min — AB (ref >90)
GLUCOSE: 87 mg/dL (ref 70–99)
POTASSIUM: 4.8 meq/L (ref 3.7–5.3)
SODIUM: 137 meq/L (ref 137–147)

## 2014-10-08 MED ORDER — LISINOPRIL 10 MG PO TABS: 10.0000 mg | ORAL_TABLET | Freq: Every day | ORAL | Status: DC

## 2014-10-08 MED ORDER — FUROSEMIDE 20 MG PO TABS: 20.0000 mg | ORAL_TABLET | Freq: Every day | ORAL | Status: DC

## 2014-10-08 NOTE — Discharge Summary (Signed)
Discharge Summary   [name] MRN: [id number], DOB/AGE: Oct 25, 1920 80 y.o. Admit date: 10/06/2014 D/C date:     10/08/2014  Primary Cardiologist:  Dr. Percival Spanish /Dr. Lovena Le  Principal Problem:   Tachycardia-bradycardia syndrome Active Problems:   HTN (hypertension)   Acute on chronic diastolic heart failure   LBBB (left bundle branch block)   CAD (coronary artery disease)   PSVT (paroxysmal supraventricular tachycardia)   Sinus bradycardia   Acute CHF    Admission Dates: 10/06/14-10/08/14 Discharge Diagnosis: Tachy-brady syndrome- admitted for observation. Pacemaker not felt indicated at this time  HPI: [name] is a 80 y.o. female with a history of dementia, CAD, HTN, mild AS, chronic diastolic CHF and tachybrady syndrome with SVT who presented to the [facility] ED with dyspnea and concern for need of pacemaker placement.   She has a past medical history significant for syncope and recurrent episodes of supraventricular tachycardia, sinus bradycardia, chronic left bundle-branch block, coronary artery disease status post NSTEMI 2010 with evidence of chronic total occlusion of the LAD (well-developed right-to-left collaterals) and unsuccessful PCI. Also noted 60-70% ostial third OM stenosis and 60% distal LCx stenosis beyond the third OM. The NSTEMI occurred in the setting of SVT. She has never had congestive heart failure. At the time of cardiac catheterization in 2010 showed normal left ventricular systolic function and wall motion with an ejection fraction of 60% and left ventricular end-diastolic pressure of only 12 mm Hg.  The clinical history of yesterday's events (prior to admission) is a little variable secondary to the patient's memory problems. It is quite apparent that she had a good nights rest, but when she woke up she was dyspneic and her dyspnea worsened as the day progressed. It was present without any exertion. It is questionable whether she also  had some chest discomfort. She did feel her heart beating very strong, but not necessarily fast.  There has been a question whether she needs a permanent pacemaker. She actually finished wearing a 30 day event monitor just last week, which reported PSVT and pauses with junctional or idioventricular rhythm as slow as 30 bpm to SVT 140-150, probably ectopic atrial tachycardia. Dr. Bertrum Sol is asking [place] to see for dual chamber PPM and possible CRT-P with LBBB and CHF but EF 50-55%. .  She has had multiple recent emergency room visits, usually declining admission. She was in the emergency room on September 10 with an episode of SVT that resolved before she was connected to the monitor. She was hypotensive and her blood pressure improved with fluids. On September 8 she had presented with a mechanical fall injuring her nose and knee. On September 4 she had presented with weakness and a mechanical fall, probably not syncope. On July 16 of this year she had a syncopal event at the hairdresser. She was brought to the emergency room in normal rhythm but then developed regular supraventricular tachycardia at 130 beats per minute, that resolved. On follow up she had labile BP and continued PSVT, we increased her lopressor to 25 mg every 8 hours- then with labile BP her lopressor was replace with pindolol 2.5 mg daily, now increased to 5 mg daily. Additional problems include treated hypothyroidism, hyperlipidemia, moderate right internal carotid artery stenosis (40-59%), left eye glaucoma, history of GI bleeding, esophageal reflux and generalized osteoarthritis.   Hospital Course  Tachycardia-bradycardia syndrome. She was evaluated by Dr. Lovena Le, felt not to require pacemaker at this time, although per his chart  note would like to see her back in the office in the next few weeks for further discussion. --Telemetry does show sinus bradycardia and some junctional rhythm, interspersed with bursts of SVT. She has had no  syncope or dizziness under observation. Presently on pindolol.  -- Continue pindolol   Acute on chronic diastolic CHF- BNP 4K and CXR with CHF with pulmonary interstitial edema and small  bilateral pleural effusions.  -- She was given IV lasix and her dyspnea improved. Net neg 1.5L -- ECHO on this admission with EF 50-55% -- Will send home with 20mg  Lasix po qd. Will need a repeat BMET at follow up. Her creat is stable at 1.08  Mild aortic stenosis- Repeat ECHO this admission with Valve area (VTI): 1.77cm^2. Valve area (Vmax): 1.67 cm^2. Valve area (Vmean): 1.71 cm^2.  CAD known chronic total occlusion of the LAD with right-to-left collaterals, NSTEMI in 2010.   Essential hypertension- severely elevated upon admission. Now stable but remains high. Lisinopril 10mg  added to her regimen as well as lasix 20mg .    The patient has had an uncomplicated hospital course and is recovering well. She has been seen by Dr. Domenic Polite  today and deemed ready for discharge home. A voicemail was left to the office to call the patient for follow up with Dr Lovena Le in 3-4 weeks.  Discharge medications are listed below.     Discharge Vitals: Blood pressure 173/80, pulse 58, temperature 97.9 F (36.6 C), temperature source Oral, resp. rate 18, height 5\' 6"  (1.676 m), weight 141 lb 11.2 oz (64.275 kg), SpO2 93.00%.  Labs: Lab Results  Component Value Date   WBC 7.8 10/06/2014   HGB 13.2 10/06/2014   HCT 40.2 10/06/2014   MCV 98.0 10/06/2014   PLT 232 10/06/2014    Recent Labs Lab 10/06/14 1635  10/08/14 0325  NA 136*  < > 137  K 4.6  < > 4.8  CL 100  < > 102  CO2 24  < > 24  BUN 20  < > 20  CREATININE 0.94  < > 1.08  CALCIUM 8.9  < > 8.6  PROT 7.7  --   --   BILITOT 0.6  --   --   ALKPHOS 137*  --   --   ALT 29  --   --   AST 34  --   --   GLUCOSE 79  < > 87  < > = values in this interval not displayed.  Recent Labs  10/06/14 1635 10/06/14 2208 10/07/14 0430 10/07/14 0944  TROPONINI <0.30  <0.30 <0.30 <0.30   Lab Results  Component Value Date   CHOL 129 03/24/2014   HDL 29* 03/24/2014   LDLCALC 69 03/24/2014   TRIG 117 03/24/2014     Diagnostic Studies/Procedures   Dg Chest 2 View  10/06/2014   CLINICAL DATA:  Weakness and palpitations. ; history of MI ; patient is unsure of the length of time of symptoms. ; history of palpitations ; initial visit  EXAM: CHEST  2 VIEW  COMPARISON:  PA and lateral chest of July 14, 2014  FINDINGS: The lungs are mildly hypoinflated. There are new bilateral pleural effusions. The pulmonary interstitial markings are increased. The cardiopericardial silhouette is enlarged. The pulmonary vascularity is engorged. The bony thorax is unremarkable.  IMPRESSION: Congestive heart failure with pulmonary interstitial edema and small bilateral pleural effusions. These findings are new since the previous study.   2D ECHO 10/07/14: - Left ventricle: Inferobasal hypokinesis.  The cavity size was normal. Wall thickness was increased in a pattern of moderate LVH. Systolic function was normal. The estimated ejection fraction was in the range of 50% to 55%. Doppler parameters are consistent with both elevated ventricular end-diastolic filling pressure and elevated left atrial filling pressure. - Aortic valve: There was mild stenosis. Valve area (VTI): 1.77 cm^2. Valve area (Vmax): 1.67 cm^2. Valve area (Vmean): 1.71 cm^2. - Mitral valve: Calcified annulus. Mildly thickened leaflets . Valve area by continuity equation (using LVOT flow): 2.52 cm^2. - Left atrium: The atrium was mildly dilated. - Right atrium: The atrium was mildly dilated. - Atrial septum: No defect or patent foramen ovale was identified. - Pericardium, extracardiac: A trivial pericardial effusion was identified posterior to the heart. There was a left pleural effusion.   Discharge Medications     Medication List         acetaminophen 325 MG tablet  Commonly known as:  TYLENOL  Take 650  mg by mouth every 6 (six) hours as needed.     aspirin EC 81 MG tablet  Take 81 mg by mouth every evening.     atorvastatin 20 MG tablet  Commonly known as:  LIPITOR  Take 20 mg by mouth daily.     brimonidine 0.2 % ophthalmic solution  Commonly known as:  ALPHAGAN  Place 1 drop into both eyes 2 (two) times daily.     celecoxib 200 MG capsule  Commonly known as:  CELEBREX  TAKE ONE CAPSULE BY MOUTH EVERY DAY     diphenhydrAMINE 25 MG tablet  Commonly known as:  SOMINEX  Take 25 mg by mouth at bedtime.     ezetimibe 10 MG tablet  Commonly known as:  ZETIA  Take 10 mg by mouth daily.     furosemide 20 MG tablet  Commonly known as:  LASIX  Take 1 tablet (20 mg total) by mouth daily.     HYDROcodone-acetaminophen 5-325 MG per tablet  Commonly known as:  NORCO/VICODIN  Take one tablet by mouth every 4 hours as needed for pain     imipramine 25 MG tablet  Commonly known as:  TOFRANIL  Take 50 mg by mouth at bedtime. To reduce urinary frequency     latanoprost 0.005 % ophthalmic solution  Commonly known as:  XALATAN  Place 1 drop into both eyes every evening. UAD     levothyroxine 50 MCG tablet  Commonly known as:  SYNTHROID, LEVOTHROID  Take 50 mcg by mouth daily before breakfast.     lisinopril 10 MG tablet  Commonly known as:  PRINIVIL,ZESTRIL  Take 1 tablet (10 mg total) by mouth daily.     multivitamin with minerals Tabs tablet  Take 1 tablet by mouth daily. Certavite-A     omeprazole 40 MG capsule  Commonly known as:  PRILOSEC  Take 40 mg by mouth daily.     pindolol 5 MG tablet  Commonly known as:  VISKEN  TAKE ONE-HALF TABLET BY MOUTH DAILY        Disposition   The patient will be discharged in stable condition to home.  Follow-up Information   Follow up with Cristopher Peru, MD. (The office will call you to make an appoinment., If you do not hear from them, please contact them. You should be seen within 3-4 weeks or sooner if you have more symptoms)     Specialty:  Cardiology   Contact information:   [address]  [address] [phone]         Duration of Discharge Encounter: Greater than 30 minutes including physician and PA time.  Mable Fill R PA-C 10/08/2014, 12:05 PM

## 2014-10-08 NOTE — Progress Notes (Addendum)
Primary cardiologist: Dr. Minus Breeding  Subjective:   Sitting in chair. States that she slept well. No dizziness or syncope.   Objective:   Temp:  [97.7 F (36.5 C)-98.3 F (36.8 C)] 97.9 F (36.6 C) (10/10 0558) Pulse Rate:  [54-72] 58 (10/10 0558) Resp:  [18-20] 18 (10/10 0558) BP: (141-173)/(57-80) 173/80 mmHg (10/10 0558) SpO2:  [93 %-95 %] 93 % (10/10 0558) Weight:  [141 lb 11.2 oz (64.275 kg)] 141 lb 11.2 oz (64.275 kg) (10/10 0558) Last BM Date:  (can't recall last BM)  Filed Weights   10/06/14 2034 10/07/14 0545 10/08/14 0558  Weight: 144 lb 6.4 oz (65.5 kg) 142 lb 3.2 oz (64.5 kg) 141 lb 11.2 oz (64.275 kg)    Intake/Output Summary (Last 24 hours) at 10/08/14 1056 Last data filed at 10/08/14 0700  Gross per 24 hour  Intake    720 ml  Output    875 ml  Net   -155 ml    Telemetry: Sinus rhythm and sinus bradycardia, some junctional rhythm with PVCs. No pauses. Bursts of SVT.  Exam:  General: Appears comfortable.  Lungs: Clear, nonlabored.  Cardiac: RRR, no gallop. 2/6 systolic murmur at the base.  Extremities: Mild edema.   Lab Results:  Basic Metabolic Panel:  Recent Labs Lab 10/06/14 1635 10/07/14 0430 10/08/14 0325  NA 136* 142 137  K 4.6 3.7 4.8  CL 100 103 102  CO2 24 25 24   GLUCOSE 79 80 87  BUN 20 19 20   CREATININE 0.94 1.02 1.08  CALCIUM 8.9 8.7 8.6    Liver Function Tests:  Recent Labs Lab 10/06/14 1635  AST 34  ALT 29  ALKPHOS 137*  BILITOT 0.6  PROT 7.7  ALBUMIN 3.2*    CBC:  Recent Labs Lab 10/06/14 1635  WBC 7.8  HGB 13.2  HCT 40.2  MCV 98.0  PLT 232    Cardiac Enzymes:  Recent Labs Lab 10/06/14 2208 10/07/14 0430 10/07/14 0944  TROPONINI <0.30 <0.30 <0.30    BNP:  Recent Labs  10/06/14 1635  PROBNP 4426.0*    Echocardiogram (10/07/14):  Study Conclusions  - Left ventricle: Inferobasal hypokinesis. The cavity size was normal. Wall thickness was increased in a pattern of  moderate LVH. Systolic function was normal. The estimated ejection fraction was in the range of 50% to 55%. Doppler parameters are consistent with both elevated ventricular end-diastolic filling pressure and elevated left atrial filling pressure. - Aortic valve: There was mild stenosis. Valve area (VTI): 1.77 cm^2. Valve area (Vmax): 1.67 cm^2. Valve area (Vmean): 1.71 cm^2. - Mitral valve: Calcified annulus. Mildly thickened leaflets . Valve area by continuity equation (using LVOT flow): 2.52 cm^2. - Left atrium: The atrium was mildly dilated. - Right atrium: The atrium was mildly dilated. - Atrial septum: No defect or patent foramen ovale was identified. - Pericardium, extracardiac: A trivial pericardial effusion was identified posterior to the heart. There was a left pleural effusion.   Medications:   Scheduled Medications: . aspirin EC  81 mg Oral QPM  . atorvastatin  20 mg Oral q1800  . brimonidine  1 drop Both Eyes BID  . ezetimibe  10 mg Oral Daily  . heparin  5,000 Units Subcutaneous 3 times per day  . imipramine  50 mg Oral QHS  . latanoprost  1 drop Both Eyes QPM  . levothyroxine  50 mcg Oral QAC breakfast  . lisinopril  10 mg Oral Daily  . multivitamin with minerals  1  tablet Oral Daily  . pantoprazole  40 mg Oral Daily  . pindolol  5 mg Oral Daily  . potassium chloride  20 mEq Oral BID  . sodium chloride  3 mL Intravenous Q12H  . zolpidem  5 mg Oral QHS     PRN Medications:  sodium chloride, acetaminophen, HYDROcodone-acetaminophen, ondansetron (ZOFRAN) IV, sodium chloride   Assessment:   1. Tachycardia-bradycardia syndrome. She was evaluated by Dr. Lovena Le, felt not to require pacemaker at this time, although per his chart note would like to see her back in the office in the next few weeks for further discussion. Telemetry does show sinus bradycardia and some junctional rhythm, interspersed with bursts of SVT. She has had no syncope or dizziness under  observation. Presently on pindolol. She would like to go home.  2. Mild aortic stenosis.  3. CAD, known chronic total occlusion of the LAD with right-to-left collaterals, NSTEMI in 2010.  4. Essential hypertension.   Plan/Discussion:    Patient would like to go home today. I reviewed Dr. Tanna Furry note and recommendations. For now, no change in current medications, continue pindolol. Adding low dose Lasix as outpatient - will need followup BMET. She needs to see Dr. Lovena Le back in the office in the next 2-3 weeks, sooner if symptoms escalate.   Satira Sark, M.D., F.A.C.C.

## 2014-10-08 NOTE — Discharge Summary (Signed)
Please see rounding note.

## 2014-10-08 NOTE — Evaluation (Signed)
Physical Therapy Evaluation Patient Details Name: [name] MRN: [id number] DOB: Apr 05, 1920 Today's Date: 10/08/2014   History of Present Illness  Pt is a 80 y.o. female with a PMH significant for syncope and recurrent episodes of supraventricular tachycardia, sinus bradycardia, chronic left bundle-branch block, coronary artery disease status post non-ST segment elevation myocardial 2010 with evidence of chronic total occlusion of the LAD and unsuccessful PCI. The clinical history of latest events is a little variable secondary to the patient's memory problems. It is quite apparent that she had a good nights rest, but when she woke up she was dyspneic and her dyspnea worsened as the day progressed. It was present without any exertion. It is questionable whether she also had some chest discomfort. She did feel her heart beating very strong, but not necessarily fast. She has had multiple recent emergency room visits, usually declining admission.   Clinical Impression  Pt admitted with the above. Pt currently with functional limitations due to the deficits listed below (see PT Problem List). At the time of PT eval pt was able to perform transfers and ambulation with minimal assist/min guard. Pt will benefit from skilled PT to increase their independence and safety with mobility to allow discharge to the venue listed below. Pt already has HHPT set up after d/c.      Follow Up Recommendations Home health PT;Supervision for mobility/OOB    Equipment Recommendations  None recommended by PT    Recommendations for Other Services       Precautions / Restrictions Precautions Precautions: Fall Restrictions Weight Bearing Restrictions: No      Mobility  Bed Mobility               General bed mobility comments: Pt sitting in recliner upon PT arrival.   Transfers Overall transfer level: Needs assistance Equipment used: None Transfers: Sit to/from Stand Sit to Stand: Min guard          General transfer comment: VC's for hand placement on seated surface for safety.   Ambulation/Gait Ambulation/Gait assistance: Min guard Ambulation Distance (Feet): 200 Feet Assistive device: Rolling walker (2 wheeled) Gait Pattern/deviations: Step-through pattern;Decreased stride length;Trunk flexed Gait velocity: Decreased Gait velocity interpretation: Below normal speed for age/gender General Gait Details: Pt reaching for window sill for support upon stand without AD, so RW was provided for ambulation. Pt shuffling feet along and not completely clearing floor. Was able to make corrective changes for a short time.   Stairs            Wheelchair Mobility    Modified Rankin (Stroke Patients Only)       Balance Overall balance assessment: Needs assistance Sitting-balance support: Feet supported;No upper extremity supported Sitting balance-Leahy Scale: Good     Standing balance support: No upper extremity supported Standing balance-Leahy Scale: Fair Standing balance comment: Pt reaching out for support however able to maintain standing balance.                              Pertinent Vitals/Pain Pain Assessment: No/denies pain    Home Living Family/patient expects to be discharged to:: Other (Comment) (Independent living Environmental education officer)) Living Arrangements: Spouse/significant other               Additional Comments: Husband is 89 and have caregivers 3 hours in the morning and 6 hours in the afternoon.     Prior Function Level of Independence: Needs assistance   Gait /  Transfers Assistance Needed: Use the RW more than the cane   ADL's / Homemaking Assistance Needed: Caregivers assist with shoes and socks. They also do cooking and cleaning.         Hand Dominance   Dominant Hand: Right    Extremity/Trunk Assessment   Upper Extremity Assessment: Defer to OT evaluation           Lower Extremity Assessment: Overall WFL for tasks  assessed (Strength grossly 4+/5, however pt with decreased tolerance for functional activity as well as floor clearance/DF)      Cervical / Trunk Assessment: Kyphotic  Communication   Communication: No difficulties  Cognition Arousal/Alertness: Awake/alert Behavior During Therapy: WFL for tasks assessed/performed Overall Cognitive Status: Within Functional Limits for tasks assessed                      General Comments      Exercises General Exercises - Lower Extremity Ankle Circles/Pumps: 10 reps Hip ABduction/ADduction: 10 reps Hip Flexion/Marching: Seated;10 reps      Assessment/Plan    PT Assessment Patient needs continued PT services  PT Diagnosis Difficulty walking;Generalized weakness   PT Problem List Decreased strength;Decreased range of motion;Decreased activity tolerance;Decreased balance;Decreased mobility;Decreased knowledge of use of DME;Decreased safety awareness;Decreased knowledge of precautions  PT Treatment Interventions DME instruction;Gait training;Stair training;Functional mobility training;Therapeutic activities;Therapeutic exercise;Neuromuscular re-education;Patient/family education   PT Goals (Current goals can be found in the Care Plan section) Acute Rehab PT Goals Patient Stated Goal: To return home with husband PT Goal Formulation: With patient/family Time For Goal Achievement: 10/15/14 Potential to Achieve Goals: Good    Frequency Min 3X/week   Barriers to discharge        Co-evaluation               End of Session Equipment Utilized During Treatment: Gait belt Activity Tolerance: Patient tolerated treatment well Patient left: in chair;with call bell/phone within reach;with family/visitor present Nurse Communication: Mobility status         Time: 0093-8182 PT Time Calculation (min): 32 min   Charges:   PT Evaluation $Initial PT Evaluation Tier I: 1 Procedure PT Treatments $Gait Training: 8-22 mins $Therapeutic  Activity: 8-22 mins   PT G Codes:          Rolinda Roan 10/08/2014, 2:50 PM  Rolinda Roan, PT, DPT Acute Rehabilitation Services Pager: [phone]

## 2014-10-11 ENCOUNTER — Ambulatory Visit: Payer: Medicare Other | Admitting: Cardiology

## 2014-10-12 ENCOUNTER — Encounter: Payer: Self-pay | Admitting: Nurse Practitioner

## 2014-10-13 NOTE — Progress Notes (Signed)
Review of record indicates that patient d/c'd back to [facility] today with her husband. No change in level of care was indicated for patient. Home Health PT recommended by Physical Therapy. RNCM to facilitate this.   CSW signing off.  Lorie Phenix. Pauline Good, [place]

## 2014-10-14 ENCOUNTER — Encounter: Payer: Self-pay | Admitting: Cardiology

## 2014-10-14 ENCOUNTER — Ambulatory Visit: Payer: Medicare Other | Admitting: Cardiology

## 2014-10-14 ENCOUNTER — Ambulatory Visit (INDEPENDENT_AMBULATORY_CARE_PROVIDER_SITE_OTHER): Payer: Medicare Other | Admitting: Cardiology

## 2014-10-14 VITALS — BP 184/83 | HR 59 | Ht 66.0 in | Wt 143.9 lb

## 2014-10-14 DIAGNOSIS — R0602 Shortness of breath: Secondary | ICD-10-CM

## 2014-10-14 DIAGNOSIS — I2510 Atherosclerotic heart disease of native coronary artery without angina pectoris: Secondary | ICD-10-CM

## 2014-10-14 DIAGNOSIS — Z79899 Other long term (current) drug therapy: Secondary | ICD-10-CM

## 2014-10-14 LAB — BASIC METABOLIC PANEL
BUN: 15 mg/dL (ref 6–23)
CHLORIDE: 102 meq/L (ref 96–112)
CO2: 26 mEq/L (ref 19–32)
Calcium: 9.1 mg/dL (ref 8.4–10.5)
Creat: 0.95 mg/dL (ref 0.50–1.10)
GLUCOSE: 99 mg/dL (ref 70–99)
POTASSIUM: 4.7 meq/L (ref 3.5–5.3)
SODIUM: 136 meq/L (ref 135–145)

## 2014-10-14 MED ORDER — LISINOPRIL 10 MG PO TABS: 10.0000 mg | ORAL_TABLET | Freq: Two times a day (BID) | ORAL | Status: AC

## 2014-10-14 MED ORDER — FUROSEMIDE 20 MG PO TABS: 20.0000 mg | ORAL_TABLET | Freq: Every day | ORAL | Status: DC

## 2014-10-14 NOTE — Progress Notes (Signed)
HPI The patient has a history of supraventricular tachycardia. She presents for follow up after recent hospitalization. She presented with some acute shortness of breath. I reviewed his notes extensively. She did have an elevated BNP. She was seen in consultation by Dr. 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 However, since her bradycardia was only at night and she does not really want a pacemaker the decision was to observe only. She was discharged on some diuretic. She's not had any of the acute shortness of breath that she was having but her daughter say she's more dyspneic walking. She's not describing PND or orthopnea. I had previously seen her with her supraventricular tachycardia and she had worn an event monitor. She had runs of SVT but no symptomatic bradycardia arrhythmias. There was no associated syncope. The patient currently denies palpitations, presyncope or syncope. She's not having any chest discomfort, neck or arm discomfort. She is frail however.  No Known Allergies  Current Outpatient Prescriptions  Medication Sig Dispense Refill  . aspirin EC 81 MG tablet Take 81 mg by mouth every evening.      Marland Kitchen atorvastatin (LIPITOR) 20 MG tablet Take 20 mg by mouth daily.      . brimonidine (ALPHAGAN) 0.2 % ophthalmic solution Place 1 drop into both eyes 2 (two) times daily.      . celecoxib (CELEBREX) 200 MG capsule TAKE ONE CAPSULE BY MOUTH EVERY DAY  90 capsule  1  . diphenhydrAMINE (SOMINEX) 25 MG tablet Take 25 mg by mouth at bedtime.      Marland Kitchen ezetimibe (ZETIA) 10 MG tablet Take 10 mg by mouth daily.      Marland Kitchen HYDROcodone-acetaminophen (NORCO/VICODIN) 5-325 MG per tablet Take one tablet by mouth every 4 hours as needed for pain  180 tablet  0  . imipramine (TOFRANIL) 25 MG tablet Take 50 mg by mouth at bedtime. To reduce urinary frequency      . latanoprost (XALATAN) 0.005 % ophthalmic solution Place 1 drop into both eyes every evening. UAD      . levothyroxine (SYNTHROID, LEVOTHROID)  50 MCG tablet Take 50 mcg by mouth daily before breakfast.      . Multiple Vitamin (MULTIVITAMIN WITH MINERALS) TABS Take 1 tablet by mouth daily. Certavite-A      . omeprazole (PRILOSEC) 40 MG capsule Take 40 mg by mouth daily.      . pindolol (VISKEN) 5 MG tablet TAKE ONE-HALF TABLET BY MOUTH DAILY  45 tablet  0   No current facility-administered medications for this visit.    Past Medical History  Diagnosis Date  . SVT (supraventricular tachycardia)     Diagnosed 2010  . HTN (hypertension)   . Dyslipidemia   . CAD (coronary artery disease)     a. NSTEMI 2010 in setting of SVT, with cath with unsuccessful PCI - chronic total occlusion of LAD, R->L collaterals.  . GI bleeding   . Unspecified hypothyroidism   . Unspecified hereditary and idiopathic peripheral neuropathy   . Pneumonia, organism unspecified   . Basal cell carcinoma of skin of trunk, except scrotum   . Unspecified malignant neoplasm of skin of lower limb, including hip   . Basal cell carcinoma of skin of lower limb, including hip   . Unspecified glaucoma     left eye  . Other premature beats   . Other esophagitis   . Esophageal reflux   . Osteoarthrosis, unspecified whether generalized or localized, unspecified site     multiple joints  .  Pain in joint, shoulder region   . Pain in joint, lower leg     knee  . Pain in joint, ankle and foot     right  . Sacroiliitis, not elsewhere classified   . Cervicalgia   . Lumbago   . Ganglion of tendon sheath   . Pathologic fracture of vertebrae   . Tietze's disease   . Insomnia, unspecified   . Disturbance of skin sensation   . Edema   . Palpitations   . Other nonspecific abnormal serum enzyme levels   . Nasal bones, closed fracture   . Closed fracture of unspecified trochanteric section of femur   . Open wound of knee, leg (except thigh), and ankle, without mention of complication   . Hip, thigh, leg, and ankle, abrasion or friction burn, without mention of  infection   . Contusion of hip   . Fall from other slipping, tripping, or stumbling   . Personal history of fall   . Dyspepsia and other specified disorders of function of stomach   . Paroxysmal supraventricular tachycardia   . Acute bronchiolitis due to other infectious organisms 03/02/2013  . Other abnormal blood chemistry 03/15/2013  . Contusion of rib on left side 08/03/2013    ROS:  As stated in the HPI and negative for all other systems.  PHYSICAL EXAM BP 184/83  Pulse 59  Ht 5\' 6"  (1.676 m)  Wt 143 lb 14.4 oz (65.273 kg)  BMI 23.24 kg/m2 GEN:  No distress, frail appearing  NECK:  No jugular venous distention at 90 degrees, waveform within normal limits, carotid upstroke brisk and symmetric, no bruits, no thyromegaly LYMPHATICS:  No cervical adenopathy LUNGS:  Clear to auscultation bilaterally BACK:  No CVA tenderness CHEST:  Unremarkable HEART:  S1 and S2 within normal limits, no S3, no S4, no clicks, no rubs, soft apical systolic murmur, no diastolic murmurs ABD:  Positive bowel sounds normal in frequency in pitch, no bruits, no rebound, no guarding, unable to assess midline mass or bruit with the patient seated. EXT:  2 plus pulses throughout, moderate edema, no cyanosis no clubbing SKIN:  No rashes no nodules, bruising and wounds.   NEURO:  Cranial nerves II through XII grossly intact, motor grossly intact throughout PSYCH:  Cognitively intact, oriented to person place and time  EKG:  Sinus bradycardia, rate 56, left bundle branch block.  10/14/2014   ASSESSMENT AND PLAN  PSVT -  The patient does have tachybradycardia syndrome with very much wants to avoid a pacemaker. I think she can continue the low-dose atenolol. I will cancel her appointment with Dr. Lovena Le.  HYPERTENSION -  Her blood pressure is more elevated than previous. I don't know if this contributes somewhat to the dyspnea. I am going to increase the lisinopril.  I will get a basic metabolic profile today  and again in 2 weeks. I did review her blood pressure diary. I  DYSPNEA - This is multifactorial. Has apparently been some diastolic dysfunction. She's going to continue the low-dose Lasix..  We did ambulate her around the office and her oxygen level did not dip below 89%. We've prescribed home O2 at this point.  CHRONIC DIASTOLIC HF - As above.  CAD - I reviewed extensively her hospital records. She has had no active ischemia. No change in therapy is indicated other than above.

## 2014-10-14 NOTE — Addendum Note (Signed)
Addended by: Dolan Amen on: 10/14/2014 09:48 AM   Modules accepted: Orders

## 2014-10-14 NOTE — Patient Instructions (Signed)
  We will see you back in follow up in 1 month with an extender.   Dr Percival Spanish has ordered: Your physician recommends that you return for lab work in: today  Your physician recommends that you return for lab work in: 2 weeks  Increase lisinopril to 10mg  twice a day.

## 2014-10-17 ENCOUNTER — Telehealth: Payer: Self-pay | Admitting: Cardiology

## 2014-10-17 NOTE — Telephone Encounter (Signed)
Can she do her lab work on 10-27-14 instead of 10-28-14?

## 2014-10-17 NOTE — Telephone Encounter (Signed)
LM for Mliss Sax that patient was instructed to have labs 2 weeks after OV on 10/16 - 10/29 or 10/30 is fine

## 2014-10-18 ENCOUNTER — Encounter: Payer: Self-pay | Admitting: Internal Medicine

## 2014-10-18 NOTE — Telephone Encounter (Signed)
Daughter, [name] called and stated that she needs a letter to go with the [employer] Forms you filled out yesterday stating that you recommend that patient do not drive and list the reasons you stated on forms. Needs for both mom and dad. Would like to pick up today. Please Advise.

## 2014-10-19 NOTE — Telephone Encounter (Signed)
Letter Printed and left up front for patient caregiver to pick up

## 2014-10-20 ENCOUNTER — Ambulatory Visit: Payer: Medicare Other | Admitting: Cardiology

## 2014-10-26 ENCOUNTER — Non-Acute Institutional Stay: Payer: Medicare Other | Admitting: Nurse Practitioner

## 2014-10-26 ENCOUNTER — Encounter: Payer: Self-pay | Admitting: Nurse Practitioner

## 2014-10-26 VITALS — BP 110/62 | HR 60 | Temp 97.4°F | Wt 141.0 lb

## 2014-10-26 DIAGNOSIS — I5032 Chronic diastolic (congestive) heart failure: Secondary | ICD-10-CM

## 2014-10-26 DIAGNOSIS — S91001A Unspecified open wound, right ankle, initial encounter: Secondary | ICD-10-CM

## 2014-10-26 DIAGNOSIS — I10 Essential (primary) hypertension: Secondary | ICD-10-CM

## 2014-10-26 DIAGNOSIS — K219 Gastro-esophageal reflux disease without esophagitis: Secondary | ICD-10-CM

## 2014-10-26 DIAGNOSIS — IMO0001 Reserved for inherently not codable concepts without codable children: Secondary | ICD-10-CM

## 2014-10-26 DIAGNOSIS — S81801A Unspecified open wound, right lower leg, initial encounter: Secondary | ICD-10-CM

## 2014-10-26 DIAGNOSIS — S81001A Unspecified open wound, right knee, initial encounter: Secondary | ICD-10-CM

## 2014-10-26 DIAGNOSIS — I2510 Atherosclerotic heart disease of native coronary artery without angina pectoris: Secondary | ICD-10-CM

## 2014-10-26 MED ORDER — MUPIROCIN CALCIUM 2 % EX CREA: 1.0000 "application " | TOPICAL_CREAM | Freq: Every day | CUTANEOUS | Status: DC

## 2014-10-26 NOTE — Progress Notes (Signed)
[name], female   DOB: 10/05/1920, 80 y.o.   MRN: [id number]    Nursing Home Location:  [place] of Service: Clinic (12)  PCP: Estill Dooms, MD  No Known Allergies  Chief Complaint  Patient presents with  . Sore    right lower leg.  Patient picked at wound, getting dressing changed by [facility] clinic nurse.  Wanted it checked, looked red, infected.    HPI:  Patient is a 80 y.o. female seen today at [facility]. [place] is here for routine follow up. history of dementia, CAD, HTN, mild AS, chronic diastolic CHF and tachybrady syndrome with SVT. Pt was hospitalized at the beginning of October for dyspnea and was discharged to [facility] for ongoing strength and gait training and medication management. Continues Following with cardiology. Has follow up bmp in the am here at [facility]. Pt has also been following with clinic nurse due to lump on right that burst open with serosanguinous drainage. Mepilex applied and changing dressing every other day. Would like this evaluated today and to see if there is any more recommendations for treatment   Review of Systems:  Review of Systems  Constitutional: Negative for fever, chills, weight loss, malaise/fatigue and diaphoresis.  HENT: Positive for hearing loss. Negative for congestion and sore throat.   Eyes: Negative for blurred vision and double vision.  Respiratory: Negative for cough, sputum production and shortness of breath.   Cardiovascular: Negative for chest pain, palpitations, leg swelling and PND.       PSVT and syncopal episodes.  Gastrointestinal: Negative for heartburn, nausea, diarrhea and constipation.  Genitourinary: Positive for frequency. Negative for dysuria, urgency, hematuria and flank pain.  Musculoskeletal: Positive for gait problem (multiple falls.). Negative for back pain, myalgias and neck pain.  Skin: Positive for  wound. Negative for itching and rash.       Healing laceration/wound of the right knee. Open area on right skin  Neurological: Negative for tingling, tremors, sensory change, speech change, focal weakness, seizures, weakness and headaches.  Hematological: Bruises/bleeds easily.  Psychiatric/Behavioral: Positive for memory loss and decreased concentration. Negative for depression, suicidal ideas, hallucinations, behavioral problems, self-injury and dysphoric mood. The patient does not have insomnia.     Past Medical History  Diagnosis Date  . SVT (supraventricular tachycardia)     Diagnosed 2010  . HTN (hypertension)   . Dyslipidemia   . CAD (coronary artery disease)     a. NSTEMI 2010 in setting of SVT, with cath with unsuccessful PCI - chronic total occlusion of LAD, R->L collaterals.  . GI bleeding   . Unspecified hypothyroidism   . Unspecified hereditary and idiopathic peripheral neuropathy   . Pneumonia, organism unspecified   . Basal cell carcinoma of skin of trunk, except scrotum   . Unspecified malignant neoplasm of skin of lower limb, including hip   . Basal cell carcinoma of skin of lower limb, including hip   . Unspecified glaucoma     left eye  . Other premature beats   . Other esophagitis   . Esophageal reflux   . Osteoarthrosis, unspecified whether generalized or localized, unspecified site     multiple joints  . Pain in joint, shoulder region   . Pain in joint, lower leg     knee  . Pain in joint, ankle and foot     right  . Sacroiliitis, not elsewhere classified   . Cervicalgia   .  Lumbago   . Ganglion of tendon sheath   . Pathologic fracture of vertebrae   . Tietze's disease   . Insomnia, unspecified   . Disturbance of skin sensation   . Edema   . Palpitations   . Other nonspecific abnormal serum enzyme levels   . Nasal bones, closed fracture   . Closed fracture of unspecified trochanteric section of femur   . Open wound of knee, leg (except thigh), and  ankle, without mention of complication   . Hip, thigh, leg, and ankle, abrasion or friction burn, without mention of infection   . Contusion of hip   . Fall from other slipping, tripping, or stumbling   . Personal history of fall   . Dyspepsia and other specified disorders of function of stomach   . Paroxysmal supraventricular tachycardia   . Acute bronchiolitis due to other infectious organisms 03/02/2013  . Other abnormal blood chemistry 03/15/2013  . Contusion of rib on left side 08/03/2013   Past Surgical History  Procedure Laterality Date  . Total hip arthroplasty Left 2001  . Breast lumpectomy Right 1995     negative for malignancy  . Cataract extraction w/ intraocular lens  implant, bilateral Bilateral 2003  . Leg skin lesion  biopsy / excision Left 08/22/2010    Lower leg shave biopsy -solar keratosis (Dr. Radford Pax)  . Joint replacement     Social History:   reports that she has never smoked. She has never used smokeless tobacco. She reports that she drinks alcohol. She reports that she does not use illicit drugs.  Family History  Problem Relation Age of Onset  . Stroke Neg Hx   . Heart attack Neg Hx   . Heart disease Father   . Cancer Brother     lung    Medications: Patient's Medications  New Prescriptions   No medications on file  Previous Medications   ASPIRIN EC 81 MG TABLET    Take 81 mg by mouth every evening.   ATORVASTATIN (LIPITOR) 20 MG TABLET    Take 20 mg by mouth daily.   BRIMONIDINE (ALPHAGAN) 0.2 % OPHTHALMIC SOLUTION    Place 1 drop into both eyes 2 (two) times daily.   CELECOXIB (CELEBREX) 200 MG CAPSULE    TAKE ONE CAPSULE BY MOUTH EVERY DAY   DIPHENHYDRAMINE (SOMINEX) 25 MG TABLET    Take 25 mg by mouth at bedtime.   EZETIMIBE (ZETIA) 10 MG TABLET    Take 10 mg by mouth daily.   FUROSEMIDE (LASIX) 20 MG TABLET    Take 1 tablet (20 mg total) by mouth daily.   HYDROCODONE-ACETAMINOPHEN (NORCO/VICODIN) 5-325 MG PER TABLET    Take one tablet by mouth  every 4 hours as needed for pain   IMIPRAMINE (TOFRANIL) 25 MG TABLET    Take 50 mg by mouth at bedtime. To reduce urinary frequency   LATANOPROST (XALATAN) 0.005 % OPHTHALMIC SOLUTION    Place 1 drop into both eyes every evening. UAD   LEVOTHYROXINE (SYNTHROID, LEVOTHROID) 50 MCG TABLET    Take 50 mcg by mouth daily before breakfast.   LISINOPRIL (PRINIVIL,ZESTRIL) 10 MG TABLET    Take 1 tablet (10 mg total) by mouth 2 (two) times daily.   MULTIPLE VITAMIN (MULTIVITAMIN WITH MINERALS) TABS    Take 1 tablet by mouth daily. Certavite-A   OMEPRAZOLE (PRILOSEC) 40 MG CAPSULE    Take 40 mg by mouth daily.   PINDOLOL (VISKEN) 5 MG TABLET    TAKE ONE-HALF  TABLET BY MOUTH DAILY  Modified Medications   No medications on file  Discontinued Medications   No medications on file     Physical Exam: Filed Vitals:   10/26/14 1538  BP: 110/62  Pulse: 60  Temp: 97.4 F (36.3 C)  TempSrc: Oral  Weight: 141 lb (63.957 kg)    Physical Exam  Constitutional: She appears well-developed and well-nourished.  HENT:  Mouth/Throat: Oropharynx is clear and moist. No oropharyngeal exudate.  Bilateral hearing loss  Eyes: Conjunctivae are normal. Pupils are equal, round, and reactive to light.  Neck: Normal range of motion. Neck supple. No JVD present.  Cardiovascular: Normal rate, regular rhythm and normal heart sounds.   Pulmonary/Chest: Effort normal and breath sounds normal.  Abdominal: Soft. Bowel sounds are normal. There is no tenderness.  Musculoskeletal: Normal range of motion. She exhibits no edema.  Large ganglion of the proximal phalanx of the right fifth digit. Some restriction in full flexion. Unstable gait.  Neurological: She is alert.  Memory loss.  Skin: Skin is warm and dry.  Discolored skin in legs and arms from multiple injuries, sun damage, and skin cancers.  2 X 1.5 cm wound to right shin. Area with serous drainage. Red base.   Psychiatric: She has a normal mood and affect.     Labs reviewed: Basic Metabolic Panel:  Recent Labs  10/07/14 0430 10/08/14 0325 10/14/14 1138  NA 142 137 136  K 3.7 4.8 4.7  CL 103 102 102  CO2 25 24 26   GLUCOSE 80 87 99  BUN 19 20 15   CREATININE 1.02 1.08 0.95  CALCIUM 8.7 8.6 9.1   Liver Function Tests:  Recent Labs  02/19/14 1232 10/06/14 1635  AST 27 34  ALT 20 29  ALKPHOS 116 137*  BILITOT 0.3 0.6  PROT 7.3 7.7  ALBUMIN 3.1* 3.2*   No results found for this basename: LIPASE, AMYLASE,  in the last 8760 hours No results found for this basename: AMMONIA,  in the last 8760 hours CBC:  Recent Labs  09/02/14 1136 09/08/14 1230 10/06/14 1635  WBC 6.4 10.4 7.8  NEUTROABS 4.3 7.6 5.0  HGB 14.6 14.0 13.2  HCT 43.0 42.4 40.2  MCV 97.7 98.6 98.0  PLT 173 182 232   TSH:  Recent Labs  12/21/13 05/24/14 1003  TSH 6.10* 4.310   A1C: Lab Results  Component Value Date   HGBA1C 5.7 12/21/2013   Lipid Panel:  Recent Labs  03/24/14  CHOL 129  HDL 29*  LDLCALC 69  TRIG 117    Assessment/Plan 1. Open wound of knee, leg (except thigh), and ankle, complicated, right, initial encounter -cont dressing changes, will change to daily and have nursing clean site, apply Bactroban annd apply dressing - mupirocin cream (BACTROBAN) 2 %; Apply 1 application topically daily. Clean wound then apply ointment once daily, cover with dressing  Dispense: 15 g; Refill: 0  2. Essential hypertension controlled  3. Reflux Stable on medications  4. Chronic diastolic heart failure -conts with cardiology, having follow up BMP done at [facility] tomorrow -conts on lasix daily  Follow up wound in 2 weeks or sooner if needed

## 2014-10-27 ENCOUNTER — Other Ambulatory Visit: Payer: Self-pay | Admitting: *Deleted

## 2014-10-27 ENCOUNTER — Ambulatory Visit: Payer: Medicare Other | Admitting: Internal Medicine

## 2014-10-27 DIAGNOSIS — S81001A Unspecified open wound, right knee, initial encounter: Secondary | ICD-10-CM

## 2014-10-27 DIAGNOSIS — S81801A Unspecified open wound, right lower leg, initial encounter: Principal | ICD-10-CM

## 2014-10-27 DIAGNOSIS — S91001A Unspecified open wound, right ankle, initial encounter: Principal | ICD-10-CM

## 2014-10-27 LAB — BASIC METABOLIC PANEL
BUN: 21 mg/dL (ref 4–21)
Creatinine: 1 mg/dL (ref 0.5–1.1)
GLUCOSE: 85 mg/dL
POTASSIUM: 4 mmol/L (ref 3.4–5.3)
SODIUM: 138 mmol/L (ref 137–147)

## 2014-10-27 MED ORDER — MUPIROCIN CALCIUM 2 % EX CREA: 1.0000 "application " | TOPICAL_CREAM | Freq: Every day | CUTANEOUS | Status: DC

## 2014-10-27 NOTE — Telephone Encounter (Signed)
Patient lost Rx Faxed one to pharmacy per daughter

## 2014-11-01 ENCOUNTER — Other Ambulatory Visit: Payer: Self-pay | Admitting: Internal Medicine

## 2014-11-01 ENCOUNTER — Encounter: Payer: Self-pay | Admitting: Cardiology

## 2014-11-01 ENCOUNTER — Telehealth: Payer: Self-pay | Admitting: Cardiology

## 2014-11-01 ENCOUNTER — Ambulatory Visit (INDEPENDENT_AMBULATORY_CARE_PROVIDER_SITE_OTHER): Payer: Medicare Other | Admitting: Cardiology

## 2014-11-01 VITALS — BP 130/70 | HR 52 | Ht 66.0 in

## 2014-11-01 DIAGNOSIS — R0602 Shortness of breath: Secondary | ICD-10-CM | POA: Insufficient documentation

## 2014-11-01 DIAGNOSIS — I495 Sick sinus syndrome: Secondary | ICD-10-CM

## 2014-11-01 DIAGNOSIS — I2510 Atherosclerotic heart disease of native coronary artery without angina pectoris: Secondary | ICD-10-CM

## 2014-11-01 NOTE — Assessment & Plan Note (Signed)
Completely resolved now that her HR is controlled.  Most likely due to SVT.

## 2014-11-01 NOTE — Progress Notes (Signed)
11/01/2014   PCP: Estill Dooms, MD   Chief Complaint  Patient presents with  . Acute Visit    sob and dizziness    Primary Cardiologist:Dr. Vita Barley   HPI:  80 year old female presents after episode of acute SOB today with varying HR.  She has hx of SVT and the episode was related to HRs up to 100 or greater.  Once the irregular HR improved she is now without complaints.  She had just been hospitalized with similar episode.  Problem is her bradycardia.  Difficult to increase because this results in brady and  Syncope.  She has seen Dr. Lovena Le but pt really does not wish to have PPM.    Her last visit with Dr. Percival Spanish was 10/14/14 and her tachy-brady syndrome was discussed.  We are attempting to balance the meds to prevent severe brady but to prevent SVT.  We discussed that she may have short episodes like today, and I agree if she feels better -then best not to go to ER.  But if symptoms continue then she would need to go to ER. Her pulse today is 52. She had no chest pain.  No Known Allergies  Current Outpatient Prescriptions  Medication Sig Dispense Refill  . aspirin EC 81 MG tablet Take 81 mg by mouth every evening.    Marland Kitchen atorvastatin (LIPITOR) 20 MG tablet Take 20 mg by mouth daily.    . brimonidine (ALPHAGAN) 0.2 % ophthalmic solution Place 1 drop into both eyes 2 (two) times daily.    . Brinzolamide-Brimonidine (SIMBRINZA) 1-0.2 % SUSP Apply to eye. One drop in both eyes twice daily    . celecoxib (CELEBREX) 200 MG capsule TAKE ONE CAPSULE BY MOUTH EVERY DAY 90 capsule 1  . diphenhydrAMINE (SOMINEX) 25 MG tablet Take 25 mg by mouth at bedtime.    Marland Kitchen ezetimibe (ZETIA) 10 MG tablet Take 10 mg by mouth daily.    . furosemide (LASIX) 20 MG tablet Take 1 tablet (20 mg total) by mouth daily. 90 tablet 3  . imipramine (TOFRANIL) 25 MG tablet Take 50 mg by mouth at bedtime. To reduce urinary frequency    . latanoprost (XALATAN) 0.005 % ophthalmic solution Place 1  drop into both eyes every evening. UAD    . levothyroxine (SYNTHROID, LEVOTHROID) 50 MCG tablet Labs overdue 1 By mouth daily 90 tablet 0  . lisinopril (PRINIVIL,ZESTRIL) 10 MG tablet Take 1 tablet (10 mg total) by mouth 2 (two) times daily. 180 tablet 3  . Multiple Vitamin (MULTIVITAMIN WITH MINERALS) TABS Take 1 tablet by mouth daily. Certavite-A    . mupirocin cream (BACTROBAN) 2 % Apply 1 application topically daily. Clean wound then apply ointment once daily, cover with dressing 15 g 0  . naproxen sodium (ANAPROX) 220 MG tablet Take 220 mg by mouth. Take one tablet daily as needed for pain    . omeprazole (PRILOSEC) 40 MG capsule Take 40 mg by mouth daily.    . pindolol (VISKEN) 5 MG tablet TAKE ONE-HALF TABLET BY MOUTH DAILY 45 tablet 0   No current facility-administered medications for this visit.    Past Medical History  Diagnosis Date  . SVT (supraventricular tachycardia)     Diagnosed 2010  . HTN (hypertension)   . Dyslipidemia   . CAD (coronary artery disease)     a. NSTEMI 2010 in setting of SVT, with cath with unsuccessful PCI - chronic total occlusion of  LAD, R->L collaterals.  . GI bleeding   . Unspecified hypothyroidism   . Unspecified hereditary and idiopathic peripheral neuropathy   . Pneumonia, organism unspecified   . Basal cell carcinoma of skin of trunk, except scrotum   . Unspecified malignant neoplasm of skin of lower limb, including hip   . Basal cell carcinoma of skin of lower limb, including hip   . Unspecified glaucoma     left eye  . Other premature beats   . Other esophagitis   . Esophageal reflux   . Osteoarthrosis, unspecified whether generalized or localized, unspecified site     multiple joints  . Pain in joint, shoulder region   . Pain in joint, lower leg     knee  . Pain in joint, ankle and foot     right  . Sacroiliitis, not elsewhere classified   . Cervicalgia   . Lumbago   . Ganglion of tendon sheath   . Pathologic fracture of  vertebrae   . Tietze's disease   . Insomnia, unspecified   . Disturbance of skin sensation   . Edema   . Palpitations   . Other nonspecific abnormal serum enzyme levels   . Nasal bones, closed fracture   . Closed fracture of unspecified trochanteric section of femur   . Open wound of knee, leg (except thigh), and ankle, without mention of complication   . Hip, thigh, leg, and ankle, abrasion or friction burn, without mention of infection   . Contusion of hip   . Fall from other slipping, tripping, or stumbling   . Personal history of fall   . Dyspepsia and other specified disorders of function of stomach   . Paroxysmal supraventricular tachycardia   . Acute bronchiolitis due to other infectious organisms 03/02/2013  . Other abnormal blood chemistry 03/15/2013  . Contusion of rib on left side 08/03/2013    Past Surgical History  Procedure Laterality Date  . Total hip arthroplasty Left 2001  . Breast lumpectomy Right 1995     negative for malignancy  . Cataract extraction w/ intraocular lens  implant, bilateral Bilateral 2003  . Leg skin lesion  biopsy / excision Left 08/22/2010    Lower leg shave biopsy -solar keratosis (Dr. Radford Pax)  . Joint replacement      IRW:[id number]:QQ colds or fevers, no weight changes Skin:no rashes or ulcers HEENT:no blurred vision, no congestion CV:see HPI PUL:see HPI GI:no diarrhea constipation or melena, no indigestion GU:no hematuria, no dysuria MS:no joint pain, no claudication Neuro:no syncope, no lightheadedness Endo:no diabetes, no thyroid disease  Wt Readings from Last 3 Encounters:  10/26/14 141 lb (63.957 kg)  10/14/14 143 lb 14.4 oz (65.273 kg)  10/08/14 141 lb 11.2 oz (64.275 kg)    PHYSICAL EXAM BP 130/70 mmHg  Pulse 52  Ht 5\' 6"  (1.676 m)  Wt   SpO2 99% General:Pleasant affect, NAD Skin:Warm and dry, brisk capillary refill HEENT:normocephalic, sclera clear, mucus membranes moist Neck:supple, no JVD, no bruits  Heart:S1S2  RRR without murmur, gallup, rub or click Lungs:clear without rales, rhonchi, or wheezes PYP:PJKD, non tender, + BS, do not palpate liver spleen or masses Ext:no lower ext edema, 2+ pedal pulses, 2+ radial pulses Neuro:alert and oriented, MAE, follows commands, + facial symmetry TOI:ZTIW  ASSESSMENT AND PLAN Tachycardia-bradycardia syndrome Most likely another episode of SVT with HR up to 100 or greater, on exam HR 52.  Will not adjust meds, she was reassured that the tachycardia may come in flurries or may  not come back for awhile.  I will send note to Dr. Vita Barley. I prefer not to increase BB at this time.      SOB (shortness of breath) Completely resolved now that her HR is controlled.  Most likely due to SVT.

## 2014-11-01 NOTE — Telephone Encounter (Signed)
Addressed in previous telephone encounter. Patient to be seen today by Mickel Baas, NP @ 3:30

## 2014-11-01 NOTE — Patient Instructions (Signed)
Your physician recommends that you continue on your current medications as directed. Please refer to the Current Medication list given to you today.    Your physician recommends that you schedule a follow-up appointment in:  DR [facility] IN 4 WEEKS

## 2014-11-01 NOTE — Assessment & Plan Note (Addendum)
Most likely another episode of SVT with HR up to 100 or greater, on exam HR 52.  Will not adjust meds, she was reassured that the tachycardia may come in flurries or may not come back for awhile.  I will send note to Dr. Vita Barley. I prefer not to increase BB at this time.

## 2014-11-01 NOTE — Telephone Encounter (Signed)
New Message        RN from residential facility calling stating that pt is c/o sob. Please advise.

## 2014-11-01 NOTE — Telephone Encounter (Signed)
Call was transferred from [facility] in scheduling department at [facility] office.  Spoke with Mliss Sax Environmental education officer nurse). Patient was going to vote and had acute episode of shortness of breath when walking (is OK at rest). She feels dizzy & weak. This episode has been going on about 30 minutes. BP 158/82 RR 22. Bernadette reports HR is "irratic" ranging from 54-56 to 90-102.   Patient is scheduled to see Mickel Baas, NP today at 330pm at [facility]. Communicated this to [place] who will call patient's caregiver. Callback number given should any further questions/concerns arise

## 2014-11-03 ENCOUNTER — Encounter: Payer: Self-pay | Admitting: Cardiology

## 2014-11-09 ENCOUNTER — Encounter: Payer: Self-pay | Admitting: Nurse Practitioner

## 2014-11-09 ENCOUNTER — Non-Acute Institutional Stay: Payer: Medicare Other | Admitting: Nurse Practitioner

## 2014-11-09 VITALS — BP 120/72 | HR 60 | Temp 95.7°F | Wt 141.0 lb

## 2014-11-09 DIAGNOSIS — S81001D Unspecified open wound, right knee, subsequent encounter: Secondary | ICD-10-CM

## 2014-11-09 DIAGNOSIS — S51011A Laceration without foreign body of right elbow, initial encounter: Secondary | ICD-10-CM

## 2014-11-09 DIAGNOSIS — S81801D Unspecified open wound, right lower leg, subsequent encounter: Secondary | ICD-10-CM

## 2014-11-09 DIAGNOSIS — I2510 Atherosclerotic heart disease of native coronary artery without angina pectoris: Secondary | ICD-10-CM

## 2014-11-09 DIAGNOSIS — S91001D Unspecified open wound, right ankle, subsequent encounter: Secondary | ICD-10-CM

## 2014-11-09 NOTE — Progress Notes (Signed)
[name], female   DOB: 01-03-1920, 80 y.o.   MRN: [id number]    Nursing Home Location:  [place] of Service: Clinic (12)  PCP: Estill Dooms, MD  No Known Allergies  Chief Complaint  Patient presents with  . Medical Management of Chronic Issues     re-check wound on right lower leg.    HPI:  Patient is a 80 y.o. female seen today at [facility]. [place] is here for follow up wound on right leg. Area is much better.  Since last visit she tripped in her home and scraped right elbow; clinic nurse treating this with mepilex. No swelling or tenderness to elbow.  Review of Systems:  Review of Systems  Constitutional: Negative for fever, chills and diaphoresis.  HENT: Positive for hearing loss. Negative for congestion and sore throat.   Respiratory: Negative for cough and shortness of breath.   Cardiovascular: Negative for chest pain, palpitations and leg swelling.       PSVT and syncopal episodes.  Gastrointestinal: Negative for nausea, diarrhea and constipation.  Musculoskeletal: Positive for gait problem (multiple falls.). Negative for myalgias, back pain and neck pain.  Skin: Positive for wound. Negative for rash.       Partial skin tear to right elbow  Neurological: Negative for tremors, seizures, weakness and headaches.  Hematological: Bruises/bleeds easily.    Past Medical History  Diagnosis Date  . SVT (supraventricular tachycardia)     Diagnosed 2010  . HTN (hypertension)   . Dyslipidemia   . CAD (coronary artery disease)     a. NSTEMI 2010 in setting of SVT, with cath with unsuccessful PCI - chronic total occlusion of LAD, R->L collaterals.  . GI bleeding   . Unspecified hypothyroidism   . Unspecified hereditary and idiopathic peripheral neuropathy   . Pneumonia, organism unspecified   . Basal cell carcinoma of skin of trunk, except scrotum   . Unspecified malignant  neoplasm of skin of lower limb, including hip   . Basal cell carcinoma of skin of lower limb, including hip   . Unspecified glaucoma     left eye  . Other premature beats   . Other esophagitis   . Esophageal reflux   . Osteoarthrosis, unspecified whether generalized or localized, unspecified site     multiple joints  . Pain in joint, shoulder region   . Pain in joint, lower leg     knee  . Pain in joint, ankle and foot     right  . Sacroiliitis, not elsewhere classified   . Cervicalgia   . Lumbago   . Ganglion of tendon sheath   . Pathologic fracture of vertebrae   . Tietze's disease   . Insomnia, unspecified   . Disturbance of skin sensation   . Edema   . Palpitations   . Other nonspecific abnormal serum enzyme levels   . Nasal bones, closed fracture   . Closed fracture of unspecified trochanteric section of femur   . Open wound of knee, leg (except thigh), and ankle, without mention of complication   . Hip, thigh, leg, and ankle, abrasion or friction burn, without mention of infection   . Contusion of hip   . Fall from other slipping, tripping, or stumbling   . Personal history of fall   . Dyspepsia and other specified disorders of function of stomach   . Paroxysmal supraventricular tachycardia   . Acute bronchiolitis  due to other infectious organisms 03/02/2013  . Other abnormal blood chemistry 03/15/2013  . Contusion of rib on left side 08/03/2013   Past Surgical History  Procedure Laterality Date  . Total hip arthroplasty Left 2001  . Breast lumpectomy Right 1995     negative for malignancy  . Cataract extraction w/ intraocular lens  implant, bilateral Bilateral 2003  . Leg skin lesion  biopsy / excision Left 08/22/2010    Lower leg shave biopsy -solar keratosis (Dr. Radford Pax)  . Joint replacement     Social History:   reports that she has never smoked. She has never used smokeless tobacco. She reports that she drinks alcohol. She reports that she does not use  illicit drugs.  Family History  Problem Relation Age of Onset  . Stroke Neg Hx   . Heart attack Neg Hx   . Heart disease Father   . Cancer Brother     lung    Medications: Patient's Medications  New Prescriptions   No medications on file  Previous Medications   ASPIRIN EC 81 MG TABLET    Take 81 mg by mouth every evening.   ATORVASTATIN (LIPITOR) 20 MG TABLET    Take 20 mg by mouth daily.   BRIMONIDINE (ALPHAGAN) 0.2 % OPHTHALMIC SOLUTION    Place 1 drop into both eyes 2 (two) times daily.   BRINZOLAMIDE-BRIMONIDINE (SIMBRINZA) 1-0.2 % SUSP    Apply to eye. One drop in both eyes twice daily   CELECOXIB (CELEBREX) 200 MG CAPSULE    TAKE ONE CAPSULE BY MOUTH EVERY DAY   DIPHENHYDRAMINE (SOMINEX) 25 MG TABLET    Take 25 mg by mouth at bedtime.   EZETIMIBE (ZETIA) 10 MG TABLET    Take 10 mg by mouth daily.   FUROSEMIDE (LASIX) 20 MG TABLET    Take 1 tablet (20 mg total) by mouth daily.   IMIPRAMINE (TOFRANIL) 25 MG TABLET    Take 50 mg by mouth at bedtime. To reduce urinary frequency   LATANOPROST (XALATAN) 0.005 % OPHTHALMIC SOLUTION    Place 1 drop into both eyes every evening. UAD   LEVOTHYROXINE (SYNTHROID, LEVOTHROID) 50 MCG TABLET    Labs overdue 1 By mouth daily   LISINOPRIL (PRINIVIL,ZESTRIL) 10 MG TABLET    Take 1 tablet (10 mg total) by mouth 2 (two) times daily.   MULTIPLE VITAMIN (MULTIVITAMIN WITH MINERALS) TABS    Take 1 tablet by mouth daily. Certavite-A   MUPIROCIN CREAM (BACTROBAN) 2 %    Apply 1 application topically daily. Clean wound then apply ointment once daily, cover with dressing   NAPROXEN SODIUM (ANAPROX) 220 MG TABLET    Take 220 mg by mouth. Take one tablet daily as needed for pain   OMEPRAZOLE (PRILOSEC) 40 MG CAPSULE    Take 40 mg by mouth daily.   PINDOLOL (VISKEN) 5 MG TABLET    TAKE ONE-HALF TABLET BY MOUTH DAILY  Modified Medications   No medications on file  Discontinued Medications   No medications on file     Physical Exam: Filed Vitals:    11/09/14 1613  BP: 120/72  Pulse: 60  Temp: 95.7 F (35.4 C)  Weight: 141 lb (63.957 kg)    Physical Exam  Constitutional: She appears well-developed and well-nourished.  HENT:  Mouth/Throat: Oropharynx is clear and moist. No oropharyngeal exudate.  Bilateral hearing loss  Eyes: Conjunctivae are normal. Pupils are equal, round, and reactive to light.  Neck: Normal range of motion. Neck supple.  No JVD present.  Cardiovascular: Normal rate, regular rhythm and normal heart sounds.   Pulmonary/Chest: Effort normal and breath sounds normal.  Abdominal: Soft. Bowel sounds are normal. There is no tenderness.  Musculoskeletal: Normal range of motion. She exhibits no edema.  Large ganglion of the proximal phalanx of the right fifth digit. Some restriction in full flexion. Unstable gait.  Neurological: She is alert.  Memory loss.  Skin: Skin is warm and dry.  Discolored skin in legs and arms from multiple injuries, sun damage, and skin cancers.  wound to right shin scabbed over   Right skin tear without erythema    Psychiatric: She has a normal mood and affect.    Labs reviewed: Basic Metabolic Panel:  Recent Labs  10/07/14 0430 10/08/14 0325 10/14/14 1138  NA 142 137 136  K 3.7 4.8 4.7  CL 103 102 102  CO2 25 24 26   GLUCOSE 80 87 99  BUN 19 20 15   CREATININE 1.02 1.08 0.95  CALCIUM 8.7 8.6 9.1   Liver Function Tests:  Recent Labs  02/19/14 1232 10/06/14 1635  AST 27 34  ALT 20 29  ALKPHOS 116 137*  BILITOT 0.3 0.6  PROT 7.3 7.7  ALBUMIN 3.1* 3.2*   No results for input(s): LIPASE, AMYLASE in the last 8760 hours. No results for input(s): AMMONIA in the last 8760 hours. CBC:  Recent Labs  09/02/14 1136 09/08/14 1230 10/06/14 1635  WBC 6.4 10.4 7.8  NEUTROABS 4.3 7.6 5.0  HGB 14.6 14.0 13.2  HCT 43.0 42.4 40.2  MCV 97.7 98.6 98.0  PLT 173 182 232   TSH:  Recent Labs  12/21/13 05/24/14 1003  TSH 6.10* 4.310   A1C: Lab Results  Component Value  Date   HGBA1C 5.7 12/21/2013   Lipid Panel:  Recent Labs  03/24/14  CHOL 129  HDL 29*  LDLCALC 69  TRIG 117    Assessment/Plan 1. Open wound of knee, leg (except thigh), and ankle, complicated, right Resolved  2. Skin tear Without infection, cont mepilex dressing and clinic nurse to follow

## 2014-11-10 ENCOUNTER — Telehealth: Payer: Self-pay | Admitting: Cardiology

## 2014-11-11 NOTE — Telephone Encounter (Signed)
Closed encounter °

## 2014-11-18 ENCOUNTER — Ambulatory Visit: Payer: Medicare Other | Admitting: Cardiology

## 2014-11-29 ENCOUNTER — Ambulatory Visit: Payer: Medicare Other | Admitting: Cardiology

## 2014-12-02 ENCOUNTER — Other Ambulatory Visit: Payer: Self-pay | Admitting: Internal Medicine

## 2014-12-09 ENCOUNTER — Encounter: Payer: Self-pay | Admitting: Cardiology

## 2014-12-09 ENCOUNTER — Ambulatory Visit (INDEPENDENT_AMBULATORY_CARE_PROVIDER_SITE_OTHER): Payer: Medicare Other | Admitting: Cardiology

## 2014-12-09 VITALS — BP 120/60 | HR 48 | Ht 66.0 in | Wt 140.1 lb

## 2014-12-09 DIAGNOSIS — I471 Supraventricular tachycardia: Secondary | ICD-10-CM

## 2014-12-09 DIAGNOSIS — R0602 Shortness of breath: Secondary | ICD-10-CM

## 2014-12-09 DIAGNOSIS — I2510 Atherosclerotic heart disease of native coronary artery without angina pectoris: Secondary | ICD-10-CM

## 2014-12-09 DIAGNOSIS — I495 Sick sinus syndrome: Secondary | ICD-10-CM

## 2014-12-09 DIAGNOSIS — I5032 Chronic diastolic (congestive) heart failure: Secondary | ICD-10-CM

## 2014-12-09 DIAGNOSIS — R001 Bradycardia, unspecified: Secondary | ICD-10-CM

## 2014-12-09 NOTE — Patient Instructions (Signed)
Please follow up with Dr. Percival Spanish in 3 months

## 2014-12-09 NOTE — Progress Notes (Signed)
12/10/2014   PCP: Estill Dooms, MD   Chief Complaint  Patient presents with  . Follow-up    1 month f/u  . Shortness of Breath    on exertion  . Dizziness    Primary Cardiologist:Dr. Vita Barley   HPI: 80 year old female presented the first of Nov. after episode of acute SOB today with varying HR. She has hx of SVT and the episode was related to HRs up to 100 or greater. Once the irregular HR improved she was without complaints. She had just been hospitalized with similar episode. Problem is her bradycardia. Difficult to increase BB because this results in brady and Syncope. She has seen Dr. Lovena Le but pt really does not wish to have PPM.   Her last visit with Dr. Percival Spanish was 10/14/14 and her tachy-brady syndrome was discussed. We are attempting to balance the meds to prevent severe brady but to prevent SVT. We discussed that she may have short episodes like today, and I agree if she feels better -then best not to go to ER. But if symptoms continue then she would need to go to ER.  Echo: 10/07/14 - Left ventricle: Inferobasal hypokinesis. The cavity size was normal. Wall thickness was increased in a pattern of moderate LVH. Systolic function was normal. The estimated ejection fraction was in the range of 50% to 55%. Doppler parameters are consistent with both elevated ventricular end-diastolic filling pressure and elevated left atrial filling pressure. - Aortic valve: There was mild stenosis. Valve area (VTI): 1.77 cm^2. Valve area (Vmax): 1.67 cm^2. Valve area (Vmean): 1.71 cm^2. - Mitral valve: Calcified annulus. Mildly thickened leaflets . Valve area by continuity equation (using LVOT flow): 2.52 cm^2. - Left atrium: The atrium was mildly dilated. - Right atrium: The atrium was mildly dilated. - Atrial septum: No defect or patent foramen ovale was identified. - Pericardium, extracardiac: A trivial pericardial effusion  was identified posterior to the heart. There was a left pleural effusion.  Today she presents for follow-up from previous visit. She feels quite well and has done well without complications.  No further episodes of SVT. She has had no chest pain or shortness of breath except her chronic shortness of breath with exertion but this is not increased.  No syncope. Heart rate is 48 today it has varied in the past as well but she is asymptomatic.   No Known Allergies  Current Outpatient Prescriptions  Medication Sig Dispense Refill  . aspirin EC 81 MG tablet Take 81 mg by mouth every evening.    Marland Kitchen atorvastatin (LIPITOR) 20 MG tablet Take 20 mg by mouth daily.    . Brinzolamide-Brimonidine (SIMBRINZA) 1-0.2 % SUSP Apply to eye. One drop in both eyes twice daily    . celecoxib (CELEBREX) 200 MG capsule TAKE ONE CAPSULE BY MOUTH EVERY DAY 90 capsule 1  . diphenhydrAMINE (SOMINEX) 25 MG tablet Take 25 mg by mouth at bedtime.    . furosemide (LASIX) 20 MG tablet Take 1 tablet (20 mg total) by mouth daily. 90 tablet 3  . imipramine (TOFRANIL) 25 MG tablet Take 50 mg by mouth at bedtime. To reduce urinary frequency    . latanoprost (XALATAN) 0.005 % ophthalmic solution Place 1 drop into both eyes every evening. UAD    . levothyroxine (SYNTHROID, LEVOTHROID) 50 MCG tablet Labs overdue 1 By mouth daily 90 tablet 0  . lisinopril (PRINIVIL,ZESTRIL) 10 MG tablet Take 1 tablet (10 mg  total) by mouth 2 (two) times daily. 180 tablet 3  . Multiple Vitamin (MULTIVITAMIN WITH MINERALS) TABS Take 1 tablet by mouth daily. Certavite-A    . omeprazole (PRILOSEC) 40 MG capsule Take 40 mg by mouth daily.    . pindolol (VISKEN) 5 MG tablet TAKE ONE-HALF TABLET BY MOUTH DAILY 45 tablet 0  . ZETIA 10 MG tablet TAKE 1 TABLET BY MOUTH DAILY TO CONTROL LIPIDS. 30 tablet 5   No current facility-administered medications for this visit.    Past Medical History  Diagnosis Date  . SVT (supraventricular tachycardia)      Diagnosed 2010  . HTN (hypertension)   . Dyslipidemia   . CAD (coronary artery disease)     a. NSTEMI 2010 in setting of SVT, with cath with unsuccessful PCI - chronic total occlusion of LAD, R->L collaterals.  . GI bleeding   . Unspecified hypothyroidism   . Unspecified hereditary and idiopathic peripheral neuropathy   . Pneumonia, organism unspecified   . Basal cell carcinoma of skin of trunk, except scrotum   . Unspecified malignant neoplasm of skin of lower limb, including hip   . Basal cell carcinoma of skin of lower limb, including hip   . Unspecified glaucoma     left eye  . Other premature beats   . Other esophagitis   . Esophageal reflux   . Osteoarthrosis, unspecified whether generalized or localized, unspecified site     multiple joints  . Pain in joint, shoulder region   . Pain in joint, lower leg     knee  . Pain in joint, ankle and foot     right  . Sacroiliitis, not elsewhere classified   . Cervicalgia   . Lumbago   . Ganglion of tendon sheath   . Pathologic fracture of vertebrae   . Tietze's disease   . Insomnia, unspecified   . Disturbance of skin sensation   . Edema   . Palpitations   . Other nonspecific abnormal serum enzyme levels   . Nasal bones, closed fracture   . Closed fracture of unspecified trochanteric section of femur   . Open wound of knee, leg (except thigh), and ankle, without mention of complication   . Hip, thigh, leg, and ankle, abrasion or friction burn, without mention of infection   . Contusion of hip   . Fall from other slipping, tripping, or stumbling   . Personal history of fall   . Dyspepsia and other specified disorders of function of stomach   . Paroxysmal supraventricular tachycardia   . Acute bronchiolitis due to other infectious organisms 03/02/2013  . Other abnormal blood chemistry 03/15/2013  . Contusion of rib on left side 08/03/2013    Past Surgical History  Procedure Laterality Date  . Total hip arthroplasty Left  2001  . Breast lumpectomy Right 1995     negative for malignancy  . Cataract extraction w/ intraocular lens  implant, bilateral Bilateral 2003  . Leg skin lesion  biopsy / excision Left 08/22/2010    Lower leg shave biopsy -solar keratosis (Dr. Radford Pax)  . Joint replacement      NIO:[id number] colds or fevers, no weight changes Skin:no rashes or ulcers HEENT:no blurred vision, no congestion CV:see HPI PUL:see HPI GI:no diarrhea constipation or melena, no indigestion GU:no hematuria, no dysuria MS:no joint pain, no claudication Neuro:no syncope, no lightheadedness Endo:no diabetes, no thyroid disease  Wt Readings from Last 3 Encounters:  12/09/14 140 lb 1.6 oz (63.549 kg)  11/09/14 141  lb (63.957 kg)  10/26/14 141 lb (63.957 kg)    PHYSICAL EXAM BP 120/60 mmHg  Pulse 48  Ht 5\' 6"  (1.676 m)  Wt 140 lb 1.6 oz (63.549 kg)  BMI 22.62 kg/m2 General:Pleasant affect, NAD Skin:Warm and dry, brisk capillary refill HEENT:normocephalic, sclera clear, mucus membranes moist Neck:supple, no JVD, no bruits  Heart:S1S2 RRR with soft systolic murmur, no gallup, rub or click Lungs:clear without rales, rhonchi, or wheezes UIQ:NVVY, non tender, + BS, do not palpate liver spleen or masses Ext:no lower ext edema, 2+ pedal pulses, 2+ radial pulses Neuro:alert and oriented, MAE, follows commands, + facial symmetry  Follow up with Dr. Vita Barley in 3 months ASSESSMENT AND PLAN PSVT (paroxysmal supraventricular tachycardia) No recent episodes  Sinus bradycardia Bradycardia today but blood pressure stable and she has no lightheadedness or dizziness. Continue to monitor she is on minimal pindolol 2.5 mg daily.  She has seen Dr. Lovena Le but she prefers not to get a pacemaker. If rate is slower and she becomes symptomatic with stop pindolol and may need to readdress pacemaker again.  Tachycardia-bradycardia syndrome See above notes  SOB (shortness of breath) Chronic with exertion, none at  rest  CORONARY ATHEROSCLEROSIS NATIVE CORONARY ARTERY No chest pain  Chronic diastolic heart failure Euvolemic

## 2014-12-10 DIAGNOSIS — I5032 Chronic diastolic (congestive) heart failure: Secondary | ICD-10-CM | POA: Insufficient documentation

## 2014-12-10 NOTE — Assessment & Plan Note (Signed)
Chronic with exertion, none at rest

## 2014-12-10 NOTE — Assessment & Plan Note (Signed)
No chest pain

## 2014-12-10 NOTE — Assessment & Plan Note (Signed)
See above notes.

## 2014-12-10 NOTE — Assessment & Plan Note (Signed)
No recent episodes

## 2014-12-10 NOTE — Assessment & Plan Note (Signed)
Bradycardia today but blood pressure stable and she has no lightheadedness or dizziness. Continue to monitor she is on minimal pindolol 2.5 mg daily.  She has seen Dr. Lovena Le but she prefers not to get a pacemaker. If rate is slower and she becomes symptomatic with stop pindolol and may need to readdress pacemaker again.

## 2014-12-10 NOTE — Assessment & Plan Note (Signed)
Euvolemic. 

## 2014-12-16 ENCOUNTER — Other Ambulatory Visit: Payer: Self-pay | Admitting: Cardiology

## 2014-12-28 ENCOUNTER — Non-Acute Institutional Stay: Payer: Medicare Other | Admitting: Nurse Practitioner

## 2014-12-28 ENCOUNTER — Encounter: Payer: Self-pay | Admitting: Nurse Practitioner

## 2014-12-28 VITALS — BP 128/68 | HR 60 | Temp 97.8°F | Wt 141.0 lb

## 2014-12-28 DIAGNOSIS — J209 Acute bronchitis, unspecified: Secondary | ICD-10-CM

## 2014-12-28 DIAGNOSIS — I719 Aortic aneurysm of unspecified site, without rupture: Secondary | ICD-10-CM | POA: Insufficient documentation

## 2014-12-28 DIAGNOSIS — J438 Other emphysema: Secondary | ICD-10-CM | POA: Insufficient documentation

## 2014-12-28 DIAGNOSIS — R0981 Nasal congestion: Secondary | ICD-10-CM

## 2014-12-28 DIAGNOSIS — I2510 Atherosclerotic heart disease of native coronary artery without angina pectoris: Secondary | ICD-10-CM

## 2014-12-28 NOTE — Progress Notes (Signed)
[name], female   DOB: 07/27/1920, 80 y.o.   MRN: [id number]    Nursing Home Location:  [place] of Service: Clinic (12)  PCP: Estill Dooms, MD  No Known Allergies  Chief Complaint  Patient presents with  . Cough    when to [facility] on 12/22/14. Was given Ceftriaxone injection and Amox-Clav 875mg  bid. Clinic nurse still hears crackles.    HPI:  Patient is a 80 y.o. female seen today at [facility]. [place] for follow up urgent care visit. Pt had cough that daughter and son noticed, wanted her to be seen because she has had frequent hospitalizations due to CHF. Xray showed emphysema. Was placed on Augmentin, completes course tomorrow. Coughing up increased amounts of sputum. Worse in the morning and gets better throughout the day. No worsening of shortness of breath. No fevers. Feels like nose is stopped up. Daughter is going to get her nasal saline today.  pts husband died last week. Doing well with this transition. Daughter is here with her until Sunday and then her aids will come back during the day.  One episode of diarrhea, has resolved. Pt is eating well and sleeping good.  Review of Systems:  Review of Systems  Constitutional: Negative for fever, chills and diaphoresis.  HENT: Positive for hearing loss. Negative for congestion and sore throat.   Respiratory: Positive for cough. Negative for shortness of breath and wheezing.   Cardiovascular: Negative for chest pain, palpitations and leg swelling.       PSVT and syncopal episodes.  Gastrointestinal: Negative for nausea, diarrhea and constipation.  Musculoskeletal: Negative for myalgias, back pain and neck pain.  Skin: Negative for rash.  Neurological: Negative for tremors, seizures, weakness and headaches.  Hematological: Bruises/bleeds easily.    Past Medical History  Diagnosis Date  . SVT (supraventricular  tachycardia)     Diagnosed 2010  . HTN (hypertension)   . Dyslipidemia   . CAD (coronary artery disease)     a. NSTEMI 2010 in setting of SVT, with cath with unsuccessful PCI - chronic total occlusion of LAD, R->L collaterals.  . GI bleeding   . Unspecified hypothyroidism   . Unspecified hereditary and idiopathic peripheral neuropathy   . Pneumonia, organism unspecified   . Basal cell carcinoma of skin of trunk, except scrotum   . Unspecified malignant neoplasm of skin of lower limb, including hip   . Basal cell carcinoma of skin of lower limb, including hip   . Unspecified glaucoma     left eye  . Other premature beats   . Other esophagitis   . Esophageal reflux   . Osteoarthrosis, unspecified whether generalized or localized, unspecified site     multiple joints  . Pain in joint, shoulder region   . Pain in joint, lower leg     knee  . Pain in joint, ankle and foot     right  . Sacroiliitis, not elsewhere classified   . Cervicalgia   . Lumbago   . Ganglion of tendon sheath   . Pathologic fracture of vertebrae   . Tietze's disease   . Insomnia, unspecified   . Disturbance of skin sensation   . Edema   . Palpitations   . Other nonspecific abnormal serum enzyme levels   . Nasal bones, closed fracture   . Closed fracture of unspecified trochanteric section of femur   . Open  wound of knee, leg (except thigh), and ankle, without mention of complication   . Hip, thigh, leg, and ankle, abrasion or friction burn, without mention of infection   . Contusion of hip   . Fall from other slipping, tripping, or stumbling   . Personal history of fall   . Dyspepsia and other specified disorders of function of stomach   . Paroxysmal supraventricular tachycardia   . Acute bronchiolitis due to other infectious organisms 03/02/2013  . Other abnormal blood chemistry 03/15/2013  . Contusion of rib on left side 08/03/2013   Past Surgical History  Procedure Laterality Date  . Total hip  arthroplasty Left 2001  . Breast lumpectomy Right 1995     negative for malignancy  . Cataract extraction w/ intraocular lens  implant, bilateral Bilateral 2003  . Leg skin lesion  biopsy / excision Left 08/22/2010    Lower leg shave biopsy -solar keratosis (Dr. Radford Pax)  . Joint replacement     Social History:   reports that she has never smoked. She has never used smokeless tobacco. She reports that she drinks alcohol. She reports that she does not use illicit drugs.  Family History  Problem Relation Age of Onset  . Stroke Neg Hx   . Heart attack Neg Hx   . Heart disease Father   . Cancer Brother     lung    Medications: Patient's Medications  New Prescriptions   No medications on file  Previous Medications   ASPIRIN EC 81 MG TABLET    Take 81 mg by mouth every evening.   ATORVASTATIN (LIPITOR) 20 MG TABLET    Take 20 mg by mouth daily.   BRINZOLAMIDE-BRIMONIDINE (SIMBRINZA) 1-0.2 % SUSP    Apply to eye. One drop in both eyes twice daily   CELECOXIB (CELEBREX) 200 MG CAPSULE    TAKE ONE CAPSULE BY MOUTH EVERY DAY   DIPHENHYDRAMINE (SOMINEX) 25 MG TABLET    Take 25 mg by mouth at bedtime.   FUROSEMIDE (LASIX) 20 MG TABLET    Take 1 tablet (20 mg total) by mouth daily.   IMIPRAMINE (TOFRANIL) 25 MG TABLET    Take 50 mg by mouth at bedtime. To reduce urinary frequency   LATANOPROST (XALATAN) 0.005 % OPHTHALMIC SOLUTION    Place 1 drop into both eyes every evening. UAD   LEVOTHYROXINE (SYNTHROID, LEVOTHROID) 50 MCG TABLET    Labs overdue 1 By mouth daily   LISINOPRIL (PRINIVIL,ZESTRIL) 10 MG TABLET    Take 1 tablet (10 mg total) by mouth 2 (two) times daily.   MULTIPLE VITAMIN (MULTIVITAMIN WITH MINERALS) TABS    Take 1 tablet by mouth daily. Certavite-A   OMEPRAZOLE (PRILOSEC) 40 MG CAPSULE    Take 40 mg by mouth daily.   PINDOLOL (VISKEN) 5 MG TABLET    TAKE ONE-HALF TABLET BY MOUTH DAILY   ZETIA 10 MG TABLET    TAKE 1 TABLET BY MOUTH DAILY TO CONTROL LIPIDS.  Modified  Medications   No medications on file  Discontinued Medications   No medications on file     Physical Exam: Filed Vitals:   12/28/14 0919  BP: 128/68  Pulse: 60  Temp: 97.8 F (36.6 C)  TempSrc: Oral  Weight: 141 lb (63.957 kg)  SpO2: 98%    Physical Exam  Constitutional: She appears well-developed and well-nourished.  HENT:  Mouth/Throat: Oropharynx is clear and moist. No oropharyngeal exudate.  Bilateral hearing loss  Eyes: Conjunctivae are normal. Pupils are equal, round,  and reactive to light.  Neck: Normal range of motion. Neck supple. No JVD present.  Cardiovascular: Normal rate, regular rhythm and normal heart sounds.   Pulmonary/Chest: Effort normal and breath sounds normal.  Abdominal: Soft. Bowel sounds are normal.  Musculoskeletal: Normal range of motion. She exhibits no edema.  Unstable gait.  Neurological: She is alert.  Memory loss.  Skin: Skin is warm and dry.  Psychiatric: She has a normal mood and affect.    Labs reviewed: Basic Metabolic Panel:  Recent Labs  10/07/14 0430 10/08/14 0325 10/14/14 1138 10/27/14  NA 142 137 136 138  K 3.7 4.8 4.7 4.0  CL 103 102 102  --   CO2 25 24 26   --   GLUCOSE 80 87 99  --   BUN 19 20 15 21   CREATININE 1.02 1.08 0.95 1.0  CALCIUM 8.7 8.6 9.1  --    Liver Function Tests:  Recent Labs  02/19/14 1232 10/06/14 1635  AST 27 34  ALT 20 29  ALKPHOS 116 137*  BILITOT 0.3 0.6  PROT 7.3 7.7  ALBUMIN 3.1* 3.2*   No results for input(s): LIPASE, AMYLASE in the last 8760 hours. No results for input(s): AMMONIA in the last 8760 hours. CBC:  Recent Labs  09/02/14 1136 09/08/14 1230 10/06/14 1635  WBC 6.4 10.4 7.8  NEUTROABS 4.3 7.6 5.0  HGB 14.6 14.0 13.2  HCT 43.0 42.4 40.2  MCV 97.7 98.6 98.0  PLT 173 182 232   TSH:  Recent Labs  05/24/14 1003  TSH 4.310   A1C: Lab Results  Component Value Date   HGBA1C 5.7 12/21/2013   Lipid Panel:  Recent Labs  03/24/14  CHOL 129  HDL 29*    LDLCALC 69  TRIG 117    Assessment/Plan  1. Acute bronchitis, unspecified organism Overall doing better, conts on Augmentin until tomorrow may use mucinex DM BID with full glass of water for cough and congestion Yogurt or probiotics for a few days to prevent diarrhea  2. Emphysema New; found on Chest Xray, no hx of smoking or COPD, no worsening shortness of breath Will cont to follow   3. Nasal congestion Nasal problems since fall with nose fracture.  Daughter unsure of follow up with ENT May use nasal saline as needed for congestion. Follow up with ENT if needed  4. Aortic aneurysm Possible thoracic aortic aneurysm found on xray, no further work up requested by pt or daughters therefore will no further work up warranted.

## 2015-01-03 ENCOUNTER — Ambulatory Visit: Payer: Medicare Other | Admitting: Cardiology

## 2015-01-05 ENCOUNTER — Non-Acute Institutional Stay: Payer: Medicare Other | Admitting: Adult Health

## 2015-01-05 DIAGNOSIS — R630 Anorexia: Secondary | ICD-10-CM

## 2015-01-05 DIAGNOSIS — I495 Sick sinus syndrome: Secondary | ICD-10-CM

## 2015-01-05 NOTE — Assessment & Plan Note (Signed)
She did not appear dehydrated during the exam with moist mucous membranes, no skin tenting, no dizziness, and her BP was on the high side.  Apparently the home care staff manager was concerned but the caregiver that is with her today reported that she ate a good breakfast and lunch and is drinking adequately. I encouraged [name] to drink more fluid and to return to the clinic if she experiences any dizziness upon standing and inadequate intake.

## 2015-01-05 NOTE — Progress Notes (Signed)
[name], female   DOB: 1920-07-09, 81 y.o.   MRN: [id number]   Nursing Home Location:  [place] of Service: Clinic (12)  PCP: Estill Dooms, MD  No Known Allergies  Chief Complaint  Patient presents with  . Acute Visit    home care concerned for dehydration    HPI:  Patient is a 81 y.o. female seen today at [facility]. [place] pt is here for evaluation of dehydration. The home care manager apparently was concerned that she was not eating and drinking enough. The caregiver that is with her today has not noticed a problem. She was able to report that she had a muffin and oatmeal for breakfast with coffee, as well as an adequate lunch with water. She denies any dizziness upon standing or dark urine. She was treated for bronchitis a few weeks ago and reports that those symptoms have resolved. Her husband recently passed away.    Review of Systems:  Review of Systems  Constitutional: Negative for fever, chills, diaphoresis and appetite change.  HENT: Positive for hearing loss. Negative for congestion and sore throat.   Respiratory: Negative for cough, shortness of breath and wheezing.   Cardiovascular: Negative for chest pain, palpitations and leg swelling.       PSVT and syncopal episodes.  Gastrointestinal: Negative for nausea, diarrhea and constipation.  Endocrine: Negative for polydipsia, polyphagia and polyuria.  Genitourinary: Negative for dysuria and difficulty urinating.  Musculoskeletal: Negative for myalgias, back pain and neck pain.  Skin: Negative for rash.  Neurological: Negative for dizziness, tremors, seizures, weakness and headaches.  Hematological: Bruises/bleeds easily.  Psychiatric/Behavioral: Negative for dysphoric mood.    Past Medical History  Diagnosis Date  . SVT (supraventricular tachycardia)     Diagnosed 2010  . HTN (hypertension)   . Dyslipidemia   . CAD  (coronary artery disease)     a. NSTEMI 2010 in setting of SVT, with cath with unsuccessful PCI - chronic total occlusion of LAD, R->L collaterals.  . GI bleeding   . Unspecified hypothyroidism   . Unspecified hereditary and idiopathic peripheral neuropathy   . Pneumonia, organism unspecified   . Basal cell carcinoma of skin of trunk, except scrotum   . Unspecified malignant neoplasm of skin of lower limb, including hip   . Basal cell carcinoma of skin of lower limb, including hip   . Unspecified glaucoma     left eye  . Other premature beats   . Other esophagitis   . Esophageal reflux   . Osteoarthrosis, unspecified whether generalized or localized, unspecified site     multiple joints  . Pain in joint, shoulder region   . Pain in joint, lower leg     knee  . Pain in joint, ankle and foot     right  . Sacroiliitis, not elsewhere classified   . Cervicalgia   . Lumbago   . Ganglion of tendon sheath   . Pathologic fracture of vertebrae   . Tietze's disease   . Insomnia, unspecified   . Disturbance of skin sensation   . Edema   . Palpitations   . Other nonspecific abnormal serum enzyme levels   . Nasal bones, closed fracture   . Closed fracture of unspecified trochanteric section of femur   . Open wound of knee, leg (except thigh), and ankle, without mention of complication   . Hip, thigh, leg, and ankle, abrasion or friction burn,  without mention of infection   . Contusion of hip   . Fall from other slipping, tripping, or stumbling   . Personal history of fall   . Dyspepsia and other specified disorders of function of stomach   . Paroxysmal supraventricular tachycardia   . Acute bronchiolitis due to other infectious organisms 03/02/2013  . Other abnormal blood chemistry 03/15/2013  . Contusion of rib on left side 08/03/2013   Past Surgical History  Procedure Laterality Date  . Total hip arthroplasty Left 2001  . Breast lumpectomy Right 1995     negative for malignancy  .  Cataract extraction w/ intraocular lens  implant, bilateral Bilateral 2003  . Leg skin lesion  biopsy / excision Left 08/22/2010    Lower leg shave biopsy -solar keratosis (Dr. Radford Pax)  . Joint replacement     Social History:   reports that she has never smoked. She has never used smokeless tobacco. She reports that she drinks alcohol. She reports that she does not use illicit drugs.  Family History  Problem Relation Age of Onset  . Stroke Neg Hx   . Heart attack Neg Hx   . Heart disease Father   . Cancer Brother     lung    Medications: Patient's Medications  New Prescriptions   No medications on file  Previous Medications   ASPIRIN EC 81 MG TABLET    Take 81 mg by mouth every evening.   ATORVASTATIN (LIPITOR) 20 MG TABLET    Take 20 mg by mouth daily.   BRINZOLAMIDE-BRIMONIDINE (SIMBRINZA) 1-0.2 % SUSP    Apply to eye. One drop in both eyes twice daily   CELECOXIB (CELEBREX) 200 MG CAPSULE    TAKE ONE CAPSULE BY MOUTH EVERY DAY   DIPHENHYDRAMINE (SOMINEX) 25 MG TABLET    Take 25 mg by mouth at bedtime.   FUROSEMIDE (LASIX) 20 MG TABLET    Take 1 tablet (20 mg total) by mouth daily.   IMIPRAMINE (TOFRANIL) 25 MG TABLET    Take 50 mg by mouth at bedtime. To reduce urinary frequency   LATANOPROST (XALATAN) 0.005 % OPHTHALMIC SOLUTION    Place 1 drop into both eyes every evening. UAD   LEVOTHYROXINE (SYNTHROID, LEVOTHROID) 50 MCG TABLET    Labs overdue 1 By mouth daily   LISINOPRIL (PRINIVIL,ZESTRIL) 10 MG TABLET    Take 1 tablet (10 mg total) by mouth 2 (two) times daily.   MULTIPLE VITAMIN (MULTIVITAMIN WITH MINERALS) TABS    Take 1 tablet by mouth daily. Certavite-A   OMEPRAZOLE (PRILOSEC) 40 MG CAPSULE    Take 40 mg by mouth daily.   PINDOLOL (VISKEN) 5 MG TABLET    TAKE ONE-HALF TABLET BY MOUTH DAILY   ZETIA 10 MG TABLET    TAKE 1 TABLET BY MOUTH DAILY TO CONTROL LIPIDS.  Modified Medications   No medications on file  Discontinued Medications   No medications on file      Physical Exam: Filed Vitals:   01/05/15 1332  BP: 152/70  Pulse: 46  Temp: 97.2 F (36.2 C)  Resp: 20  SpO2: 96%    Physical Exam  Constitutional: She appears well-developed and well-nourished.  HENT:  Mouth/Throat: Oropharynx is clear and moist. No oropharyngeal exudate.  Bilateral hearing loss. Moist mucous membranes  Eyes: Conjunctivae are normal. Pupils are equal, round, and reactive to light.  Neck: Normal range of motion. Neck supple. No JVD present.  Cardiovascular: Regular rhythm and normal heart sounds.   No murmur  heard. Venous stasis changes to skin, BLE. No edema  Pulmonary/Chest: Effort normal and breath sounds normal.  Abdominal: Soft. Bowel sounds are normal.  Musculoskeletal: Normal range of motion. She exhibits no edema.  Unstable gait.  Neurological: She is alert.  Memory loss.  Skin: Skin is warm and dry.  No skin tenting.  Psychiatric: She has a normal mood and affect.    Labs reviewed: Basic Metabolic Panel:  Recent Labs  10/07/14 0430 10/08/14 0325 10/14/14 1138 10/27/14  NA 142 137 136 138  K 3.7 4.8 4.7 4.0  CL 103 102 102  --   CO2 25 24 26   --   GLUCOSE 80 87 99  --   BUN 19 20 15 21   CREATININE 1.02 1.08 0.95 1.0  CALCIUM 8.7 8.6 9.1  --    Liver Function Tests:  Recent Labs  02/19/14 1232 10/06/14 1635  AST 27 34  ALT 20 29  ALKPHOS 116 137*  BILITOT 0.3 0.6  PROT 7.3 7.7  ALBUMIN 3.1* 3.2*   No results for input(s): LIPASE, AMYLASE in the last 8760 hours. No results for input(s): AMMONIA in the last 8760 hours. CBC:  Recent Labs  09/02/14 1136 09/08/14 1230 10/06/14 1635  WBC 6.4 10.4 7.8  NEUTROABS 4.3 7.6 5.0  HGB 14.6 14.0 13.2  HCT 43.0 42.4 40.2  MCV 97.7 98.6 98.0  PLT 173 182 232   TSH:  Recent Labs  05/24/14 1003  TSH 4.310   A1C: Lab Results  Component Value Date   HGBA1C 5.7 12/21/2013   Lipid Panel:  Recent Labs  03/24/14  CHOL 129  HDL 29*  LDLCALC 69  TRIG 117     Assessment/Plan  Poor appetite She did not appear dehydrated during the exam with moist mucous membranes, no skin tenting, no dizziness, and her BP was on the high side.  Apparently the home care staff manager was concerned but the caregiver that is with her today reported that she ate a good breakfast and lunch and is drinking adequately. I encouraged Ms. Daudelin to drink more fluid and to return to the clinic if she experiences any dizziness upon standing and inadequate intake.   Tachycardia-bradycardia syndrome Her HR ranged 46-50 today and she was asymptomatic. This issue is followed by Dr. Percival Spanish with cardiology. She does not want a pacemaker placed and so this issue is being managed conservatively. I encouraged her to report any SOB, CP, DOE, or dizziness/syncope to the cardiologist   Cindi Carbon, [place] [phone]

## 2015-01-05 NOTE — Assessment & Plan Note (Signed)
Her HR ranged 46-50 today and she was asymptomatic. This issue is followed by Dr. Percival Spanish with cardiology. She does not want a pacemaker placed and so this issue is being managed conservatively. I encouraged her to report any SOB, CP, DOE, or dizziness/syncope to the cardiologist

## 2015-01-30 ENCOUNTER — Other Ambulatory Visit: Payer: Self-pay | Admitting: Internal Medicine

## 2015-02-10 ENCOUNTER — Emergency Department (HOSPITAL_COMMUNITY): Payer: Medicare Other

## 2015-02-10 ENCOUNTER — Encounter: Payer: Self-pay | Admitting: Nurse Practitioner

## 2015-02-10 ENCOUNTER — Emergency Department (HOSPITAL_COMMUNITY)
Admission: EM | Admit: 2015-02-10 | Discharge: 2015-02-10 | Disposition: A | Discharge status: Home / Self Care | Payer: Medicare Other | Attending: Emergency Medicine | Admitting: Emergency Medicine

## 2015-02-10 ENCOUNTER — Encounter (HOSPITAL_COMMUNITY): Payer: Self-pay | Admitting: *Deleted

## 2015-02-10 DIAGNOSIS — S79912A Unspecified injury of left hip, initial encounter: Secondary | ICD-10-CM | POA: Diagnosis present

## 2015-02-10 DIAGNOSIS — I2510 Atherosclerotic heart disease of native coronary artery without angina pectoris: Secondary | ICD-10-CM | POA: Insufficient documentation

## 2015-02-10 DIAGNOSIS — Y9301 Activity, walking, marching and hiking: Secondary | ICD-10-CM | POA: Insufficient documentation

## 2015-02-10 DIAGNOSIS — Z79899 Other long term (current) drug therapy: Secondary | ICD-10-CM | POA: Insufficient documentation

## 2015-02-10 DIAGNOSIS — Z7982 Long term (current) use of aspirin: Secondary | ICD-10-CM | POA: Diagnosis not present

## 2015-02-10 DIAGNOSIS — Y998 Other external cause status: Secondary | ICD-10-CM | POA: Insufficient documentation

## 2015-02-10 DIAGNOSIS — R52 Pain, unspecified: Secondary | ICD-10-CM

## 2015-02-10 DIAGNOSIS — S7002XA Contusion of left hip, initial encounter: Secondary | ICD-10-CM | POA: Diagnosis not present

## 2015-02-10 DIAGNOSIS — I10 Essential (primary) hypertension: Secondary | ICD-10-CM | POA: Diagnosis not present

## 2015-02-10 DIAGNOSIS — Y92009 Unspecified place in unspecified non-institutional (private) residence as the place of occurrence of the external cause: Secondary | ICD-10-CM | POA: Diagnosis not present

## 2015-02-10 DIAGNOSIS — W1839XA Other fall on same level, initial encounter: Secondary | ICD-10-CM | POA: Insufficient documentation

## 2015-02-10 DIAGNOSIS — W19XXXA Unspecified fall, initial encounter: Secondary | ICD-10-CM

## 2015-02-10 HISTORY — DX: Essential (primary) hypertension: I10

## 2015-02-10 HISTORY — DX: Atherosclerotic heart disease of native coronary artery without angina pectoris: I25.10

## 2015-02-10 MED ORDER — HYDROCODONE-ACETAMINOPHEN 5-325 MG PO TABS: 1.0000 | ORAL_TABLET | Freq: Four times a day (QID) | ORAL | Status: AC | PRN

## 2015-02-10 MED ORDER — HYDROCODONE-ACETAMINOPHEN 5-325 MG PO TABS
1.0000 | ORAL_TABLET | Freq: Once | ORAL | Status: AC
  Administered 2015-02-10: 1 via ORAL
  Filled 2015-02-10: qty 1

## 2015-02-10 NOTE — ED Notes (Signed)
Per EMS, pt fell today and now complains of left hip pain with movement. Pt states she lost balance while walking, pt is supposed to use walker but does not use it. Pt denies pain in any other areas.

## 2015-02-10 NOTE — ED Provider Notes (Signed)
CSN: [id number]     Arrival date & time 02/10/15  1439 History   First MD Initiated Contact with Patient 02/10/15 1500     Chief Complaint  Patient presents with  . Fall  . Hip Pain     (Consider location/radiation/quality/duration/timing/severity/associated sxs/prior Treatment) HPI  81 year old female presents after twisting and falling at her house. She states she was turning quickly while on her feet and her left leg seemed to drag behind them cause her to fall. Her left hip hurts ever since this. She thinks she landed on the hip. She did not hit her head. No loss of consciousness. Did not feel dizzy or lightheaded at any time. Pain is minimal to none while at rest but any movement of the hip causes severe pain. Was unable to get back up.   Past Medical History  Diagnosis Date  . Hypertension   . Coronary artery disease    Past Surgical History  Procedure Laterality Date  . Eye surgery     No family history on file. History  Substance Use Topics  . Smoking status: Never Smoker   . Smokeless tobacco: Not on file  . Alcohol Use: No   OB History    No data available     Review of Systems  Musculoskeletal: Positive for arthralgias.  Skin: Negative for wound.  Neurological: Negative for syncope, weakness, numbness and headaches.  All other systems reviewed and are negative.     Allergies  Review of patient's allergies indicates no known allergies.  Home Medications   Prior to Admission medications   Medication Sig Start Date End Date Taking? Authorizing Provider  aspirin 81 MG tablet Take 81 mg by mouth every evening.   Yes Historical Provider, MD  atorvastatin (LIPITOR) 20 MG tablet Take 20 mg by mouth every evening.   Yes Historical Provider, MD  Brinzolamide-Brimonidine 1-0.2 % SUSP Place 1 drop into both eyes 2 (two) times daily.   Yes Historical Provider, MD  celecoxib (CELEBREX) 200 MG capsule Take 200 mg by mouth 2 (two) times daily.   Yes Historical  Provider, MD  diphenhydrAMINE (BENADRYL) 25 MG tablet Take 25 mg by mouth at bedtime.   Yes Historical Provider, MD  ezetimibe (ZETIA) 10 MG tablet Take 10 mg by mouth daily.   Yes Historical Provider, MD  furosemide (LASIX) 20 MG tablet Take 20 mg by mouth daily.   Yes Historical Provider, MD  imipramine (TOFRANIL) 25 MG tablet Take 50 mg by mouth at bedtime.   Yes Historical Provider, MD  latanoprost (XALATAN) 0.005 % ophthalmic solution Place 1 drop into both eyes at bedtime.   Yes Historical Provider, MD  levothyroxine (SYNTHROID, LEVOTHROID) 50 MCG tablet Take 50 mcg by mouth daily before breakfast.   Yes Historical Provider, MD  lisinopril (PRINIVIL,ZESTRIL) 10 MG tablet Take 10 mg by mouth 2 (two) times daily.   Yes Historical Provider, MD  Multiple Vitamins-Minerals (MULTIVITAMIN & MINERAL PO) Take 1 tablet by mouth daily.   Yes Historical Provider, MD  omeprazole (PRILOSEC) 20 MG capsule Take 40 mg by mouth daily.    Yes Historical Provider, MD  pindolol (VISKEN) 5 MG tablet Take 2.5 mg by mouth daily.   Yes Historical Provider, MD   BP 163/52 mmHg  Pulse 50  Temp(Src) 97 F (36.1 C) (Oral)  Resp 16  SpO2 97% Physical Exam  Constitutional: She is oriented to person, place, and time. She appears well-developed and well-nourished. No distress.  HENT:  Head: Normocephalic  and atraumatic.  Right Ear: External ear normal.  Left Ear: External ear normal.  Nose: Nose normal.  Eyes: Right eye exhibits no discharge. Left eye exhibits no discharge.  Cardiovascular: Normal rate, regular rhythm and normal heart sounds.   Pulmonary/Chest: Effort normal and breath sounds normal.  Abdominal: Soft. There is no tenderness.  Musculoskeletal:       Left hip: She exhibits decreased range of motion and tenderness (lateral).       Left knee: No tenderness found.       Left upper leg: She exhibits no tenderness.  NV intact in distal LLE  Neurological: She is alert and oriented to person, place,  and time.  Skin: Skin is warm and dry. She is not diaphoretic.  Nursing note and vitals reviewed.   ED Course  Procedures (including critical care time) Labs Review Labs Reviewed - No data to display  Imaging Review Ct Hip Left Wo Contrast  02/10/2015   CLINICAL DATA:  Fall onto floor. Left hip pain. Previous left hip arthroplasty. Evaluate for occult hip fracture.  EXAM: CT OF THE LEFT HIP WITHOUT CONTRAST  TECHNIQUE: Multidetector CT imaging of the left hip was performed according to the standard protocol. Multiplanar CT image reconstructions were also generated.  COMPARISON:  None.  FINDINGS: Bipolar left hip prosthesis is seen in expected position. No evidence of acute left hip fracture or dislocation. No evidence hardware failure or loosening. Generalized osteopenia noted. Pubic symphysis degenerative changes noted.  IMPRESSION: Bipolar left hip prosthesis in expected position. No evidence of acute left hip fracture, dislocation, or other acute findings.  Osteopenia and pubic symphysis degenerative changes.   Electronically Signed   By: Earle Gell M.D.   On: 02/10/2015 17:38   Dg Hip Unilat With Pelvis 2-3 Views Left  02/10/2015   CLINICAL DATA:  Fall today and complains of left hip pain with movement.  EXAM: LEFT HIP (WITH PELVIS) 2-3 VIEWS  COMPARISON:  None.  FINDINGS: There is a total left hip arthroplasty. Lucency around the acetabular component could be related to small particle disease. No evidence for a periprosthetic fracture. The left hip arthroplasty is located. Mild joint space narrowing and degenerative changes in the right hip joint. Pelvic bony ring is intact.  IMPRESSION: No acute bone abnormality to the pelvis or left hip.   Electronically Signed   By: Markus Daft M.D.   On: 02/10/2015 15:51     EKG Interpretation None      MDM   Final diagnoses:  Fall, initial encounter  Contusion of left hip, initial encounter    Patient with a mechanical fall, no fracture seen  on x-ray. Is able to a bili with a walker but with a severe limp. Due to this CT obtained that shows no periprosthetic fracture. Patient's pain is somewhat improved, given no syncope or chest pain and she is stable for discharge back to her facility. She has 24-hour care and should be only get into rehabilitation at her facility.    Ephraim Hamburger, MD 02/10/15 [phone]

## 2015-02-10 NOTE — ED Notes (Signed)
MD at bedside. 

## 2015-02-10 NOTE — ED Notes (Signed)
Will recheck vitals after X-Ray.

## 2015-02-10 NOTE — ED Notes (Signed)
Bed: [facility] Expected date:  Expected time:  Means of arrival:  Comments: EMS fall  

## 2015-02-10 NOTE — ED Notes (Signed)
Pt walked to bathroom with a walker. Pt complained of right leg pain and she was breathing rapidly.

## 2015-02-13 ENCOUNTER — Encounter: Payer: Self-pay | Admitting: Internal Medicine

## 2015-02-14 ENCOUNTER — Encounter: Payer: Medicare Other | Admitting: Internal Medicine

## 2015-02-15 ENCOUNTER — Non-Acute Institutional Stay (SKILLED_NURSING_FACILITY): Payer: Medicare Other | Admitting: Internal Medicine

## 2015-02-15 ENCOUNTER — Encounter: Payer: Self-pay | Admitting: Internal Medicine

## 2015-02-15 DIAGNOSIS — E039 Hypothyroidism, unspecified: Secondary | ICD-10-CM

## 2015-02-15 DIAGNOSIS — K219 Gastro-esophageal reflux disease without esophagitis: Secondary | ICD-10-CM

## 2015-02-15 DIAGNOSIS — I10 Essential (primary) hypertension: Secondary | ICD-10-CM

## 2015-02-15 DIAGNOSIS — I5032 Chronic diastolic (congestive) heart failure: Secondary | ICD-10-CM

## 2015-02-15 DIAGNOSIS — H409 Unspecified glaucoma: Secondary | ICD-10-CM

## 2015-02-15 DIAGNOSIS — S7002XD Contusion of left hip, subsequent encounter: Secondary | ICD-10-CM | POA: Diagnosis not present

## 2015-02-15 DIAGNOSIS — R739 Hyperglycemia, unspecified: Secondary | ICD-10-CM

## 2015-02-15 DIAGNOSIS — I2510 Atherosclerotic heart disease of native coronary artery without angina pectoris: Secondary | ICD-10-CM | POA: Diagnosis not present

## 2015-02-15 DIAGNOSIS — R269 Unspecified abnormalities of gait and mobility: Secondary | ICD-10-CM | POA: Diagnosis not present

## 2015-02-15 NOTE — Progress Notes (Signed)
[name], female   DOB: 11/06/1920, 81 y.o.   MRN: [id number]  Provider:  Rexene Edison. Mariea [name], D.O., C.M.D.  Location:  [facility]  PCP: Hollace Kinnier, DO  Code Status: DNR, Advanced Directive information Does patient have an advance directive?: Yes, Type of Advance Directive: Out of facility DNR (pink MOST or yellow form);[place];Living will, Pre-existing out of facility DNR order (yellow form or pink MOST form): Yellow form placed in chart (order not valid for inpatient use)   No Known Allergies  Chief Complaint  Patient presents with  . New Admit To SNF    HPI: 81 y.o. female with h/o OA, HTN, hyperlipidemia, hyperglycemia, gait disorder, hypothyroidism, CAD, GERD, glaucoma was admitted to short term rehab s/p fall.  She was walking and turned quickly, her left leg dragged behind her and she fell.  She did not hit her head, lose consciousness, or get dizzy or lightheaded.  She was unable to get up on her own, and has had severe pain with movement of the left hip, but minimal pain at rest.  She underwent xrays and a CT which revealed no fractures.  She has now been getting hydrocodone for pain in addition to her celebrex.     ROS: Review of Systems  Constitutional: Positive for malaise/fatigue. Negative for fever and chills.  HENT: Positive for hearing loss.   Eyes:       Glaucoma  Respiratory: Negative for shortness of breath.   Cardiovascular: Negative for chest pain, palpitations and leg swelling.       CAD  Gastrointestinal: Positive for heartburn. Negative for abdominal pain.  Genitourinary: Negative for dysuria.  Musculoskeletal: Positive for joint pain and falls. Negative for back pain.       Shuffling gait  Neurological: Positive for weakness. Negative for dizziness and loss of consciousness.       Aphasia, dysarthria  Psychiatric/Behavioral: Positive for memory loss.     Past Medical History  Diagnosis Date  . SVT  (supraventricular tachycardia)     Diagnosed 2010  . HTN (hypertension)   . Dyslipidemia   . CAD (coronary artery disease)     a. NSTEMI 2010 in setting of SVT, with cath with unsuccessful PCI - chronic total occlusion of LAD, R->L collaterals.  . GI bleeding   . Unspecified hypothyroidism   . Unspecified hereditary and idiopathic peripheral neuropathy   . Pneumonia, organism unspecified   . Basal cell carcinoma of skin of trunk, except scrotum   . Unspecified malignant neoplasm of skin of lower limb, including hip   . Basal cell carcinoma of skin of lower limb, including hip   . Unspecified glaucoma     left eye  . Other premature beats   . Other esophagitis   . Esophageal reflux   . Osteoarthrosis, unspecified whether generalized or localized, unspecified site     multiple joints  . Pain in joint, shoulder region   . Pain in joint, lower leg     knee  . Pain in joint, ankle and foot     right  . Sacroiliitis, not elsewhere classified   . Cervicalgia   . Lumbago   . Ganglion of tendon sheath   . Pathologic fracture of vertebrae   . Tietze's disease   . Insomnia, unspecified   . Disturbance of skin sensation   . Edema   . Palpitations   . Other nonspecific abnormal serum enzyme levels   . Nasal bones,  closed fracture   . Closed fracture of unspecified trochanteric section of femur   . Open wound of knee, leg (except thigh), and ankle, without mention of complication   . Hip, thigh, leg, and ankle, abrasion or friction burn, without mention of infection   . Contusion of hip   . Fall from other slipping, tripping, or stumbling   . Personal history of fall   . Dyspepsia and other specified disorders of function of stomach   . Paroxysmal supraventricular tachycardia   . Acute bronchiolitis due to other infectious organisms 03/02/2013  . Other abnormal blood chemistry 03/15/2013  . Contusion of rib on left side 08/03/2013  . Hypertension   . Coronary artery disease    Past  Surgical History  Procedure Laterality Date  . Total hip arthroplasty Left 2001  . Breast lumpectomy Right 1995     negative for malignancy  . Cataract extraction w/ intraocular lens  implant, bilateral Bilateral 2003  . Leg skin lesion  biopsy / excision Left 08/22/2010    Lower leg shave biopsy -solar keratosis (Dr. Radford Pax)  . Joint replacement    . Eye surgery     Social History:   reports that she has never smoked. She does not have any smokeless tobacco history on file. She reports that she does not drink alcohol or use illicit drugs.  Family History  Problem Relation Age of Onset  . Stroke Neg Hx   . Heart attack Neg Hx   . Heart disease Father   . Cancer Brother     lung    Medications: Patient's Medications  New Prescriptions   No medications on file  Previous Medications   ASPIRIN EC 81 MG TABLET    Take 81 mg by mouth every evening.   ATORVASTATIN (LIPITOR) 20 MG TABLET    TAKE 1 TABLET BY MOUTH DAILY TO LOWER LIPIDS   BRINZOLAMIDE-BRIMONIDINE (SIMBRINZA) 1-0.2 % SUSP    Apply to eye. One drop in both eyes twice daily   CELECOXIB (CELEBREX) 200 MG CAPSULE    TAKE ONE CAPSULE BY MOUTH EVERY DAY   EZETIMIBE (ZETIA) 10 MG TABLET    Take 10 mg by mouth daily.   FUROSEMIDE (LASIX) 20 MG TABLET    Take 20 mg by mouth daily.   HYDROCODONE-ACETAMINOPHEN (NORCO) 5-325 MG PER TABLET    Take 1 tablet by mouth every 6 (six) hours as needed for severe pain.   IMIPRAMINE (TOFRANIL) 25 MG TABLET    Take 50 mg by mouth at bedtime. To reduce urinary frequency   LATANOPROST (XALATAN) 0.005 % OPHTHALMIC SOLUTION    Place 1 drop into both eyes every evening. UAD   LEVOTHYROXINE (SYNTHROID, LEVOTHROID) 50 MCG TABLET    Labs overdue 1 By mouth daily   LISINOPRIL (PRINIVIL,ZESTRIL) 10 MG TABLET    Take 1 tablet (10 mg total) by mouth 2 (two) times daily.   MULTIPLE VITAMIN (MULTIVITAMIN WITH MINERALS) TABS    Take 1 tablet by mouth daily. Certavite-A   OMEPRAZOLE (PRILOSEC) 20 MG CAPSULE     Take 40 mg by mouth daily.    PINDOLOL (VISKEN) 5 MG TABLET    Take 2.5 mg by mouth daily.  Modified Medications   No medications on file  Discontinued Medications   ASPIRIN 81 MG TABLET    Take 81 mg by mouth every evening.   ATORVASTATIN (LIPITOR) 20 MG TABLET    Take 20 mg by mouth daily.   ATORVASTATIN (LIPITOR) 20 MG  TABLET    Take 20 mg by mouth every evening.   BRINZOLAMIDE-BRIMONIDINE 1-0.2 % SUSP    Place 1 drop into both eyes 2 (two) times daily.   CELECOXIB (CELEBREX) 200 MG CAPSULE    Take 200 mg by mouth 2 (two) times daily.   DIPHENHYDRAMINE (BENADRYL) 25 MG TABLET    Take 25 mg by mouth at bedtime.   DIPHENHYDRAMINE (SOMINEX) 25 MG TABLET    Take 25 mg by mouth at bedtime.   FUROSEMIDE (LASIX) 20 MG TABLET    Take 1 tablet (20 mg total) by mouth daily.   IMIPRAMINE (TOFRANIL) 25 MG TABLET    Take 50 mg by mouth at bedtime.   LATANOPROST (XALATAN) 0.005 % OPHTHALMIC SOLUTION    Place 1 drop into both eyes at bedtime.   LEVOTHYROXINE (SYNTHROID, LEVOTHROID) 50 MCG TABLET    Take 50 mcg by mouth daily before breakfast.   LISINOPRIL (PRINIVIL,ZESTRIL) 10 MG TABLET    Take 10 mg by mouth 2 (two) times daily.   MULTIPLE VITAMINS-MINERALS (MULTIVITAMIN & MINERAL PO)    Take 1 tablet by mouth daily.   OMEPRAZOLE (PRILOSEC) 40 MG CAPSULE    Take 40 mg by mouth daily.   PINDOLOL (VISKEN) 5 MG TABLET    TAKE ONE-HALF TABLET BY MOUTH DAILY   ZETIA 10 MG TABLET    TAKE 1 TABLET BY MOUTH DAILY TO CONTROL LIPIDS.    Physical Exam: Filed Vitals:   02/15/15 1021  BP: 140/68  Pulse: 51  Temp: 96.8 F (36 C)  Resp: 16  Height: 5\' 5"  (1.651 m)  Weight: 144 lb (65.318 kg)  SpO2: 97%   Physical Exam  Constitutional: No distress.  Thin White female  HENT:  Head: Normocephalic and atraumatic.  Right Ear: External ear normal.  Left Ear: External ear normal.  Nose: Nose normal.  Mouth/Throat: Oropharynx is clear and moist.  Eyes: Conjunctivae and EOM are normal. Pupils are equal,  round, and reactive to light.  glasses  Neck: Normal range of motion. Neck supple. No JVD present. No thyromegaly present.  Cardiovascular: Normal rate, regular rhythm and intact distal pulses.   Murmur heard. Pulmonary/Chest: Effort normal and breath sounds normal. She has no rales.  Abdominal: Soft. Bowel sounds are normal. She exhibits no distension and no mass. There is no tenderness.  Musculoskeletal: She exhibits tenderness.  Left groin tenderness; shuffles when walking; difficulty lifting left leg secondary to pain with hip flexion  Lymphadenopathy:    She has no cervical adenopathy.  Neurological: She is alert.  Dysarthric and some aphasia noted  Skin: Skin is warm and dry.  Ecchymoses of left hip and groin; scar on right side of face  Psychiatric: She has a normal mood and affect.     Labs reviewed: Basic Metabolic Panel:  Recent Labs  10/07/14 0430 10/08/14 0325 10/14/14 1138 10/27/14  NA 142 137 136 138  K 3.7 4.8 4.7 4.0  CL 103 102 102  --   CO2 25 24 26   --   GLUCOSE 80 87 99  --   BUN 19 20 15 21   CREATININE 1.02 1.08 0.95 1.0  CALCIUM 8.7 8.6 9.1  --    Liver Function Tests:  Recent Labs  02/19/14 1232 10/06/14 1635  AST 27 34  ALT 20 29  ALKPHOS 116 137*  BILITOT 0.3 0.6  PROT 7.3 7.7  ALBUMIN 3.1* 3.2*   No results for input(s): LIPASE, AMYLASE in the last 8760 hours. No  results for input(s): AMMONIA in the last 8760 hours. CBC:  Recent Labs  09/02/14 1136 09/08/14 1230 10/06/14 1635  WBC 6.4 10.4 7.8  NEUTROABS 4.3 7.6 5.0  HGB 14.6 14.0 13.2  HCT 43.0 42.4 40.2  MCV 97.7 98.6 98.0  PLT 173 182 232   Cardiac Enzymes:  Recent Labs  09/02/14 1136  10/06/14 2208 10/07/14 0430 10/07/14 0944  CKTOTAL 112  --   --   --   --   TROPONINI <0.30  < > <0.30 <0.30 <0.30  < > = values in this interval not displayed.  Imaging and Procedures: 02/10/15 Left hip xrays:  No acute bone abnormality to the pelvis or left hip.  02/10/15 CT  left hip w/o contrast:  Bipolar left hip prosthesis in expected position. No evidence of acute left hip fracture, dislocation, or other acute findings. Osteopenia and pubic symphysis degenerative changes.  Assessment/Plan 1. Contusion of left hip, subsequent encounter -seems she is mostly tender around her pelvis on the left/groin area, having difficulty with flexion of hip so shuffling when walking and when she has to move to her left -cont prn hydrocodone--only using 1-2 per day -also may use topical otc agents or heat for comfort -she will continue PT, OT at her ALF room with 24 hr caregivers present  2. Essential hypertension -bp at goal with current therapy--on lisinopril 10mg , pindolol 5mg  and lasix  3. Chronic diastolic heart failure -cont ace, lasix, no signs of acute exacerbation -should have bmp done at next routine visit  4. Atherosclerosis of native coronary artery of native heart without angina pectoris -will d/c celebrex due to this -has not been taking her zetia either so stop this -cont asa, lipitor, lisinopril  5. Gait disorder -cont to work with PT at home, using walker, will have 24 hr caregivers  6. Hypothyroidism, unspecified hypothyroidism type -cont synthroid 13mcg; no recent TSH has been abstracted but last one was normal: Lab Results  Component Value Date   TSH 4.310 05/24/2014   7. Gastroesophageal reflux disease, esophagitis presence not specified -stable on omeprazole  8. Glaucoma -cont simbrinza drops, f/u ophtho  9. Hyperglycemia -needs f/u hba1c done with next routine unless it just wasn't abstracted from her paper chart  Functional status: currently requiring 24 hr caregivers since fall with left hip contusion  Family/ staff Communication: pt was seen in presence of caregiver and therapy  Labs/tests ordered:  Will need cbc, cmp, tsh, hba1c, flp  (no labs in epic since October 2015)

## 2015-02-19 ENCOUNTER — Other Ambulatory Visit: Payer: Self-pay | Admitting: Internal Medicine

## 2015-02-19 ENCOUNTER — Encounter: Payer: Self-pay | Admitting: Internal Medicine

## 2015-02-20 ENCOUNTER — Other Ambulatory Visit: Payer: Self-pay | Admitting: Internal Medicine

## 2015-03-08 IMAGING — CR DG KNEE COMPLETE 4+V*R*
1 series · 1 of 1 positions shown · non-contrast
Comparison: None.

CLINICAL DATA: Fall.  Laceration of the anterior RIGHT knee.

EXAM:
RIGHT KNEE - COMPLETE 4+ VIEW

[view not recorded]
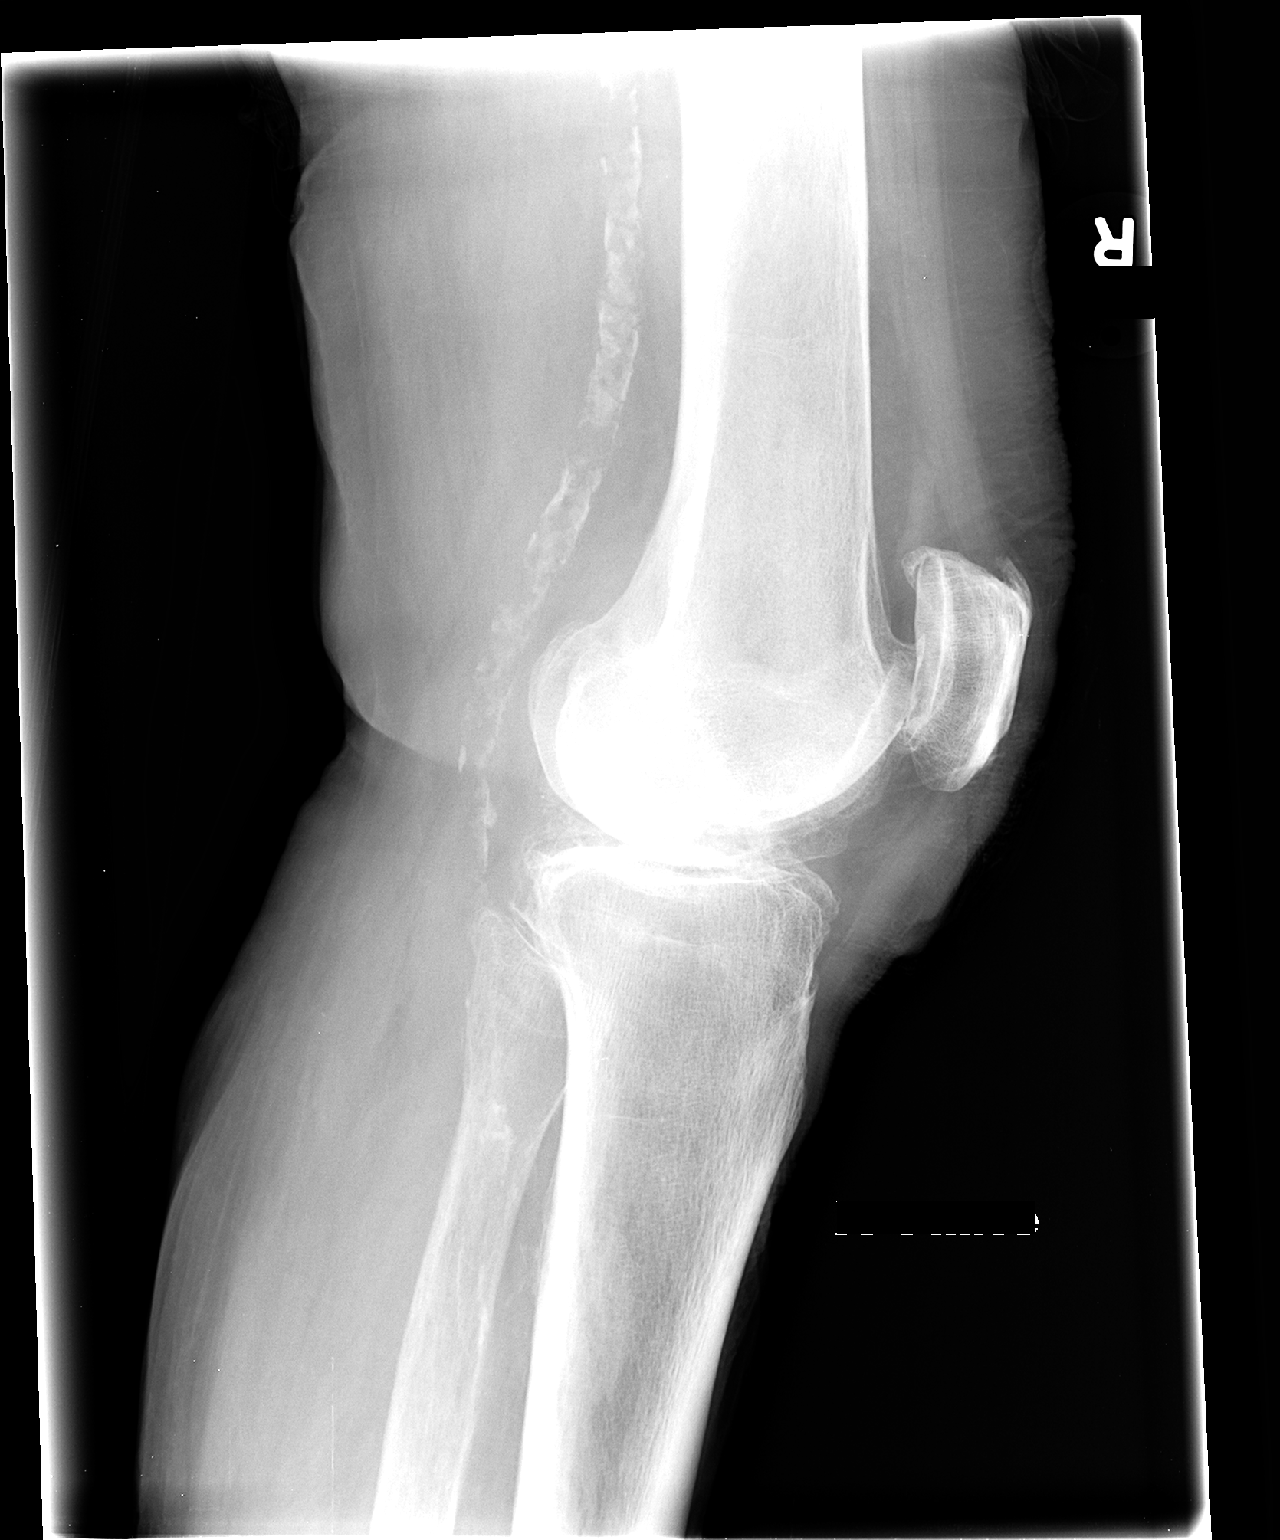

[1 of 1 positions shown; findings below may reference images not displayed]

FINDINGS: Severe tricompartmental osteoarthritis is present with
chondrocalcinosis of the menisci. Chondrocalcinosis is probably
senile and degenerative. Dense atherosclerosis is present. No
effusion. Mild soft tissue swelling anterior to the patellar tendon.
No gas is identified in the joint to suggest violation of the joint
capsule. No fracture.
IMPRESSION: No acute osseous abnormality. Mild soft tissue swelling anterior to
the patellar tendon.

## 2015-03-16 ENCOUNTER — Encounter: Payer: Self-pay | Admitting: Cardiology

## 2015-03-16 ENCOUNTER — Ambulatory Visit (INDEPENDENT_AMBULATORY_CARE_PROVIDER_SITE_OTHER): Payer: Medicare Other | Admitting: Cardiology

## 2015-03-16 VITALS — BP 132/60 | HR 54 | Ht 65.0 in | Wt 142.0 lb

## 2015-03-16 DIAGNOSIS — I471 Supraventricular tachycardia: Secondary | ICD-10-CM | POA: Diagnosis not present

## 2015-03-16 DIAGNOSIS — I2510 Atherosclerotic heart disease of native coronary artery without angina pectoris: Secondary | ICD-10-CM

## 2015-03-16 NOTE — Progress Notes (Signed)
HPI The patient has a history of supraventricular tachycardia. She has been seen by Dr. 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 However, since her bradycardia was only at night and she does not really want a pacemaker the decision was to observe only. Since I last saw her back to see Cecilie Kicks NP in our office. She's had her pindolol dose adjusted slightly. She's actually done well. Her husband who was 103 died in 2024-01-10. She had a fall and was seen in the emergency room in February. She's now at assisted living. She's getting some physical therapy. She's not having any palpitations, presyncope or syncope. She's not having any chest pressure, neck or arm discomfort. Her breathing is okay.Marland Kitchen  No Known Allergies  Current Outpatient Prescriptions  Medication Sig Dispense Refill  . aspirin EC 81 MG tablet Take 81 mg by mouth every evening.    Marland Kitchen atorvastatin (LIPITOR) 20 MG tablet TAKE 1 TABLET BY MOUTH DAILY TO LOWER LIPIDS 90 tablet 0  . Brinzolamide-Brimonidine (SIMBRINZA) 1-0.2 % SUSP Apply to eye. One drop in both eyes twice daily    . ezetimibe (ZETIA) 10 MG tablet Take 10 mg by mouth daily.    . furosemide (LASIX) 20 MG tablet Take 20 mg by mouth daily.    Marland Kitchen HYDROcodone-acetaminophen (NORCO) 5-325 MG per tablet Take 1 tablet by mouth every 6 (six) hours as needed for severe pain. 15 tablet 0  . imipramine (TOFRANIL) 50 MG tablet Take 50 mg by mouth at bedtime.    Marland Kitchen latanoprost (XALATAN) 0.005 % ophthalmic solution Place 1 drop into both eyes every evening. UAD    . levothyroxine (SYNTHROID, LEVOTHROID) 50 MCG tablet TAKE 1 TABLET BY MOUTH ONCE DAILY 90 tablet 0  . lisinopril (PRINIVIL,ZESTRIL) 10 MG tablet Take 1 tablet (10 mg total) by mouth 2 (two) times daily. 180 tablet 3  . Multiple Vitamin (MULTIVITAMIN WITH MINERALS) TABS Take 1 tablet by mouth daily. Certavite-A    . omeprazole (PRILOSEC) 20 MG capsule Take 40 mg by mouth daily.     . pindolol (VISKEN) 5 MG tablet Take 2.5  mg by mouth daily.     No current facility-administered medications for this visit.    Past Medical History  Diagnosis Date  . SVT (supraventricular tachycardia)     Diagnosed 2010  . HTN (hypertension)   . Dyslipidemia   . CAD (coronary artery disease)     a. NSTEMI 2010 in setting of SVT, with cath with unsuccessful PCI - chronic total occlusion of LAD, R->L collaterals.  . GI bleeding   . Unspecified hypothyroidism   . Unspecified hereditary and idiopathic peripheral neuropathy   . Pneumonia, organism unspecified   . Basal cell carcinoma of skin of trunk, except scrotum   . Unspecified malignant neoplasm of skin of lower limb, including hip   . Basal cell carcinoma of skin of lower limb, including hip   . Unspecified glaucoma     left eye  . Other premature beats   . Other esophagitis   . Esophageal reflux   . Osteoarthrosis, unspecified whether generalized or localized, unspecified site     multiple joints  . Pain in joint, shoulder region   . Pain in joint, lower leg     knee  . Pain in joint, ankle and foot     right  . Sacroiliitis, not elsewhere classified   . Cervicalgia   . Lumbago   . Ganglion of tendon sheath   . Pathologic fracture of  vertebrae   . Tietze's disease   . Insomnia, unspecified   . Disturbance of skin sensation   . Edema   . Palpitations   . Other nonspecific abnormal serum enzyme levels   . Nasal bones, closed fracture   . Closed fracture of unspecified trochanteric section of femur   . Open wound of knee, leg (except thigh), and ankle, without mention of complication   . Hip, thigh, leg, and ankle, abrasion or friction burn, without mention of infection   . Contusion of hip   . Fall from other slipping, tripping, or stumbling   . Personal history of fall   . Dyspepsia and other specified disorders of function of stomach   . Paroxysmal supraventricular tachycardia   . Acute bronchiolitis due to other infectious organisms 03/02/2013  .  Other abnormal blood chemistry 03/15/2013  . Contusion of rib on left side 08/03/2013  . Hypertension   . Coronary artery disease     ROS:  As stated in the HPI and negative for all other systems.  PHYSICAL EXAM BP 132/60 mmHg  Pulse 54  Ht 5\' 5"  (1.651 m)  Wt 142 lb (64.411 kg)  BMI 23.63 kg/m2 GEN:  No distress, frail appearing  NECK:  No jugular venous distention at 90 degrees, waveform within normal limits, carotid upstroke brisk and symmetric, no bruits, no thyromegaly LYMPHATICS:  No cervical adenopathy LUNGS:  Clear to auscultation bilaterally BACK:  No CVA tenderness CHEST:  Unremarkable HEART:  S1 and S2 within normal limits, no S3, no S4, no clicks, no rubs, soft apical systolic murmur, no diastolic murmurs ABD:  Positive bowel sounds normal in frequency in pitch, no bruits, no rebound, no guarding, unable to assess midline mass or bruit with the patient seated. EXT:  2 plus pulses throughout, moderate edema, no cyanosis no clubbing SKIN:  No rashes no nodules, bruising and wounds.     ASSESSMENT AND PLAN  PSVT -  She wants conservative therapy. She's not had any sustained dysrhythmias in a while. She's tolerating the meds as listed. No change in therapy is indicated.  HYPERTENSION -  The blood pressure is at target. No change in medications is indicated. We will continue with therapeutic lifestyle changes (TLC).  CHRONIC DIASTOLIC HF - She seems to be euvolemic.  No change in therapy is indicated.   CAD - I reviewed extensively her hospital records. She has had no active ischemia. No change in therapy is indicated other than above.  I will see her back as needed would be available for any urgent issues.

## 2015-03-16 NOTE — Patient Instructions (Signed)
Your physician recommends that you schedule a follow-up appointment in: as needed with Dr. Hochrein  

## 2015-03-21 ENCOUNTER — Telehealth: Payer: Self-pay | Admitting: Internal Medicine

## 2015-03-21 NOTE — Telephone Encounter (Signed)
Called patient daughter regarding her Flu Shot for 2015.  Left message to call back.. Carolin Coy

## 2015-03-22 ENCOUNTER — Non-Acute Institutional Stay: Payer: Medicare Other | Admitting: Nurse Practitioner

## 2015-03-22 ENCOUNTER — Encounter: Payer: Self-pay | Admitting: Nurse Practitioner

## 2015-03-22 VITALS — BP 130/62 | HR 60 | Temp 97.8°F | Wt 141.0 lb

## 2015-03-22 DIAGNOSIS — R234 Changes in skin texture: Secondary | ICD-10-CM | POA: Diagnosis not present

## 2015-03-22 DIAGNOSIS — E039 Hypothyroidism, unspecified: Secondary | ICD-10-CM | POA: Diagnosis not present

## 2015-03-22 DIAGNOSIS — R059 Cough, unspecified: Secondary | ICD-10-CM

## 2015-03-22 DIAGNOSIS — I10 Essential (primary) hypertension: Secondary | ICD-10-CM | POA: Diagnosis not present

## 2015-03-22 DIAGNOSIS — Z299 Encounter for prophylactic measures, unspecified: Secondary | ICD-10-CM

## 2015-03-22 DIAGNOSIS — R05 Cough: Secondary | ICD-10-CM | POA: Diagnosis not present

## 2015-03-22 DIAGNOSIS — Z418 Encounter for other procedures for purposes other than remedying health state: Secondary | ICD-10-CM | POA: Diagnosis not present

## 2015-03-22 NOTE — Progress Notes (Signed)
[name], female   DOB: September 03, 1920, 81 y.o.   MRN: [id number]    Nursing Home Location:  [place] of Service: Clinic (12)  PCP: REED, Jonelle Sidle, DO  No Known Allergies  Chief Complaint  Patient presents with  . Cough    for one week, coughing up phlegm    HPI:  Patient is a 81 y.o. female seen today at [facility] being seen today for cough.  She complains of a productive cough since 03/16/15.  Cough happens both day and night intermittently.  She has not taken anything for this.  She denies feeling bad or chills, fever and malaise.  No sore throat.  She does endorse some mild post nasal drip.  No shortness of breath, congestion. No LE edema noted.   Review of Systems:  Review of Systems  Constitutional: Negative for fever, chills and diaphoresis.  HENT: Positive for hearing loss and postnasal drip. Negative for congestion, rhinorrhea and sore throat.   Respiratory: Positive for cough. Negative for shortness of breath and wheezing.   Cardiovascular: Negative for chest pain, palpitations and leg swelling.       PSVT and syncopal episodes.  Gastrointestinal: Negative for nausea, diarrhea and constipation.  Musculoskeletal: Negative for myalgias, back pain and neck pain.  Skin: Negative for rash.  Neurological: Negative for tremors, seizures, weakness and headaches.  Hematological: Bruises/bleeds easily.    Past Medical History  Diagnosis Date  . SVT (supraventricular tachycardia)     Diagnosed 2010  . HTN (hypertension)   . Dyslipidemia   . CAD (coronary artery disease)     a. NSTEMI 2010 in setting of SVT, with cath with unsuccessful PCI - chronic total occlusion of LAD, R->L collaterals.  . GI bleeding   . Unspecified hypothyroidism   . Unspecified hereditary and idiopathic peripheral neuropathy   . Pneumonia, organism unspecified   . Basal cell carcinoma of skin of trunk, except scrotum   .  Unspecified malignant neoplasm of skin of lower limb, including hip   . Basal cell carcinoma of skin of lower limb, including hip   . Unspecified glaucoma     left eye  . Other premature beats   . Other esophagitis   . Esophageal reflux   . Osteoarthrosis, unspecified whether generalized or localized, unspecified site     multiple joints  . Pain in joint, shoulder region   . Pain in joint, lower leg     knee  . Pain in joint, ankle and foot     right  . Sacroiliitis, not elsewhere classified   . Cervicalgia   . Lumbago   . Ganglion of tendon sheath   . Pathologic fracture of vertebrae   . Tietze's disease   . Insomnia, unspecified   . Disturbance of skin sensation   . Edema   . Palpitations   . Other nonspecific abnormal serum enzyme levels   . Nasal bones, closed fracture   . Closed fracture of unspecified trochanteric section of femur   . Open wound of knee, leg (except thigh), and ankle, without mention of complication   . Hip, thigh, leg, and ankle, abrasion or friction burn, without mention of infection   . Contusion of hip   . Fall from other slipping, tripping, or stumbling   . Personal history of fall   . Dyspepsia and other specified disorders of function of stomach   . Paroxysmal supraventricular tachycardia   . Acute  bronchiolitis due to other infectious organisms 03/02/2013  . Other abnormal blood chemistry 03/15/2013  . Contusion of rib on left side 08/03/2013  . Hypertension   . Coronary artery disease    Past Surgical History  Procedure Laterality Date  . Total hip arthroplasty Left 2001  . Breast lumpectomy Right 1995     negative for malignancy  . Cataract extraction w/ intraocular lens  implant, bilateral Bilateral 2003  . Leg skin lesion  biopsy / excision Left 08/22/2010    Lower leg shave biopsy -solar keratosis (Dr. Radford Pax)  . Joint replacement    . Eye surgery     Social History:   reports that she has never smoked. She does not have any  smokeless tobacco history on file. She reports that she does not drink alcohol or use illicit drugs.  Family History  Problem Relation Age of Onset  . Stroke Neg Hx   . Heart attack Neg Hx   . Heart disease Father   . Cancer Brother     lung    Medications: Patient's Medications  New Prescriptions   No medications on file  Previous Medications   ASPIRIN EC 81 MG TABLET    Take 81 mg by mouth every evening.   ATORVASTATIN (LIPITOR) 20 MG TABLET    TAKE 1 TABLET BY MOUTH DAILY TO LOWER LIPIDS   BRINZOLAMIDE-BRIMONIDINE (SIMBRINZA) 1-0.2 % SUSP    Apply to eye. One drop in both eyes twice daily   EZETIMIBE (ZETIA) 10 MG TABLET    Take 10 mg by mouth daily.   FUROSEMIDE (LASIX) 20 MG TABLET    Take 20 mg by mouth daily.   HYDROCODONE-ACETAMINOPHEN (NORCO) 5-325 MG PER TABLET    Take 1 tablet by mouth every 6 (six) hours as needed for severe pain.   IMIPRAMINE (TOFRANIL) 50 MG TABLET    Take 50 mg by mouth at bedtime.   LATANOPROST (XALATAN) 0.005 % OPHTHALMIC SOLUTION    Place 1 drop into both eyes every evening. UAD   LEVOTHYROXINE (SYNTHROID, LEVOTHROID) 50 MCG TABLET    TAKE 1 TABLET BY MOUTH ONCE DAILY   LISINOPRIL (PRINIVIL,ZESTRIL) 10 MG TABLET    Take 1 tablet (10 mg total) by mouth 2 (two) times daily.   MULTIPLE VITAMIN (MULTIVITAMIN WITH MINERALS) TABS    Take 1 tablet by mouth daily. Certavite-A   OMEPRAZOLE (PRILOSEC) 20 MG CAPSULE    Take 40 mg by mouth daily.    PINDOLOL (VISKEN) 5 MG TABLET    Take 2.5 mg by mouth daily.  Modified Medications   No medications on file  Discontinued Medications   No medications on file     Physical Exam: Filed Vitals:   03/22/15 0902  BP: 130/62  Pulse: 60  Temp: 97.8 F (36.6 C)  TempSrc: Oral  Weight: 141 lb (63.957 kg)  SpO2: 91%    Physical Exam  Constitutional: She appears well-developed and well-nourished.  HENT:  Head: Normocephalic and atraumatic.  Right Ear: External ear normal.  Left Ear: External ear normal.    Mouth/Throat: Oropharynx is clear and moist. No oropharyngeal exudate.  Bilateral hearing loss  Eyes: Conjunctivae are normal. Pupils are equal, round, and reactive to light.  Neck: Normal range of motion. Neck supple. No JVD present.  Cardiovascular: Normal rate, regular rhythm and normal heart sounds.   Pulmonary/Chest: Effort normal and breath sounds normal.  Musculoskeletal: Normal range of motion. She exhibits no edema.  Unstable gait.  Neurological: She is  alert.  Memory loss.  Skin: Skin is warm and dry.  Has a 6-7 cm scab on the medial aspect to her left lower leg.  Appears stable.  No signs of infection.    Psychiatric: She has a normal mood and affect.    Labs reviewed: Basic Metabolic Panel:  Recent Labs  10/07/14 0430 10/08/14 0325 10/14/14 1138 10/27/14  NA 142 137 136 138  K 3.7 4.8 4.7 4.0  CL 103 102 102  --   CO2 25 24 26   --   GLUCOSE 80 87 99  --   BUN 19 20 15 21   CREATININE 1.02 1.08 0.95 1.0  CALCIUM 8.7 8.6 9.1  --    Liver Function Tests:  Recent Labs  10/06/14 1635  AST 34  ALT 29  ALKPHOS 137*  BILITOT 0.6  PROT 7.7  ALBUMIN 3.2*   No results for input(s): LIPASE, AMYLASE in the last 8760 hours. No results for input(s): AMMONIA in the last 8760 hours. CBC:  Recent Labs  09/02/14 1136 09/08/14 1230 10/06/14 1635  WBC 6.4 10.4 7.8  NEUTROABS 4.3 7.6 5.0  HGB 14.6 14.0 13.2  HCT 43.0 42.4 40.2  MCV 97.7 98.6 98.0  PLT 173 182 232   TSH:  Recent Labs  05/24/14 1003  TSH 4.310   A1C: Lab Results  Component Value Date   HGBA1C 5.7 12/21/2013   Lipid Panel:  Recent Labs  03/24/14  CHOL 129  HDL 29*  LDLCALC 69  TRIG 117    Assessment/Plan 1. Cough Overall feels well, no weight gain, or symptoms of CHF exerbation, Will Rx for Mucinex DM PO BID for 7 days and Claritin 10 mg PO daily for allergic rhinitis symptoms. Return precautions discussed and advised patient to notify if she starts feeling bad.    2. Scab on  left leg From old leg wound,  Continue to wash in whirlpool and apply mild scrubbing pressure to area. No intervention needed at this time  3. Hypertension Well controlled on current regimen,   Will obtain CMP and CBC since last drawn was October.   4. Hypothyroidism conts on synthroid 50 mcg, has been on meds.  Will obtain TSH.   Will follow up in 2 months.

## 2015-03-22 NOTE — Addendum Note (Signed)
Addended by: Lauree Chandler on: 03/22/2015 09:51 AM   Modules accepted: Level of Service

## 2015-03-23 LAB — CBC AND DIFFERENTIAL
HCT: 41 % (ref 36–46)
Hemoglobin: 14 g/dL (ref 12.0–16.0)
PLATELETS: 199 10*3/uL (ref 150–399)
WBC: 8.9 10^3/mL

## 2015-03-23 LAB — HEPATIC FUNCTION PANEL
ALT: 14 U/L (ref 7–35)
AST: 17 U/L (ref 13–35)
Alkaline Phosphatase: 157 U/L — AB (ref 25–125)
Bilirubin, Total: 0.4 mg/dL

## 2015-03-23 LAB — TSH: TSH: 1.56 u[IU]/mL (ref 0.41–5.90)

## 2015-03-23 LAB — BASIC METABOLIC PANEL
BUN: 24 mg/dL — AB (ref 4–21)
Creatinine: 0.9 mg/dL (ref 0.5–1.1)
GLUCOSE: 97 mg/dL
POTASSIUM: 4 mmol/L (ref 3.4–5.3)
SODIUM: 139 mmol/L (ref 137–147)

## 2015-04-07 ENCOUNTER — Encounter (INDEPENDENT_AMBULATORY_CARE_PROVIDER_SITE_OTHER): Payer: Medicare Other | Admitting: Ophthalmology

## 2015-04-07 DIAGNOSIS — H3531 Nonexudative age-related macular degeneration: Secondary | ICD-10-CM

## 2015-04-07 DIAGNOSIS — H43813 Vitreous degeneration, bilateral: Secondary | ICD-10-CM | POA: Diagnosis not present

## 2015-04-07 DIAGNOSIS — H44751 Retained (nonmagnetic) (old) foreign body in vitreous body, right eye: Secondary | ICD-10-CM | POA: Diagnosis not present

## 2015-04-07 DIAGNOSIS — H35033 Hypertensive retinopathy, bilateral: Secondary | ICD-10-CM | POA: Diagnosis not present

## 2015-04-07 DIAGNOSIS — I10 Essential (primary) hypertension: Secondary | ICD-10-CM

## 2015-04-07 IMAGING — CR DG CHEST 2V
2 series · 2 of 2 positions shown · non-contrast
Comparison: PA and lateral chest of July 14, 2014

CLINICAL DATA: Weakness and palpitations. ; history of MI ; patient
is unsure of the length of time of symptoms. ; history of
palpitations ; initial visit

EXAM:
CHEST  2 VIEW

[w chest pa]
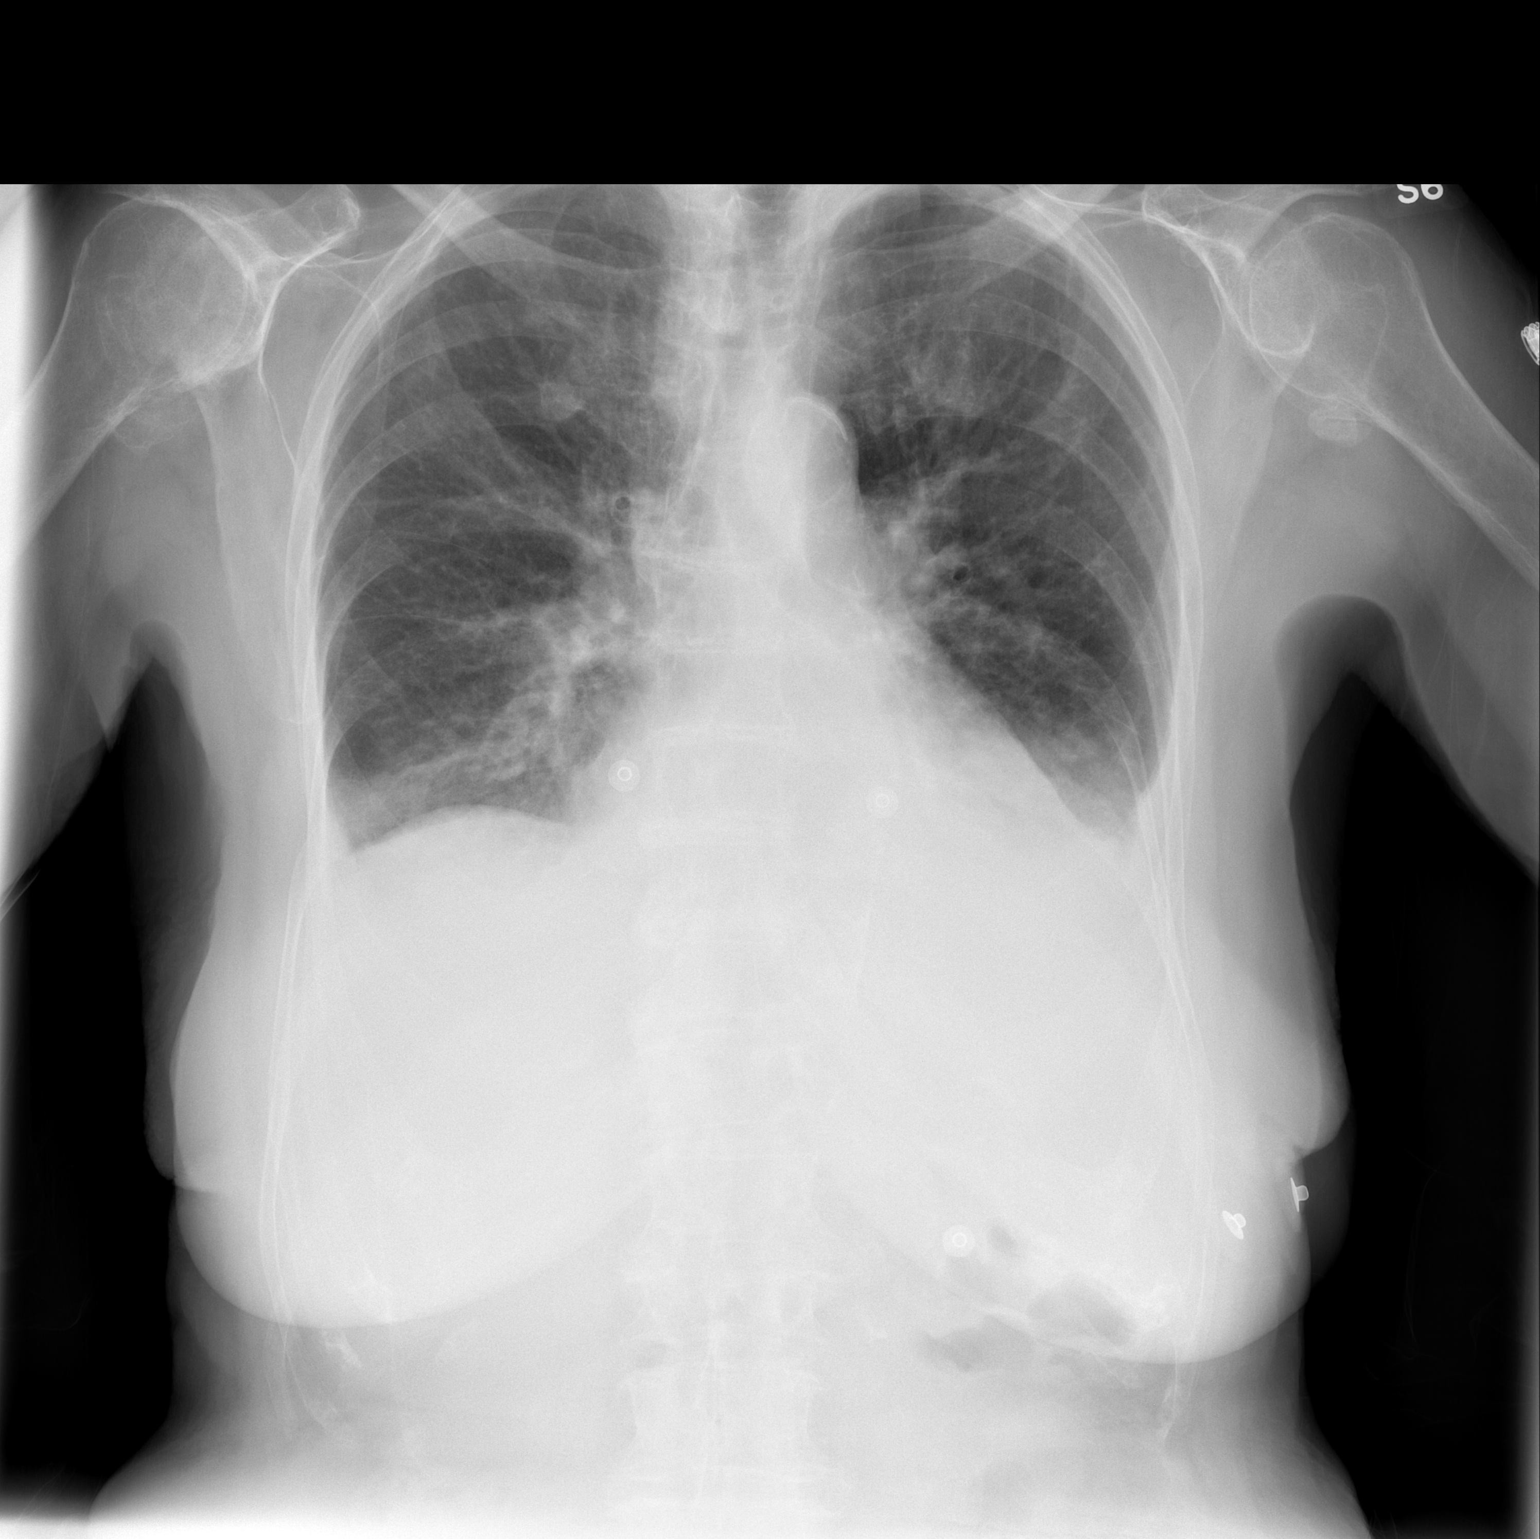

[w chest lat]
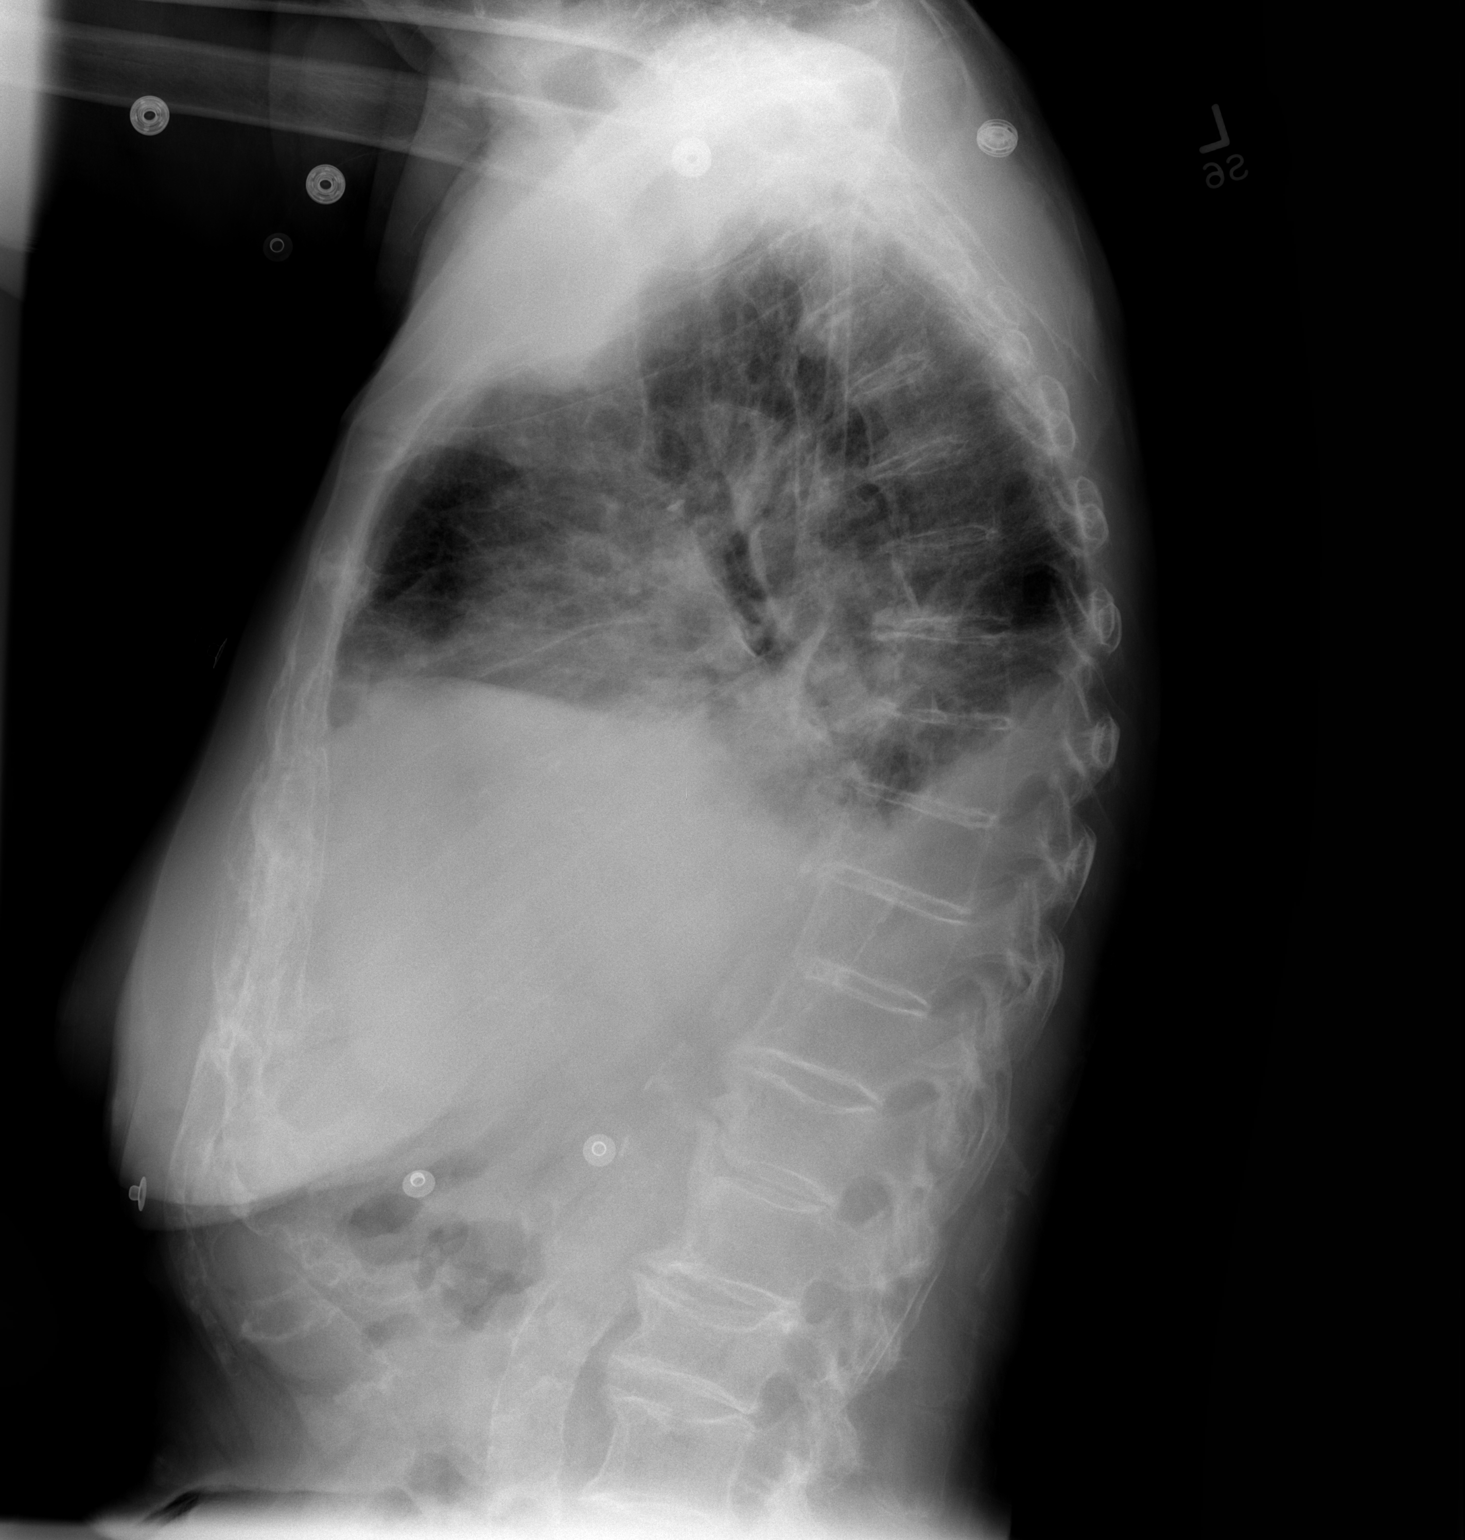

[2 of 2 positions shown; findings below may reference images not displayed]

FINDINGS: The lungs are mildly hypoinflated. There are new bilateral pleural
effusions. The pulmonary interstitial markings are increased. The
cardiopericardial silhouette is enlarged. The pulmonary vascularity
is engorged. The bony thorax is unremarkable.
IMPRESSION: Congestive heart failure with pulmonary interstitial edema and small
bilateral pleural effusions. These findings are new since the
previous study.

## 2015-04-07 NOTE — H&P (Signed)
[name] is an 81 y.o. female.   Chief Complaint:blurred and unstable vision right eye HPI: dislocated intraocular lens with movement in the visual axis right eye   Past Medical History  Diagnosis Date  . SVT (supraventricular tachycardia)     Diagnosed 2010  . HTN (hypertension)   . Dyslipidemia   . CAD (coronary artery disease)     a. NSTEMI 2010 in setting of SVT, with cath with unsuccessful PCI - chronic total occlusion of LAD, R->L collaterals.  . GI bleeding   . Unspecified hypothyroidism   . Unspecified hereditary and idiopathic peripheral neuropathy   . Pneumonia, organism unspecified   . Basal cell carcinoma of skin of trunk, except scrotum   . Unspecified malignant neoplasm of skin of lower limb, including hip   . Basal cell carcinoma of skin of lower limb, including hip   . Unspecified glaucoma     left eye  . Other premature beats   . Other esophagitis   . Esophageal reflux   . Osteoarthrosis, unspecified whether generalized or localized, unspecified site     multiple joints  . Pain in joint, shoulder region   . Pain in joint, lower leg     knee  . Pain in joint, ankle and foot     right  . Sacroiliitis, not elsewhere classified   . Cervicalgia   . Lumbago   . Ganglion of tendon sheath   . Pathologic fracture of vertebrae   . Tietze's disease   . Insomnia, unspecified   . Disturbance of skin sensation   . Edema   . Palpitations   . Other nonspecific abnormal serum enzyme levels   . Nasal bones, closed fracture   . Closed fracture of unspecified trochanteric section of femur   . Open wound of knee, leg (except thigh), and ankle, without mention of complication   . Hip, thigh, leg, and ankle, abrasion or friction burn, without mention of infection   . Contusion of hip   . Fall from other slipping, tripping, or stumbling   . Personal history of fall   . Dyspepsia and other specified disorders of function of stomach   . Paroxysmal supraventricular  tachycardia   . Acute bronchiolitis due to other infectious organisms 03/02/2013  . Other abnormal blood chemistry 03/15/2013  . Contusion of rib on left side 08/03/2013  . Hypertension   . Coronary artery disease     Past Surgical History  Procedure Laterality Date  . Total hip arthroplasty Left 2001  . Breast lumpectomy Right 1995     negative for malignancy  . Cataract extraction w/ intraocular lens  implant, bilateral Bilateral 2003  . Leg skin lesion  biopsy / excision Left 08/22/2010    Lower leg shave biopsy -solar keratosis (Dr. Radford Pax)  . Joint replacement    . Eye surgery      Family History  Problem Relation Age of Onset  . Stroke Neg Hx   . Heart attack Neg Hx   . Heart disease Father   . Cancer Brother     lung   Social History:  reports that she has never smoked. She does not have any smokeless tobacco history on file. She reports that she does not drink alcohol or use illicit drugs.  Allergies: No Known Allergies  No prescriptions prior to admission    Review of systems otherwise negative  There were no vitals taken for this visit.  Physical exam: Mental status: oriented x3. Eyes:  See eye exam associated with this date of surgery in media tab.  Scanned in by scanning center Ears, Nose, Throat: within normal limits Neck: Within Normal limits General: within normal limits Chest: Within normal limits Breast: deferred Heart: Within normal limits Abdomen: Within normal limits GU: deferred Extremities: within normal limits Skin: within normal limits  Assessment/Plan Dislocated intraocular lens (in the bag) right eye Plan: To [facility] for Pars plana vitrectomy, laser, removal of dislocated intraocular lens, placement of secondary intraocular lens with suture right eye  MATTHEWS, JOHN D 04/07/2015, 1:02 PM

## 2015-04-11 ENCOUNTER — Encounter: Payer: Self-pay | Admitting: Internal Medicine

## 2015-04-11 ENCOUNTER — Non-Acute Institutional Stay (INDEPENDENT_AMBULATORY_CARE_PROVIDER_SITE_OTHER): Payer: Medicare Other | Admitting: Internal Medicine

## 2015-04-11 VITALS — BP 122/62 | HR 60 | Temp 97.9°F | Wt 145.0 lb

## 2015-04-11 DIAGNOSIS — I5032 Chronic diastolic (congestive) heart failure: Secondary | ICD-10-CM

## 2015-04-11 DIAGNOSIS — R413 Other amnesia: Secondary | ICD-10-CM | POA: Diagnosis not present

## 2015-04-11 DIAGNOSIS — Z01818 Encounter for other preprocedural examination: Secondary | ICD-10-CM

## 2015-04-11 DIAGNOSIS — T8522XD Displacement of intraocular lens, subsequent encounter: Secondary | ICD-10-CM

## 2015-04-11 DIAGNOSIS — I2510 Atherosclerotic heart disease of native coronary artery without angina pectoris: Secondary | ICD-10-CM | POA: Diagnosis not present

## 2015-04-11 DIAGNOSIS — R269 Unspecified abnormalities of gait and mobility: Secondary | ICD-10-CM

## 2015-04-11 NOTE — Progress Notes (Signed)
[name], female   DOB: 1920/08/20, 81 y.o.   MRN: [id number]   Location:  [facility]  Code Status: DNR Goals of Care: Advanced Directive information Does patient have an advance directive?: Yes, Type of Advance Directive: [place];Out of facility DNR (pink MOST or yellow form), Pre-existing out of facility DNR order (yellow form or pink MOST form): Yellow form placed in chart (order not valid for inpatient use), Does patient want to make changes to advanced directive?: No - Patient declined HCPOA is scanned in media  No Known Allergies  Chief Complaint  Patient presents with  . Medical Management of Chronic Issues    surgery clearance. 04/25/15 Dr. Zigmund Daniel on right eye    HPI: Patient is a 81 y.o. white female long term resident of [facility] seen in the clinic today for medical clearance for surgery on her right eye 04/25/15 due to dislocated intraocular lens (in the bag).  She had been living in independent living until Feb when she had a fall with hip contusion.  In reference to perioperative mgt:  Her aspirin is to be held from 04/20/15-04/25/15.  An EKG was performed here today due to her CAD, diastolic CHF and shows sinus rhythm with LBBB at 54bpm unchanged from 10/15.  She is doing well.  Her caregiver mentions that when she gets anxious, her blood pressure and pulse sometimes go up, but then return to normal when she calms down.  Her initial bp today was 101 systolic, but improved to 120s upon reassessment.  Pt herself does have dementia with short term memory loss so she does not recall these episodes.  She denies any concerns whatsoever.  She feels well.  She is able to bathe, dress and groom herself and use the toilet with close monitoring.  She ambulates with a walker.  Review of Systems:  Review of Systems  Constitutional: Negative for fever and chills.  HENT: Negative for congestion.   Respiratory: Negative for cough and  shortness of breath.   Cardiovascular: Negative for chest pain, palpitations, orthopnea, leg swelling and PND.  Gastrointestinal: Negative for abdominal pain and constipation.  Genitourinary: Negative for dysuria.  Musculoskeletal: Positive for joint pain.  Skin: Negative for rash.  Neurological: Positive for weakness. Negative for dizziness and loss of consciousness.  Psychiatric/Behavioral: Positive for memory loss. Negative for depression. The patient is nervous/anxious.      Past Medical History  Diagnosis Date  . SVT (supraventricular tachycardia)     Diagnosed 2010  . HTN (hypertension)   . Dyslipidemia   . CAD (coronary artery disease)     a. NSTEMI 2010 in setting of SVT, with cath with unsuccessful PCI - chronic total occlusion of LAD, R->L collaterals.  . GI bleeding   . Unspecified hypothyroidism   . Unspecified hereditary and idiopathic peripheral neuropathy   . Pneumonia, organism unspecified   . Basal cell carcinoma of skin of trunk, except scrotum   . Unspecified malignant neoplasm of skin of lower limb, including hip   . Basal cell carcinoma of skin of lower limb, including hip   . Unspecified glaucoma     left eye  . Other premature beats   . Other esophagitis   . Esophageal reflux   . Osteoarthrosis, unspecified whether generalized or localized, unspecified site     multiple joints  . Pain in joint, shoulder region   . Pain in joint, lower leg     knee  .  Pain in joint, ankle and foot     right  . Sacroiliitis, not elsewhere classified   . Cervicalgia   . Lumbago   . Ganglion of tendon sheath   . Pathologic fracture of vertebrae   . Tietze's disease   . Insomnia, unspecified   . Disturbance of skin sensation   . Edema   . Palpitations   . Other nonspecific abnormal serum enzyme levels   . Nasal bones, closed fracture   . Closed fracture of unspecified trochanteric section of femur   . Open wound of knee, leg (except thigh), and ankle, without  mention of complication   . Hip, thigh, leg, and ankle, abrasion or friction burn, without mention of infection   . Contusion of hip   . Fall from other slipping, tripping, or stumbling   . Personal history of fall   . Dyspepsia and other specified disorders of function of stomach   . Paroxysmal supraventricular tachycardia   . Acute bronchiolitis due to other infectious organisms 03/02/2013  . Other abnormal blood chemistry 03/15/2013  . Contusion of rib on left side 08/03/2013  . Hypertension   . Coronary artery disease     Past Surgical History  Procedure Laterality Date  . Total hip arthroplasty Left 2001  . Breast lumpectomy Right 1995     negative for malignancy  . Cataract extraction w/ intraocular lens  implant, bilateral Bilateral 2003  . Leg skin lesion  biopsy / excision Left 08/22/2010    Lower leg shave biopsy -solar keratosis (Dr. Radford Pax)  . Joint replacement    . Eye surgery      Social History:   reports that she has never smoked. She does not have any smokeless tobacco history on file. She reports that she does not drink alcohol or use illicit drugs.  Family History  Problem Relation Age of Onset  . Stroke Neg Hx   . Heart attack Neg Hx   . Heart disease Father   . Cancer Brother     lung    Medications: Patient's Medications  New Prescriptions   No medications on file  Previous Medications   ACETAMINOPHEN (TYLENOL) 500 MG TABLET    Take 500 mg by mouth. Take two tablets twice a day   AMOXICILLIN (AMOXIL) 500 MG TABLET    Take 500 mg by mouth. Take 4 capsules one hour prior to dental appointment   ASPIRIN EC 81 MG TABLET    Take 81 mg by mouth every evening.   ATORVASTATIN (LIPITOR) 20 MG TABLET    TAKE 1 TABLET BY MOUTH DAILY TO LOWER LIPIDS   BRINZOLAMIDE-BRIMONIDINE (SIMBRINZA) 1-0.2 % SUSP    Apply to eye. One drop in both eyes twice daily   EZETIMIBE (ZETIA) 10 MG TABLET    Take 10 mg by mouth daily.   FUROSEMIDE (LASIX) 20 MG TABLET    Take 20 mg  by mouth daily.   HYDROCODONE-ACETAMINOPHEN (NORCO) 5-325 MG PER TABLET    Take 1 tablet by mouth every 6 (six) hours as needed for severe pain.   IMIPRAMINE (TOFRANIL) 50 MG TABLET    Take 50 mg by mouth at bedtime.   LATANOPROST (XALATAN) 0.005 % OPHTHALMIC SOLUTION    Place 1 drop into both eyes every evening. UAD   LEVOTHYROXINE (SYNTHROID, LEVOTHROID) 50 MCG TABLET    TAKE 1 TABLET BY MOUTH ONCE DAILY   LISINOPRIL (PRINIVIL,ZESTRIL) 10 MG TABLET    Take 1 tablet (10 mg total) by mouth  2 (two) times daily.   LORATADINE (CLARITIN) 10 MG TABLET    Take 10 mg by mouth daily.   MELATONIN 5 MG TABS    Take by mouth. Take one tablet at bedtime as needed for insomnia   MULTIPLE VITAMIN (MULTIVITAMIN WITH MINERALS) TABS    Take 1 tablet by mouth daily. Certavite-A   MULTIPLE VITAMINS-MINERALS (PRESERVISION/LUTEIN) CAPS    Take by mouth. Take one twice daily for eyes   OMEPRAZOLE (PRILOSEC) 20 MG CAPSULE    Take 40 mg by mouth daily.    PINDOLOL (VISKEN) 5 MG TABLET    Take 2.5 mg by mouth daily.   SALINE NASAL SPRAY NA    Place into the nose. One spray nasal every 2 hours up to 24 hours as needed  Modified Medications   No medications on file  Discontinued Medications   DEXTROMETHORPHAN-GUAIFENESIN 10-100 MG/5ML LIQUID    Take 5 mLs by mouth. 10cc every 4 hours as needed up to two days     Physical Exam: Filed Vitals:   04/11/15 1341 04/11/15 1414  BP: 150/64 122/62  Pulse: 60   Temp: 97.9 F (36.6 C)   TempSrc: Oral   Weight: 145 lb (65.772 kg)   SpO2: 91%   Physical Exam  Constitutional:  Thin white female  Eyes:  Wears glasses  Cardiovascular: Normal rate, regular rhythm, normal heart sounds and intact distal pulses.   Pulmonary/Chest: Effort normal and breath sounds normal. No respiratory distress. She has no wheezes. She has no rales.  Abdominal: Bowel sounds are normal.  Musculoskeletal: Normal range of motion.  Uses walker to ambulate  Neurological: She is alert.  Skin:  Skin is warm and dry.  Psychiatric: She has a normal mood and affect.    Labs reviewed: Basic Metabolic Panel:  Recent Labs  05/24/14 1003  10/07/14 0430 10/08/14 0325 10/14/14 1138 10/27/14 03/23/15  NA 141  < > 142 137 136 138 139  K 4.5  < > 3.7 4.8 4.7 4.0 4.0  CL 105  < > 103 102 102  --   --   CO2 27  < > 25 24 26   --   --   GLUCOSE 127*  < > 80 87 99  --   --   BUN 25*  < > 19 20 15 21  24*  CREATININE 1.23*  < > 1.02 1.08 0.95 1.0 0.9  CALCIUM 9.1  < > 8.7 8.6 9.1  --   --   TSH 4.310  --   --   --   --   --  1.56  < > = values in this interval not displayed. Liver Function Tests:  Recent Labs  10/06/14 1635 03/23/15  AST 34 17  ALT 29 14  ALKPHOS 137* 157*  BILITOT 0.6  --   PROT 7.7  --   ALBUMIN 3.2*  --    No results for input(s): LIPASE, AMYLASE in the last 8760 hours. No results for input(s): AMMONIA in the last 8760 hours. CBC:  Recent Labs  09/02/14 1136 09/08/14 1230 10/06/14 1635 03/23/15  WBC 6.4 10.4 7.8 8.9  NEUTROABS 4.3 7.6 5.0  --   HGB 14.6 14.0 13.2 14.0  HCT 43.0 42.4 40.2 41  MCV 97.7 98.6 98.0  --   PLT 173 182 232 199   Lipid Panel: No results for input(s): CHOL, HDL, LDLCALC, TRIG, CHOLHDL, LDLDIRECT in the last 8760 hours. Lab Results  Component Value Date   HGBA1C  5.7 12/21/2013  EKG to be scanned Most recent labs were abstracted from 03/23/15--wnl.    Assessment/Plan 1. Preop exam for internal medicine -pt is at moderate risk given her CAD and CHF history, but has been stable from this perspective and EKG today unchanged  2. Chronic diastolic heart failure -cont current regimen w/o changes, monitor weights in context of surgery if fluids are given  3. Atherosclerosis of native coronary artery of native heart without angina pectoris -cont current regimen except to hold aspirin for 5 days preop  4. Gait disorder -cont use of walker and caregiver presence to prevent falls  5. Memory loss -unable to manage her own  meds or keep track of appts, but still able to do ADLs with supervision -early dementia--discuss meds in near future  6. Dislocated IOL (intraocular lens), subsequent encounter -for surgery 4/26 by Dr. Horton Marshall send copy of this note, EKG to his office along with the form they have asked [place] to complete for surgical clearance  Labs/tests ordered:  EKG done today; had recent labs Next appt:  Keep appt with Janett Billow in May  Kenyotta Dorfman L. Humzah Harty, D.O. [facility] [address], [place] [address] Cell Phone (Mon-Fri 8am-5pm):  [phone] On Call:  [phone] & follow prompts after 5pm & weekends Office Phone:  [phone] Office Fax:  [phone]

## 2015-04-13 ENCOUNTER — Telehealth: Payer: Self-pay | Admitting: Cardiology

## 2015-04-13 NOTE — Telephone Encounter (Signed)
Left message to call back  

## 2015-04-13 NOTE — Telephone Encounter (Signed)
Returned call to patient's daughter [name] no answer.[place].

## 2015-04-13 NOTE — Telephone Encounter (Signed)
New Message  Pt daughter calling to speak w/ RN to see if pt should start on Heart healthy diet. Please call back and discuss.

## 2015-04-13 NOTE — Telephone Encounter (Signed)
Pt's daughter is returning [name]'s call about clearance for eye surgery  thanks

## 2015-04-17 NOTE — Telephone Encounter (Signed)
Daughter calling back again about clarence for eye surgery.

## 2015-04-17 NOTE — Telephone Encounter (Signed)
Spoke with [name] - daughter. Informed her clearance form was located and Dr. Percival Spanish cleared patient for retina surgery under general anesthesia - OK to hold aspirin 5 days prior to surgery.   Form faxed to Dr. [name] @ [facility] [phone]

## 2015-04-18 ENCOUNTER — Encounter (HOSPITAL_COMMUNITY)
Admission: RE | Admit: 2015-04-18 | Discharge: 2015-04-18 | Disposition: A | Discharge status: Home / Self Care | Payer: Medicare Other | Source: Ambulatory Visit | Attending: Ophthalmology | Admitting: Ophthalmology

## 2015-04-18 ENCOUNTER — Encounter (HOSPITAL_COMMUNITY): Payer: Self-pay

## 2015-04-18 DIAGNOSIS — Z01812 Encounter for preprocedural laboratory examination: Secondary | ICD-10-CM | POA: Diagnosis not present

## 2015-04-18 HISTORY — DX: Unspecified glaucoma: H40.9

## 2015-04-18 LAB — CBC
HCT: 39.4 % (ref 36.0–46.0)
HEMOGLOBIN: 12.4 g/dL (ref 12.0–15.0)
MCH: 32.5 pg (ref 26.0–34.0)
MCHC: 31.5 g/dL (ref 30.0–36.0)
MCV: 103.4 fL — ABNORMAL HIGH (ref 78.0–100.0)
Platelets: 197 10*3/uL (ref 150–400)
RBC: 3.81 MIL/uL — ABNORMAL LOW (ref 3.87–5.11)
RDW: 13.6 % (ref 11.5–15.5)
WBC: 7.3 10*3/uL (ref 4.0–10.5)

## 2015-04-18 LAB — BASIC METABOLIC PANEL
ANION GAP: 6 (ref 5–15)
BUN: 24 mg/dL — ABNORMAL HIGH (ref 6–23)
CHLORIDE: 106 mmol/L (ref 96–112)
CO2: 27 mmol/L (ref 19–32)
Calcium: 8.7 mg/dL (ref 8.4–10.5)
Creatinine, Ser: 1 mg/dL (ref 0.50–1.10)
GFR calc non Af Amer: 47 mL/min — ABNORMAL LOW (ref >90)
GFR, EST AFRICAN AMERICAN: 55 mL/min — AB (ref >90)
Glucose, Bld: 126 mg/dL — ABNORMAL HIGH (ref 70–99)
Potassium: 4.1 mmol/L (ref 3.5–5.1)
SODIUM: 139 mmol/L (ref 135–145)

## 2015-04-18 NOTE — Pre-Procedure Instructions (Addendum)
[name]  04/18/2015   Your procedure is scheduled on:  Tuesday, April 26.  Report to [facility] Admitting at the time that Dr Zigmund Daniel instructed you..   Call this number if you have problems the morning of surgery: [phone]               For any other questions, please call [phone], Monday - Friday 8 AM - 4 PM.  Remember   Do not eat food or drink liquids after midnight Monday, April 25.   Take these medicines the morning of surgery with A SIP OF WATER: As instructed by Dr Zigmund Daniel.   Do not wear jewelry, make-up or nail polish.  Do not wear lotions, powders, or perfumes.   Do not shave 48 hours prior to surgery.   Do not bring valuables to the hospital.             [facility] is not responsible for any belongings or valuables.               Contacts, dentures or bridgework may not be worn into surgery.  Leave suitcase in the car. After surgery it may be brought to your room.  For patients admitted to the hospital, discharge time is determined by your treatment team.               Patients discharged the day of surgery will not be allowed to drive home.  Name and phone number of your driver: -   Special Instructions: Review  [place] - Preparing For Surgery.   Please read over the following fact sheets that you were given: Pain Booklet, Coughing and Deep Breathing and Surgical Site Infection Prevention

## 2015-04-19 NOTE — Addendum Note (Signed)
Addended by: Ripley Fraise on: 04/19/2015 11:25 AM   Modules accepted: Orders

## 2015-04-24 MED ORDER — GATIFLOXACIN 0.5 % OP SOLN
1.0000 [drp] | OPHTHALMIC | Status: AC | PRN
  Administered 2015-04-25 (×3): 1 [drp] via OPHTHALMIC
  Filled 2015-04-24: qty 2.5

## 2015-04-24 MED ORDER — CEFAZOLIN SODIUM-DEXTROSE 2-3 GM-% IV SOLR
2.0000 g | INTRAVENOUS | Status: AC
  Administered 2015-04-25: 2 g via INTRAVENOUS
  Filled 2015-04-24: qty 50

## 2015-04-24 MED ORDER — PHENYLEPHRINE HCL 2.5 % OP SOLN
1.0000 [drp] | OPHTHALMIC | Status: AC | PRN
  Administered 2015-04-25 (×3): 1 [drp] via OPHTHALMIC
  Filled 2015-04-24: qty 2

## 2015-04-24 MED ORDER — TROPICAMIDE 1 % OP SOLN
1.0000 [drp] | OPHTHALMIC | Status: AC | PRN
  Administered 2015-04-25 (×3): 1 [drp] via OPHTHALMIC
  Filled 2015-04-24: qty 3

## 2015-04-24 MED ORDER — CYCLOPENTOLATE HCL 1 % OP SOLN
1.0000 [drp] | OPHTHALMIC | Status: AC | PRN
  Administered 2015-04-25 (×3): 1 [drp] via OPHTHALMIC
  Filled 2015-04-24: qty 2

## 2015-04-25 ENCOUNTER — Observation Stay (HOSPITAL_COMMUNITY)
Admission: RE | Admit: 2015-04-25 | Discharge: 2015-04-26 | Disposition: A | Discharge status: Home / Self Care | Payer: Medicare Other | Source: Ambulatory Visit | Attending: Ophthalmology | Admitting: Ophthalmology

## 2015-04-25 ENCOUNTER — Encounter (HOSPITAL_COMMUNITY)
Admission: RE | Disposition: A | Discharge status: Home / Self Care | Payer: Self-pay | Source: Ambulatory Visit | Attending: Ophthalmology

## 2015-04-25 ENCOUNTER — Encounter (HOSPITAL_COMMUNITY): Payer: Self-pay | Admitting: *Deleted

## 2015-04-25 ENCOUNTER — Ambulatory Visit (HOSPITAL_COMMUNITY): Payer: Medicare Other | Admitting: Anesthesiology

## 2015-04-25 DIAGNOSIS — I252 Old myocardial infarction: Secondary | ICD-10-CM | POA: Insufficient documentation

## 2015-04-25 DIAGNOSIS — I509 Heart failure, unspecified: Secondary | ICD-10-CM | POA: Insufficient documentation

## 2015-04-25 DIAGNOSIS — T8529XA Other mechanical complication of intraocular lens, initial encounter: Secondary | ICD-10-CM

## 2015-04-25 DIAGNOSIS — I10 Essential (primary) hypertension: Secondary | ICD-10-CM | POA: Insufficient documentation

## 2015-04-25 DIAGNOSIS — I2510 Atherosclerotic heart disease of native coronary artery without angina pectoris: Secondary | ICD-10-CM | POA: Insufficient documentation

## 2015-04-25 DIAGNOSIS — E785 Hyperlipidemia, unspecified: Secondary | ICD-10-CM | POA: Insufficient documentation

## 2015-04-25 DIAGNOSIS — Z85828 Personal history of other malignant neoplasm of skin: Secondary | ICD-10-CM | POA: Diagnosis not present

## 2015-04-25 DIAGNOSIS — Y831 Surgical operation with implant of artificial internal device as the cause of abnormal reaction of the patient, or of later complication, without mention of misadventure at the time of the procedure: Secondary | ICD-10-CM | POA: Diagnosis not present

## 2015-04-25 DIAGNOSIS — K219 Gastro-esophageal reflux disease without esophagitis: Secondary | ICD-10-CM | POA: Diagnosis not present

## 2015-04-25 DIAGNOSIS — J449 Chronic obstructive pulmonary disease, unspecified: Secondary | ICD-10-CM | POA: Diagnosis not present

## 2015-04-25 DIAGNOSIS — I739 Peripheral vascular disease, unspecified: Secondary | ICD-10-CM | POA: Diagnosis not present

## 2015-04-25 DIAGNOSIS — T8522XA Displacement of intraocular lens, initial encounter: Principal | ICD-10-CM | POA: Diagnosis present

## 2015-04-25 DIAGNOSIS — E039 Hypothyroidism, unspecified: Secondary | ICD-10-CM | POA: Diagnosis not present

## 2015-04-25 DIAGNOSIS — Z96642 Presence of left artificial hip joint: Secondary | ICD-10-CM | POA: Insufficient documentation

## 2015-04-25 DIAGNOSIS — H27139 Posterior dislocation of lens, unspecified eye: Secondary | ICD-10-CM | POA: Diagnosis present

## 2015-04-25 HISTORY — PX: PARS PLANA VITRECTOMY: SHX2166

## 2015-04-25 SURGERY — PARS PLANA VITRECTOMY WITH 25G REMOVAL/SUTURE INTRAOCULAR LENS
Anesthesia: General | Site: Eye | Laterality: Right

## 2015-04-25 MED ORDER — TEMAZEPAM 15 MG PO CAPS: 15.0000 mg | ORAL_CAPSULE | Freq: Every evening | ORAL | Status: DC | PRN

## 2015-04-25 MED ORDER — ROCURONIUM BROMIDE 100 MG/10ML IV SOLN
INTRAVENOUS | Status: DC | PRN
  Administered 2015-04-25: 25 mg via INTRAVENOUS

## 2015-04-25 MED ORDER — BUPIVACAINE HCL (PF) 0.75 % IJ SOLN
INTRAMUSCULAR | Status: AC
  Filled 2015-04-25: qty 10

## 2015-04-25 MED ORDER — ATROPINE SULFATE 1 % OP SOLN
OPHTHALMIC | Status: AC
  Filled 2015-04-25: qty 5

## 2015-04-25 MED ORDER — HYDRALAZINE HCL 20 MG/ML IJ SOLN
INTRAMUSCULAR | Status: AC
  Filled 2015-04-25: qty 1

## 2015-04-25 MED ORDER — BACITRACIN-POLYMYXIN B 500-10000 UNIT/GM OP OINT
1.0000 "application " | TOPICAL_OINTMENT | Freq: Three times a day (TID) | OPHTHALMIC | Status: DC
  Administered 2015-04-26: 1 via OPHTHALMIC
  Filled 2015-04-25: qty 3.5

## 2015-04-25 MED ORDER — FUROSEMIDE 20 MG PO TABS
20.0000 mg | ORAL_TABLET | Freq: Every day | ORAL | Status: DC
  Administered 2015-04-25 – 2015-04-26 (×2): 20 mg via ORAL
  Filled 2015-04-25 (×2): qty 1

## 2015-04-25 MED ORDER — DEXAMETHASONE SODIUM PHOSPHATE 10 MG/ML IJ SOLN
INTRAMUSCULAR | Status: AC
  Filled 2015-04-25: qty 1

## 2015-04-25 MED ORDER — HYDROCODONE-ACETAMINOPHEN 5-325 MG PO TABS
1.0000 | ORAL_TABLET | Freq: Four times a day (QID) | ORAL | Status: DC | PRN
  Filled 2015-04-25: qty 1

## 2015-04-25 MED ORDER — SODIUM HYALURONATE 10 MG/ML IO SOLN
INTRAOCULAR | Status: DC | PRN
  Administered 2015-04-25: 0.85 mL via INTRAOCULAR

## 2015-04-25 MED ORDER — FENTANYL CITRATE (PF) 100 MCG/2ML IJ SOLN
INTRAMUSCULAR | Status: AC
  Filled 2015-04-25: qty 2

## 2015-04-25 MED ORDER — HYDROCODONE-ACETAMINOPHEN 5-325 MG PO TABS
ORAL_TABLET | ORAL | Status: AC
  Filled 2015-04-25: qty 2

## 2015-04-25 MED ORDER — SODIUM CHLORIDE 0.9 % IJ SOLN
INTRAMUSCULAR | Status: DC | PRN
  Administered 2015-04-25: 10 mL

## 2015-04-25 MED ORDER — FENTANYL CITRATE (PF) 100 MCG/2ML IJ SOLN
25.0000 ug | INTRAMUSCULAR | Status: DC | PRN
  Administered 2015-04-25 (×4): 25 ug via INTRAVENOUS
  Administered 2015-04-25: 50 ug via INTRAVENOUS

## 2015-04-25 MED ORDER — KETOROLAC TROMETHAMINE 15 MG/ML IJ SOLN
15.0000 mg | Freq: Once | INTRAMUSCULAR | Status: AC
  Administered 2015-04-25: 15 mg via INTRAVENOUS

## 2015-04-25 MED ORDER — ACETAMINOPHEN 325 MG PO TABS: 325.0000 mg | ORAL_TABLET | ORAL | Status: DC | PRN

## 2015-04-25 MED ORDER — 0.9 % SODIUM CHLORIDE (POUR BTL) OPTIME
TOPICAL | Status: DC | PRN
  Administered 2015-04-25: 200 mL

## 2015-04-25 MED ORDER — EZETIMIBE 10 MG PO TABS
10.0000 mg | ORAL_TABLET | Freq: Every day | ORAL | Status: DC
  Administered 2015-04-25 – 2015-04-26 (×2): 10 mg via ORAL
  Filled 2015-04-25 (×2): qty 1

## 2015-04-25 MED ORDER — POLYMYXIN B SULFATE 500000 UNITS IJ SOLR
INTRAMUSCULAR | Status: AC
  Filled 2015-04-25: qty 1

## 2015-04-25 MED ORDER — EPHEDRINE SULFATE 50 MG/ML IJ SOLN
INTRAMUSCULAR | Status: DC | PRN
  Administered 2015-04-25 (×3): 5 mg via INTRAVENOUS

## 2015-04-25 MED ORDER — HYPROMELLOSE (GONIOSCOPIC) 2.5 % OP SOLN
OPHTHALMIC | Status: AC
  Filled 2015-04-25: qty 15

## 2015-04-25 MED ORDER — LISINOPRIL 10 MG PO TABS
10.0000 mg | ORAL_TABLET | Freq: Two times a day (BID) | ORAL | Status: DC
  Administered 2015-04-25 – 2015-04-26 (×2): 10 mg via ORAL
  Filled 2015-04-25 (×2): qty 2

## 2015-04-25 MED ORDER — ACETAZOLAMIDE SODIUM 500 MG IJ SOLR
500.0000 mg | Freq: Once | INTRAMUSCULAR | Status: AC
  Administered 2015-04-26: 500 mg via INTRAVENOUS
  Filled 2015-04-25: qty 500

## 2015-04-25 MED ORDER — BUPIVACAINE HCL (PF) 0.75 % IJ SOLN
INTRAMUSCULAR | Status: DC | PRN
  Administered 2015-04-25: 10 mL

## 2015-04-25 MED ORDER — GATIFLOXACIN 0.5 % OP SOLN
1.0000 [drp] | Freq: Four times a day (QID) | OPHTHALMIC | Status: DC
  Administered 2015-04-26: 1 [drp] via OPHTHALMIC
  Filled 2015-04-25: qty 2.5

## 2015-04-25 MED ORDER — ONDANSETRON HCL 4 MG/2ML IJ SOLN
INTRAMUSCULAR | Status: DC | PRN
  Administered 2015-04-25: 4 mg via INTRAVENOUS

## 2015-04-25 MED ORDER — LORATADINE 10 MG PO TABS
10.0000 mg | ORAL_TABLET | Freq: Every day | ORAL | Status: DC
  Administered 2015-04-25 – 2015-04-26 (×2): 10 mg via ORAL
  Filled 2015-04-25 (×2): qty 1

## 2015-04-25 MED ORDER — HEMOSTATIC AGENTS (NO CHARGE) OPTIME
TOPICAL | Status: DC | PRN
  Administered 2015-04-25: 1 via TOPICAL

## 2015-04-25 MED ORDER — ACETYLCHOLINE CHLORIDE 20 MG IO SOLR
INTRAOCULAR | Status: AC
  Filled 2015-04-25: qty 1

## 2015-04-25 MED ORDER — ATORVASTATIN CALCIUM 10 MG PO TABS: 10.0000 mg | ORAL_TABLET | Freq: Every day | ORAL | Status: DC

## 2015-04-25 MED ORDER — GLYCOPYRROLATE 0.2 MG/ML IJ SOLN
INTRAMUSCULAR | Status: DC | PRN
  Administered 2015-04-25: 0.4 mg via INTRAVENOUS

## 2015-04-25 MED ORDER — SALINE NASAL SPRAY 0.65 % NA SOLN: 1.0000 | NASAL | Status: DC | PRN

## 2015-04-25 MED ORDER — ROCURONIUM BROMIDE 50 MG/5ML IV SOLN
INTRAVENOUS | Status: AC
  Filled 2015-04-25: qty 1

## 2015-04-25 MED ORDER — FENTANYL CITRATE (PF) 100 MCG/2ML IJ SOLN
25.0000 ug | INTRAMUSCULAR | Status: AC | PRN
  Administered 2015-04-25 (×2): 25 ug via INTRAVENOUS

## 2015-04-25 MED ORDER — BSS PLUS IO SOLN
INTRAOCULAR | Status: AC
  Filled 2015-04-25: qty 500

## 2015-04-25 MED ORDER — LATANOPROST 0.005 % OP SOLN: 1.0000 [drp] | Freq: Every day | OPHTHALMIC | Status: DC

## 2015-04-25 MED ORDER — ACETAMINOPHEN 500 MG PO TABS
500.0000 mg | ORAL_TABLET | Freq: Two times a day (BID) | ORAL | Status: DC
  Administered 2015-04-25 – 2015-04-26 (×2): 500 mg via ORAL
  Filled 2015-04-25 (×2): qty 1

## 2015-04-25 MED ORDER — SODIUM HYALURONATE 10 MG/ML IO SOLN
INTRAOCULAR | Status: AC
  Filled 2015-04-25: qty 0.85

## 2015-04-25 MED ORDER — PREDNISOLONE ACETATE 1 % OP SUSP
1.0000 [drp] | Freq: Four times a day (QID) | OPHTHALMIC | Status: DC
  Administered 2015-04-26: 1 [drp] via OPHTHALMIC
  Filled 2015-04-25: qty 5

## 2015-04-25 MED ORDER — TETRACAINE HCL 0.5 % OP SOLN
2.0000 [drp] | Freq: Once | OPHTHALMIC | Status: DC
  Filled 2015-04-25: qty 2

## 2015-04-25 MED ORDER — PINDOLOL 5 MG PO TABS
2.5000 mg | ORAL_TABLET | Freq: Every day | ORAL | Status: DC
  Administered 2015-04-26: 2.5 mg via ORAL
  Filled 2015-04-25 (×2): qty 1

## 2015-04-25 MED ORDER — PROPOFOL 10 MG/ML IV BOLUS
INTRAVENOUS | Status: AC
  Filled 2015-04-25: qty 20

## 2015-04-25 MED ORDER — MORPHINE SULFATE 2 MG/ML IJ SOLN
1.0000 mg | INTRAMUSCULAR | Status: DC | PRN
  Administered 2015-04-25: 2 mg via INTRAVENOUS
  Filled 2015-04-25: qty 1

## 2015-04-25 MED ORDER — HYDROCODONE-ACETAMINOPHEN 5-325 MG PO TABS
1.0000 | ORAL_TABLET | ORAL | Status: DC | PRN
  Administered 2015-04-25: 2 via ORAL

## 2015-04-25 MED ORDER — MAGNESIUM HYDROXIDE 400 MG/5ML PO SUSP: 15.0000 mL | Freq: Four times a day (QID) | ORAL | Status: DC | PRN

## 2015-04-25 MED ORDER — HYDRALAZINE HCL 20 MG/ML IJ SOLN
INTRAMUSCULAR | Status: DC | PRN
  Administered 2015-04-25 (×3): 2.5 mg via INTRAVENOUS

## 2015-04-25 MED ORDER — BACITRACIN-POLYMYXIN B 500-10000 UNIT/GM OP OINT
TOPICAL_OINTMENT | OPHTHALMIC | Status: AC
  Filled 2015-04-25: qty 3.5

## 2015-04-25 MED ORDER — PANTOPRAZOLE SODIUM 40 MG PO TBEC
40.0000 mg | DELAYED_RELEASE_TABLET | Freq: Every day | ORAL | Status: DC
  Administered 2015-04-25 – 2015-04-26 (×2): 40 mg via ORAL
  Filled 2015-04-25 (×2): qty 1

## 2015-04-25 MED ORDER — LIDOCAINE HCL (CARDIAC) 20 MG/ML IV SOLN
INTRAVENOUS | Status: DC | PRN
Start: 2015-04-25 — End: 2015-04-25
  Administered 2015-04-25: 100 mg via INTRAVENOUS

## 2015-04-25 MED ORDER — IMIPRAMINE HCL 50 MG PO TABS
50.0000 mg | ORAL_TABLET | Freq: Every day | ORAL | Status: DC
  Administered 2015-04-25: 50 mg via ORAL
  Filled 2015-04-25 (×2): qty 1

## 2015-04-25 MED ORDER — LACTATED RINGERS IV SOLN
INTRAVENOUS | Status: DC | PRN
  Administered 2015-04-25: 13:00:00 via INTRAVENOUS

## 2015-04-25 MED ORDER — EPINEPHRINE HCL 1 MG/ML IJ SOLN
INTRAMUSCULAR | Status: DC | PRN
  Administered 2015-04-25: 500 mL

## 2015-04-25 MED ORDER — BACITRACIN-POLYMYXIN B 500-10000 UNIT/GM OP OINT
TOPICAL_OINTMENT | OPHTHALMIC | Status: DC | PRN
  Administered 2015-04-25: 1 via OPHTHALMIC

## 2015-04-25 MED ORDER — FENTANYL CITRATE (PF) 250 MCG/5ML IJ SOLN
INTRAMUSCULAR | Status: AC
  Filled 2015-04-25: qty 5

## 2015-04-25 MED ORDER — LATANOPROST 0.005 % OP SOLN
1.0000 [drp] | Freq: Every day | OPHTHALMIC | Status: DC
  Filled 2015-04-25: qty 2.5

## 2015-04-25 MED ORDER — STERILE WATER FOR IRRIGATION IR SOLN
Status: DC | PRN
  Administered 2015-04-25: 200 mL

## 2015-04-25 MED ORDER — ONDANSETRON HCL 4 MG/2ML IJ SOLN: 4.0000 mg | Freq: Four times a day (QID) | INTRAMUSCULAR | Status: DC | PRN

## 2015-04-25 MED ORDER — NEOSTIGMINE METHYLSULFATE 10 MG/10ML IV SOLN
INTRAVENOUS | Status: DC | PRN
  Administered 2015-04-25: .5 mg via INTRAVENOUS

## 2015-04-25 MED ORDER — FENTANYL CITRATE (PF) 100 MCG/2ML IJ SOLN
INTRAMUSCULAR | Status: DC | PRN
  Administered 2015-04-25 (×4): 25 ug via INTRAVENOUS

## 2015-04-25 MED ORDER — LIDOCAINE HCL (CARDIAC) 20 MG/ML IV SOLN
INTRAVENOUS | Status: AC
  Filled 2015-04-25: qty 5

## 2015-04-25 MED ORDER — PROPOFOL 10 MG/ML IV BOLUS
INTRAVENOUS | Status: DC | PRN
  Administered 2015-04-25: 70 mg via INTRAVENOUS

## 2015-04-25 MED ORDER — LEVOTHYROXINE SODIUM 50 MCG PO TABS
50.0000 ug | ORAL_TABLET | Freq: Every day | ORAL | Status: DC
  Administered 2015-04-26: 50 ug via ORAL
  Filled 2015-04-25: qty 1

## 2015-04-25 MED ORDER — DEXAMETHASONE SODIUM PHOSPHATE 10 MG/ML IJ SOLN
INTRAMUSCULAR | Status: DC | PRN
  Administered 2015-04-25: 10 mg

## 2015-04-25 MED ORDER — BRIMONIDINE TARTRATE 0.2 % OP SOLN
1.0000 [drp] | Freq: Two times a day (BID) | OPHTHALMIC | Status: DC
  Filled 2015-04-25: qty 5

## 2015-04-25 MED ORDER — SODIUM CHLORIDE 0.45 % IV SOLN
INTRAVENOUS | Status: DC
  Administered 2015-04-25: 20:00:00 via INTRAVENOUS

## 2015-04-25 MED ORDER — SODIUM CHLORIDE 0.9 % IJ SOLN
INTRAMUSCULAR | Status: AC
  Filled 2015-04-25: qty 10

## 2015-04-25 MED ORDER — GENTAMICIN SULFATE 40 MG/ML IJ SOLN
INTRAMUSCULAR | Status: AC
  Filled 2015-04-25: qty 2

## 2015-04-25 MED ORDER — SODIUM CHLORIDE 0.9 % IV SOLN: Freq: Once | INTRAVENOUS | Status: DC

## 2015-04-25 MED ORDER — KETOROLAC TROMETHAMINE 15 MG/ML IJ SOLN
INTRAMUSCULAR | Status: AC
  Filled 2015-04-25: qty 1

## 2015-04-25 MED ORDER — EPINEPHRINE HCL 1 MG/ML IJ SOLN
INTRAMUSCULAR | Status: AC
  Filled 2015-04-25: qty 1

## 2015-04-25 MED ORDER — TRIAMCINOLONE ACETONIDE 40 MG/ML IJ SUSP
INTRAMUSCULAR | Status: AC
  Filled 2015-04-25: qty 5

## 2015-04-25 SURGICAL SUPPLY — 67 items
APPLICATOR DR MATTHEWS STRL (MISCELLANEOUS) IMPLANT
BLADE EYE CATARACT 19 1.4 BEAV (BLADE) IMPLANT
BLADE KERATOME 2.75 (BLADE) ×4 IMPLANT
BLADE KERATOME 2.75MM (BLADE) ×2
CANNULA VLV SOFT TIP 25GA (OPHTHALMIC) ×3 IMPLANT
CONT SPEC STER OR (MISCELLANEOUS) ×3 IMPLANT
CORDS BIPOLAR (ELECTRODE) ×3 IMPLANT
COTTONBALL LRG STERILE PKG (GAUZE/BANDAGES/DRESSINGS) ×9 IMPLANT
COVER MAYO STAND STRL (DRAPES) ×3 IMPLANT
DRAPE INCISE 51X51 W/FILM STRL (DRAPES) IMPLANT
DRAPE OPHTHALMIC 77X100 STRL (CUSTOM PROCEDURE TRAY) ×3 IMPLANT
FILTER BLUE MILLIPORE (MISCELLANEOUS) IMPLANT
FORCEPS ECKARDT ILM 25G SERR (OPHTHALMIC RELATED) ×3 IMPLANT
FORCEPS GRIESHABER ILM 25G A (INSTRUMENTS) ×3 IMPLANT
FORCEPS HORIZONTAL 25G DISP (OPHTHALMIC RELATED) IMPLANT
GAS OPHTHALMIC (MISCELLANEOUS) IMPLANT
GLOVE SS BIOGEL STRL SZ 6.5 (GLOVE) ×2 IMPLANT
GLOVE SS BIOGEL STRL SZ 7 (GLOVE) ×1 IMPLANT
GLOVE SUPERSENSE BIOGEL SZ 6.5 (GLOVE) ×4
GLOVE SUPERSENSE BIOGEL SZ 7 (GLOVE) ×2
GLOVE SURG 8.5 LATEX PF (GLOVE) ×3 IMPLANT
GLOVE SURG SS PI 6.5 STRL IVOR (GLOVE) ×3 IMPLANT
GLOVE SURG SS PI 7.0 STRL IVOR (GLOVE) ×3 IMPLANT
GOWN STRL REUS W/ TWL LRG LVL3 (GOWN DISPOSABLE) ×3 IMPLANT
GOWN STRL REUS W/TWL LRG LVL3 (GOWN DISPOSABLE) ×6
HANDLE PNEUMATIC FOR CONSTEL (OPHTHALMIC) ×3 IMPLANT
KIT BASIN OR (CUSTOM PROCEDURE TRAY) ×3 IMPLANT
KIT ROOM TURNOVER OR (KITS) ×3 IMPLANT
KNIFE CRESCENT 1.75 EDGEAHEAD (BLADE) IMPLANT
LENS IOL POST 1PIECE DIOP 17.0 (Intraocular Lens) ×3 IMPLANT
MICROPICK 25G (MISCELLANEOUS)
NEEDLE 18GX1X1/2 (RX/OR ONLY) (NEEDLE) ×3 IMPLANT
NEEDLE 25GX 5/8IN NON SAFETY (NEEDLE) ×6 IMPLANT
NEEDLE 27GX1/2 REG BEVEL ECLIP (NEEDLE) ×3 IMPLANT
NEEDLE FILTER BLUNT 18X 1/2SAF (NEEDLE) ×2
NEEDLE FILTER BLUNT 18X1 1/2 (NEEDLE) ×1 IMPLANT
NEEDLE HYPO 30X.5 LL (NEEDLE) ×3 IMPLANT
NS IRRIG 1000ML POUR BTL (IV SOLUTION) ×3 IMPLANT
PACK VITRECTOMY CUSTOM (CUSTOM PROCEDURE TRAY) ×3 IMPLANT
PAD ARMBOARD 7.5X6 YLW CONV (MISCELLANEOUS) ×6 IMPLANT
PAK PIK VITRECTOMY CVS 25GA (OPHTHALMIC) ×3 IMPLANT
PENCIL BIPOLAR 25GA STR DISP (OPHTHALMIC RELATED) IMPLANT
PIC ILLUMINATED 25G (OPHTHALMIC) ×3
PICK MICROPICK 25G (MISCELLANEOUS) IMPLANT
PIK ILLUMINATED 25G (OPHTHALMIC) ×1 IMPLANT
PROBE LASER ILLUM FLEX CVD 25G (OPHTHALMIC) ×3 IMPLANT
ROLLS DENTAL (MISCELLANEOUS) ×6 IMPLANT
SCRAPER DIAMOND 25GA (OPHTHALMIC RELATED) IMPLANT
SPEAR EYE SURG WECK-CEL (MISCELLANEOUS) ×21 IMPLANT
SPONGE SURGIFOAM ABS GEL 12-7 (HEMOSTASIS) ×3 IMPLANT
STOPCOCK 4 WAY LG BORE MALE ST (IV SETS) IMPLANT
SUT CHROMIC 6 0 G 1 (SUTURE) ×3 IMPLANT
SUT CHROMIC 7 0 TG140 8 (SUTURE) ×3 IMPLANT
SUT ETHILON 10 0 CS140 6 (SUTURE) ×3 IMPLANT
SUT ETHILON 9 0 TG140 8 (SUTURE) IMPLANT
SUT POLY NON ABSORB 10-0 8 STR (SUTURE) ×6 IMPLANT
SUT SILK 4 0 RB 1 (SUTURE) ×3 IMPLANT
SUT VICRYL 7 0 TG140 8 (SUTURE) ×3 IMPLANT
SYR 20CC LL (SYRINGE) ×3 IMPLANT
SYR 5ML LL (SYRINGE) IMPLANT
SYR BULB 3OZ (MISCELLANEOUS) ×3 IMPLANT
SYR TB 1ML LUER SLIP (SYRINGE) ×3 IMPLANT
SYRINGE 10CC LL (SYRINGE) IMPLANT
TAPE SURG TRANSPORE 1 IN (GAUZE/BANDAGES/DRESSINGS) ×1 IMPLANT
TAPE SURGICAL TRANSPORE 1 IN (GAUZE/BANDAGES/DRESSINGS) ×2
TOWEL OR 17X26 10 PK STRL BLUE (TOWEL DISPOSABLE) ×3 IMPLANT
WATER STERILE IRR 1000ML POUR (IV SOLUTION) ×3 IMPLANT

## 2015-04-25 NOTE — H&P (Signed)
I examined the patient today and there is no change in the medical status 

## 2015-04-25 NOTE — Brief Op Note (Signed)
Brief Operative note   Preoperative diagnosis:  dislocated intra ocular lens right eye Postoperative diagnosis  * No Diagnosis Codes entered *  Procedures: Pars plana vitrectomy, laser, gas injection.  Removal of dislocated intraocular lens from the vitreous.  Placement of secondary intraocular lens with suture right eye  Surgeon:  Hayden Pedro, MD...  Assistant:  Deatra Ina SA  Anesthesia: General  Specimen: none  Estimated blood loss:  1cc  Complications: none  Patient sent to PACU in good condition  Composed by Hayden Pedro MD  Dictation number: [id number]

## 2015-04-25 NOTE — Transfer of Care (Signed)
Immediate Anesthesia Transfer of Care Note  Patient: [name]  Procedure(s) Performed: Procedure(s): PARS PLANA VITRECTOMY WITH 25G REMOVAL/SUTURE INTRAOCULAR LENS; HEADSCOPE LASER;GAS FLUID EXCHANGE (Right)  Patient Location: PACU  Anesthesia Type:General  Level of Consciousness: awake, alert  and patient cooperative  Airway & Oxygen Therapy: Patient Spontanous Breathing and Patient connected to nasal cannula oxygen  Post-op Assessment: Report given to RN and Post -op Vital signs reviewed and stable  Post vital signs: Reviewed and stable  Last Vitals:  Filed Vitals:   04/25/15 0915  BP: 160/65  Pulse: 50  Temp: 36.3 C  Resp: 18    Complications: No apparent anesthesia complications

## 2015-04-25 NOTE — Anesthesia Postprocedure Evaluation (Signed)
  Anesthesia Post-op Note  Patient: [name]  Procedure(s) Performed: Procedure(s): PARS PLANA VITRECTOMY WITH 25G REMOVAL/SUTURE INTRAOCULAR LENS; HEADSCOPE LASER;GAS FLUID EXCHANGE (Right)  Patient Location: PACU  Anesthesia Type:General  Level of Consciousness: awake and alert   Airway and Oxygen Therapy: Patient Spontanous Breathing  Post-op Pain: mild  Post-op Assessment: Post-op Vital signs reviewed, Patient's Cardiovascular Status Stable, Respiratory Function Stable, Patent Airway, No signs of Nausea or vomiting and Pain level controlled  Post-op Vital Signs: Reviewed and stable  Last Vitals:  Filed Vitals:   04/25/15 1615  BP:   Pulse: 48  Temp:   Resp: 15    Complications: No apparent anesthesia complications

## 2015-04-25 NOTE — Anesthesia Preprocedure Evaluation (Addendum)
Anesthesia Evaluation  Patient identified by MRN, date of birth, ID band Patient awake    Airway Mallampati: II  TM Distance: >3 FB Neck ROM: Full  Mouth opening: Limited Mouth Opening  Dental  (+) Teeth Intact   Pulmonary pneumonia -, COPD breath sounds clear to auscultation        Cardiovascular hypertension, + CAD, + Past MI, + Peripheral Vascular Disease and +CHF + dysrhythmias Rhythm:Regular Rate:Normal     Neuro/Psych    GI/Hepatic GERD-  ,  Endo/Other  Hypothyroidism   Renal/GU      Musculoskeletal  (+) Arthritis -,   Abdominal   Peds  Hematology   Anesthesia Other Findings   Reproductive/Obstetrics                            Anesthesia Physical Anesthesia Plan  ASA: III  Anesthesia Plan: General   Post-op Pain Management:    Induction: Intravenous  Airway Management Planned: Oral ETT  Additional Equipment:   Intra-op Plan:   Post-operative Plan: Extubation in OR  Informed Consent: I have reviewed the patients History and Physical, chart, labs and discussed the procedure including the risks, benefits and alternatives for the proposed anesthesia with the patient or authorized representative who has indicated his/her understanding and acceptance.   Dental advisory given  Plan Discussed with: CRNA and Surgeon  Anesthesia Plan Comments:         Anesthesia Quick Evaluation

## 2015-04-25 NOTE — Anesthesia Procedure Notes (Signed)
Procedure Name: Intubation Date/Time: 04/25/2015 12:53 PM Performed by: Scheryl Darter Pre-anesthesia Checklist: Patient identified, Emergency Drugs available, Suction available, Patient being monitored and Timeout performed Patient Re-evaluated:Patient Re-evaluated prior to inductionOxygen Delivery Method: Circle system utilized Preoxygenation: Pre-oxygenation with 100% oxygen Intubation Type: IV induction Ventilation: Mask ventilation without difficulty Laryngoscope Size: Miller and 2 Grade View: Grade II Tube type: Oral Tube size: 7.0 mm Number of attempts: 1 Airway Equipment and Method: Stylet Placement Confirmation: ETT inserted through vocal cords under direct vision,  positive ETCO2 and breath sounds checked- equal and bilateral Secured at: 21 cm Tube secured with: Tape Dental Injury: Teeth and Oropharynx as per pre-operative assessment

## 2015-04-26 ENCOUNTER — Encounter (HOSPITAL_COMMUNITY): Payer: Self-pay | Admitting: Ophthalmology

## 2015-04-26 ENCOUNTER — Encounter: Payer: Self-pay | Admitting: Internal Medicine

## 2015-04-26 DIAGNOSIS — T8522XA Displacement of intraocular lens, initial encounter: Secondary | ICD-10-CM | POA: Diagnosis not present

## 2015-04-26 MED ORDER — PREDNISOLONE ACETATE 1 % OP SUSP: 1.0000 [drp] | Freq: Four times a day (QID) | OPHTHALMIC | Status: DC

## 2015-04-26 MED ORDER — BACITRACIN-POLYMYXIN B 500-10000 UNIT/GM OP OINT: 1.0000 "application " | TOPICAL_OINTMENT | Freq: Three times a day (TID) | OPHTHALMIC | Status: DC

## 2015-04-26 MED ORDER — GATIFLOXACIN 0.5 % OP SOLN: 1.0000 [drp] | Freq: Four times a day (QID) | OPHTHALMIC | Status: DC

## 2015-04-26 NOTE — Progress Notes (Signed)
Notified Dr. Zigmund Daniel that patient had not voided since she was in and out cath at approximately 0300 and that a bladder scan of 420cc was done around 1015 . Also  Notified Dr. Zigmund Daniel that [facility] nurse ask for an order to in out cath patient after discharge. Dr. Zigmund Daniel stated that he would not put in an order for patient to be in and out cath after discharge and that patient's primary doctor at [facility] needs to be made aware and  check patient's ability to void. This information from Dr.Matthews was told to [facility] the nurse at [facility].

## 2015-04-26 NOTE — Progress Notes (Signed)
04/26/2015, 6:38 AM  Mental Status:  Awake, Alert, Oriented  Anterior segment: Cornea  Clear  Wound intact with conj covering.  Good IOP    Anterior Chamber Bubble in AC 50%    Lens:   IOL in place with mid dilated pupil  Intra Ocular Pressure 21 mmHg with Tonopen  Vitreous: Clear 10%gas bubble   Retina:  Attached Good laser reaction   Impression: Excellent result Retina attached   Final Diagnosis: Active Problems:   Dislocated IOL (intraocular lens), posterior   Plan: start post operative eye drops.  Discharge to home.  Give post operative instructions  Hayden Pedro 04/26/2015, 6:38 AM

## 2015-04-26 NOTE — Op Note (Signed)
[name], [name]                ACCOUNT NO.:  [id number]  MEDICAL RECORD NO.:  [id number]  LOCATION:  6N12C                        FACILITY:  [facility]  PHYSICIAN:  Chrystie Nose. Zigmund Daniel, M.D. DATE OF BIRTH:  Aug 03, 1920  DATE OF PROCEDURE:  04/25/2015 DATE OF DISCHARGE:                              OPERATIVE REPORT   ADMISSION DIAGNOSIS:  Dislocated intraocular lens, right eye.  PROCEDURES:  Pars plana vitrectomy, retinal photocoagulation, removal of intraocular lens, with all capsular remnants from the vitreous cavity, retinal photocoagulation, placement of secondary intraocular lens, in the posterior chamber with suture in the ciliary sulcus, gas fluid exchange, all in the right eye.  SURGEON:  Chrystie Nose. Zigmund Daniel, M.D.  ASSISTANT:  Deatra Ina SA.  ANESTHESIA:  General.  DETAILS:  Usual prep and drape, the indirect ophthalmoscope laser was moved into place.  Three hundred ninety-six burns were placed around the retinal periphery.  The power was 400 mW, 1000 microns each and 0.1 seconds each.  The attention was then carried to the pars plana area, conjunctival peritomy from 7 o'clock around to 4 o'clock.  A lateral canthotomy was performed on the eyelid.  A three-layered corneal wound was opened from 10 o'clock to 2 o'clock.  The 25-gauge trocars were placed at 8, 10, and 2 o'clock.  Half thickness scleral flaps were raised at 2 o'clock and 7 o'clock in anticipation of IOL suture.  The pars plana vitrectomy was begun just behind the pseudophakos, which was dislocated into the vitreous cavity.  The vitreous was trimmed from its attachments to the pseudophakos.  The pseudophakos was in an entire capsular bag and entire bag was dislocated.  There was a large quantity of lens material in the bag and around the intraocular lens.  The intraocular lens was freed from its attachments to the zonules and the vitreous with the vitreous cutter.  Inspection of the peripheral vitreous showed it  to be trimmed shortly down to the vitreous base. Once the intraocular lens was freed, the corneal scleral wound was opened with a three-layered keratome incision.  The intraocular lens was passed from the posterior segment into the anterior segment and out through the corneoscleral wound.  A new intraocular lens made by [employer] [id number], power 17.0 D, length 12.5 mm, optic 7.0 mm, serial #[id number], expiration date October 2018, was brought onto the field.  The implant was inspected and cleaned.  Two Prolene sutures were passed beneath the scleral flaps at 2 o'clock down to 7 o'clock and behind the iris, so that the intraocular lens would sit in the ciliary sulcus.  The sutures were drawn out through the corneoscleral wound and attached to the eyelids of the lens.  Tripled knots were used.  The lens was then passed into the anterior chamber and then into the posterior chamber.  It was dialed into place and secured with Prolene suture.  The sutures were externalized and knotted beneath the scleral flap.  The flaps were allowed to close over the sutures and hide the knots.  The corneal wound was closed with 5 interrupted 10-0 nylon sutures.  The wound was tested and found to be secured.  Additional vitrectomy was  carried out.  At this point, some capsular remnants were seen on the surface of the retina.  These were carefully removed under low suction and rapid cutting.  Inspection of the retina showed full attachment of the retina.  A 40% gas fluid exchange was then carried out.  The instruments were removed from the eye.  Gas was also present in the anterior chamber.  The intraocular lens was in good central position. The scleral trocars were removed.  The wounds were tested and found to be secured.  The corneoscleral wound was tested again and found to be secured.  The conjunctiva was closed with 7-0 chromic suture.  The lateral canthotomy was closed with 6-0 chromic  suture. Polymyxin and gentamicin were irrigated into tenon space.  Marcaine was injected around the globe for postop pain.  Closing pressure was 10 with a [place] tonometer.  Polysporin ophthalmic ointment, a patch, and shield were placed.  The patient was awakened and taken to recovery in satisfactory condition.     Chrystie Nose. Zigmund Daniel, M.D.     JDM/MEDQ  D:  04/25/2015  T:  04/26/2015  Job:  [id number]

## 2015-04-26 NOTE — Discharge Summary (Signed)
Discharge summary not needed on [facility] patients per medical records. 

## 2015-04-26 NOTE — Progress Notes (Signed)
Pt urinated a small amount of urine ; did a bladder scan and shows 548 ml of urine in the bladder. Did an in and out cath and got 700 ml of urine out.

## 2015-04-26 NOTE — Progress Notes (Signed)
Reviewed AVS discharge instructions with patient and her caregiver Colletta Maryland.  Also reviewed discharge instructions with the [place] who cares for patient at [facility]. AVS discharge instructions and pt's three eyedrops, Zymaxid, Prednisolone, Polysporin along with eye shield and eye pads were given to [facility] pt's caregiver. Also discussed with Cerese the nurse at [facility] that patient had not voided on own since she last voided yesterday after surgery, also made nurse aware that Dr.Matthews stated that pt's primary doctor may need to check pt's ability to void on own after discharge and that no order will be placed to in and out cath patient after discharge. Made nurse aware that pt was bladder scan this morning for 420cc and an in and out cath was done with 600cc of urine. Patient was assist by wheelchair with her caregiver Colletta Maryland to their transportation.

## 2015-04-27 ENCOUNTER — Encounter: Payer: Self-pay | Admitting: Adult Health

## 2015-04-27 ENCOUNTER — Non-Acute Institutional Stay: Payer: Medicare Other | Admitting: Adult Health

## 2015-04-27 DIAGNOSIS — Z9889 Other specified postprocedural states: Secondary | ICD-10-CM

## 2015-04-27 DIAGNOSIS — R5383 Other fatigue: Secondary | ICD-10-CM | POA: Diagnosis not present

## 2015-04-27 DIAGNOSIS — R339 Retention of urine, unspecified: Secondary | ICD-10-CM | POA: Diagnosis not present

## 2015-04-27 NOTE — Progress Notes (Signed)
[name], female   DOB: [birth year], 81 y.o.   MRN: [id number]      Nursing Home Location:  [place]   Code Status: DNR with most form   Place of Service: ALF (13)  Chief Complaint  Patient presents with  . Acute Visit    f/u right eye surgery with difficulty voiding    HPI:  This is a 81 y.o. Female residing at [facility], assisted living section. She underwent a right pars plana vitrectomy by Dr. Zigmund Daniel on 04/25/15.  [facility]. Initially she had difficulty voiding and was catheterized two times. Since then she has been able to void. The staff reports that she has been sleepy but this has improved over time. She is ambulatory with  A walker and wearing TED hose.  She has not had any pain medication other than Tylenol.  Review of Systems:  Review of Systems  Constitutional: Negative for fever, chills, activity change and appetite change.  HENT: Negative for congestion.   Eyes: Positive for redness. Negative for pain, discharge and itching.  Respiratory: Negative for cough, shortness of breath and wheezing.   Cardiovascular: Negative for chest pain and leg swelling.  Gastrointestinal: Negative for constipation and abdominal distention.  Genitourinary: Positive for difficulty urinating. Negative for dysuria, frequency and flank pain.  Neurological: Negative for dizziness and facial asymmetry.  Psychiatric/Behavioral: Negative for confusion and agitation.    Medications: Patient's Medications  New Prescriptions   No medications on file  Previous Medications   ACETAMINOPHEN (TYLENOL) 500 MG TABLET    Take 500 mg by mouth 2 (two) times daily. Take two tablets twice a day   AMOXICILLIN (AMOXIL) 500 MG TABLET    Take 500 mg by mouth as needed (Dental appointment). Take 4 capsules one hour prior to dental appointment   ASPIRIN EC 81 MG TABLET    Take 81 mg by mouth every evening.   ATORVASTATIN  (LIPITOR) 20 MG TABLET    TAKE 1 TABLET BY MOUTH DAILY TO LOWER LIPIDS   BACITRACIN-POLYMYXIN B (POLYSPORIN) OPHTHALMIC OINTMENT    Place 1 application into the right eye 3 (three) times daily. apply to eye every 8 hours while awake   BRINZOLAMIDE-BRIMONIDINE (SIMBRINZA) 1-0.2 % SUSP    Apply to eye. One drop in both eyes twice daily   EZETIMIBE (ZETIA) 10 MG TABLET    Take 10 mg by mouth daily.   FUROSEMIDE (LASIX) 20 MG TABLET    Take 20 mg by mouth daily.   GATIFLOXACIN (ZYMAXID) 0.5 % SOLN    Place 1 drop into the right eye 4 (four) times daily.   HYDROCODONE-ACETAMINOPHEN (NORCO) 5-325 MG PER TABLET    Take 1 tablet by mouth every 6 (six) hours as needed for severe pain.   IMIPRAMINE (TOFRANIL) 50 MG TABLET    Take 50 mg by mouth at bedtime.   LATANOPROST (XALATAN) 0.005 % OPHTHALMIC SOLUTION    Place 1 drop into both eyes every evening. UAD   LEVOTHYROXINE (SYNTHROID, LEVOTHROID) 50 MCG TABLET    TAKE 1 TABLET BY MOUTH ONCE DAILY   LISINOPRIL (PRINIVIL,ZESTRIL) 10 MG TABLET    Take 1 tablet (10 mg total) by mouth 2 (two) times daily.   LORATADINE (CLARITIN) 10 MG TABLET    Take 10 mg by mouth daily.   MELATONIN 5 MG TABS    Take 1 tablet by mouth at bedtime and may repeat dose one time if needed. Take  one tablet at bedtime as needed for insomnia   MULTIPLE VITAMIN (MULTIVITAMIN WITH MINERALS) TABS    Take 1 tablet by mouth daily. Certavite-A   MULTIPLE VITAMINS-MINERALS (PRESERVISION/LUTEIN) CAPS    Take by mouth. Take one twice daily for eyes   OMEPRAZOLE (PRILOSEC) 20 MG CAPSULE    Take 40 mg by mouth daily.    PINDOLOL (VISKEN) 5 MG TABLET    Take 2.5 mg by mouth daily.   PREDNISOLONE ACETATE (PRED FORTE) 1 % OPHTHALMIC SUSPENSION    Place 1 drop into the right eye 4 (four) times daily.   SALINE NASAL SPRAY NA    Place into the nose. One spray nasal every 2 hours up to 24 hours as needed   TROLAMINE SALICYLATE (ASPERCREME) 10 % CREAM    Apply 1 application topically as needed for muscle  pain.  Modified Medications   No medications on file  Discontinued Medications   No medications on file     Physical Exam:  Filed Vitals:   04/27/15 1017  BP: 132/91  Pulse: 80  Temp: 97.4 F (36.3 C)  Resp: 18  SpO2: 98%    Physical Exam  Constitutional: She is oriented to person, place, and time. No distress.  HENT:  Head: Normocephalic and atraumatic.  Eyes: Right eye exhibits no discharge. Left eye exhibits no discharge.  Erythema to the right conjunctiva and bruising below the eye. No drainage.  Cardiovascular: Normal heart sounds.   Irregular rhythm  Pulmonary/Chest: Effort normal and breath sounds normal. No respiratory distress. She has no wheezes.  Abdominal: Soft. Bowel sounds are normal. She exhibits no distension. There is no tenderness.  Neurological: She is alert and oriented to person, place, and time.  Skin: Skin is warm and dry. She is not diaphoretic.  Psychiatric: Affect normal.    Labs reviewed/Significant Diagnostic Results:  Basic Metabolic Panel:  Recent Labs  10/08/14 0325 10/14/14 1138 10/27/14 03/23/15 04/18/15 1141  NA 137 136 138 139 139  K 4.8 4.7 4.0 4.0 4.1  CL 102 102  --   --  106  CO2 24 26  --   --  27  GLUCOSE 87 99  --   --  126*  BUN 20 15 21  24* 24*  CREATININE 1.08 0.95 1.0 0.9 1.00  CALCIUM 8.6 9.1  --   --  8.7   Liver Function Tests:  Recent Labs  10/06/14 1635 03/23/15  AST 34 17  ALT 29 14  ALKPHOS 137* 157*  BILITOT 0.6  --   PROT 7.7  --   ALBUMIN 3.2*  --    No results for input(s): LIPASE, AMYLASE in the last 8760 hours. No results for input(s): AMMONIA in the last 8760 hours. CBC:  Recent Labs  09/02/14 1136 09/08/14 1230 10/06/14 1635 03/23/15 04/18/15 1141  WBC 6.4 10.4 7.8 8.9 7.3  NEUTROABS 4.3 7.6 5.0  --   --   HGB 14.6 14.0 13.2 14.0 12.4  HCT 43.0 42.4 40.2 41 39.4  MCV 97.7 98.6 98.0  --  103.4*  PLT 173 182 232 199 197   CBG: No results for input(s): GLUCAP in the last 8760  hours. TSH:  Recent Labs  05/24/14 1003 03/23/15  TSH 4.310 1.56   A1C: Lab Results  Component Value Date   HGBA1C 5.7 12/21/2013   Lipid Panel: No results for input(s): CHOL, HDL, LDLCALC, TRIG, CHOLHDL, LDLDIRECT in the last 8760 hours.     Assessment/Plan  1) s/p  right pars plana vitrectomy surgery -Continue meds prescribe by surgeon, f/u as indicated -continue TED hose -encourage fluid -Monitor VS qshift for 3 days  2) Urinary retention Now voiding well, continue to monitor  3) Lethargy Improving per staff, most likely due to anesthesia. Will continue to monitor. No signs of a neurological event. She is eating, drinking, and ambulating at this time.   Cindi Carbon, ANP [facility] [phone]

## 2015-05-02 ENCOUNTER — Encounter (INDEPENDENT_AMBULATORY_CARE_PROVIDER_SITE_OTHER): Payer: Medicare Other | Admitting: Ophthalmology

## 2015-05-02 DIAGNOSIS — H2701 Aphakia, right eye: Secondary | ICD-10-CM

## 2015-05-03 NOTE — Discharge Summary (Signed)
Discharge summary not needed on [facility] patients per medical records. 

## 2015-05-17 ENCOUNTER — Non-Acute Institutional Stay: Payer: Medicare Other | Admitting: Nurse Practitioner

## 2015-05-17 ENCOUNTER — Encounter: Payer: Self-pay | Admitting: Nurse Practitioner

## 2015-05-17 VITALS — BP 112/60 | HR 56 | Temp 97.5°F | Ht 66.5 in | Wt 143.0 lb

## 2015-05-17 DIAGNOSIS — R059 Cough, unspecified: Secondary | ICD-10-CM

## 2015-05-17 DIAGNOSIS — R413 Other amnesia: Secondary | ICD-10-CM | POA: Diagnosis not present

## 2015-05-17 DIAGNOSIS — I2510 Atherosclerotic heart disease of native coronary artery without angina pectoris: Secondary | ICD-10-CM | POA: Diagnosis not present

## 2015-05-17 DIAGNOSIS — R339 Retention of urine, unspecified: Secondary | ICD-10-CM

## 2015-05-17 DIAGNOSIS — R05 Cough: Secondary | ICD-10-CM

## 2015-05-17 DIAGNOSIS — Z418 Encounter for other procedures for purposes other than remedying health state: Secondary | ICD-10-CM

## 2015-05-17 DIAGNOSIS — I10 Essential (primary) hypertension: Secondary | ICD-10-CM

## 2015-05-17 DIAGNOSIS — E039 Hypothyroidism, unspecified: Secondary | ICD-10-CM

## 2015-05-17 DIAGNOSIS — I5032 Chronic diastolic (congestive) heart failure: Secondary | ICD-10-CM

## 2015-05-17 DIAGNOSIS — Z299 Encounter for prophylactic measures, unspecified: Secondary | ICD-10-CM

## 2015-05-17 DIAGNOSIS — I495 Sick sinus syndrome: Secondary | ICD-10-CM

## 2015-05-17 NOTE — Progress Notes (Signed)
[name], female   DOB: August 29, 1920, 81 y.o.   MRN: [id number]    Nursing Home Location:  [place] of Service: Clinic (12)  PCP: REED, Jonelle Sidle, DO  No Known Allergies  Chief Complaint  Patient presents with  . Annual Exam    Comprehensive exam: blood pressure, thyroid, CAD, edema, history of falls    HPI:  Patient is a 81 y.o. female seen today at [facility] being seen today for comprehensive exam. In the last year pt has moved from independent living to AL.  Pt was hospitalized in October 2015, due to CHF and has followed closely with cardiology, opted not to get a pacemaker.  Recently s/p right pars plana vitrectomy surgery, no improvements in vision noted yet but states it could take several months.   Having PT to right shoulder, limited ROM with increased tenderness after a fall, improvement since therapy has started  Screenings: Colon Cancer- aged out has not had Breast Cancer- aged out Cervical Cancer- aged out Osteoporosis- Dexa Scan unknown  Smoking status: never Alcohol use: occasional cocktail   Dentist: routinely  Ophthalmologist: routinely  Exercise regimen: no routine exercise at this time, working with OT Diet: low sodium  Functional Status of ADLs: Toileting- independently   Bathing- max assist Dressing-max assist   Reports recent visit with family and the children had colds, now pt with increased cough in the morning with congestion, cough does bother her some. No increased fluid, no fevers or chills. No shortness of breath noted  Review of Systems:  Review of Systems  Constitutional: Negative for fever, chills and diaphoresis.  HENT: Positive for hearing loss and postnasal drip. Negative for congestion, rhinorrhea and sore throat.   Respiratory: Positive for cough (in the morning, gets better throught the day. ). Negative for shortness of breath and wheezing.     Cardiovascular: Negative for chest pain, palpitations and leg swelling.       PSVT and syncopal episodes.  Gastrointestinal: Negative for nausea, diarrhea and constipation.  Musculoskeletal: Negative for myalgias, back pain and neck pain.  Skin: Negative for rash.  Neurological: Negative for tremors, seizures, weakness and headaches.  Hematological: Bruises/bleeds easily.    Past Medical History  Diagnosis Date  . SVT (supraventricular tachycardia)     Diagnosed 2010  . HTN (hypertension)   . Dyslipidemia   . CAD (coronary artery disease)     a. NSTEMI 2010 in setting of SVT, with cath with unsuccessful PCI - chronic total occlusion of LAD, R->L collaterals.  . GI bleeding   . Unspecified hypothyroidism   . Unspecified hereditary and idiopathic peripheral neuropathy   . Pneumonia, organism unspecified   . Basal cell carcinoma of skin of trunk, except scrotum   . Unspecified malignant neoplasm of skin of lower limb, including hip   . Basal cell carcinoma of skin of lower limb, including hip   . Unspecified glaucoma     left eye  . Other premature beats   . Other esophagitis   . Esophageal reflux   . Osteoarthrosis, unspecified whether generalized or localized, unspecified site     multiple joints  . Pain in joint, shoulder region   . Pain in joint, lower leg     knee  . Pain in joint, ankle and foot     right  . Sacroiliitis, not elsewhere classified   . Cervicalgia   . Lumbago   . Ganglion of tendon sheath   .  Pathologic fracture of vertebrae   . Tietze's disease   . Insomnia, unspecified   . Disturbance of skin sensation   . Edema   . Palpitations   . Other nonspecific abnormal serum enzyme levels   . Nasal bones, closed fracture   . Closed fracture of unspecified trochanteric section of femur   . Open wound of knee, leg (except thigh), and ankle, without mention of complication   . Hip, thigh, leg, and ankle, abrasion or friction burn, without mention of  infection   . Contusion of hip   . Fall from other slipping, tripping, or stumbling   . Personal history of fall   . Dyspepsia and other specified disorders of function of stomach   . Paroxysmal supraventricular tachycardia   . Acute bronchiolitis due to other infectious organisms 03/02/2013  . Other abnormal blood chemistry 03/15/2013  . Contusion of rib on left side 08/03/2013  . Hypertension   . Coronary artery disease   . Glaucoma    Past Surgical History  Procedure Laterality Date  . Total hip arthroplasty Left 2001  . Breast lumpectomy Right 1995     negative for malignancy  . Cataract extraction w/ intraocular lens  implant, bilateral Bilateral 2003  . Leg skin lesion  biopsy / excision Left 08/22/2010    Lower leg shave biopsy -solar keratosis (Dr. Radford Pax)  . Joint replacement    . Eye surgery    . Pars plana vitrectomy Right 04/25/2015    Procedure: PARS PLANA VITRECTOMY WITH 25G REMOVAL/SUTURE INTRAOCULAR LENS; HEADSCOPE LASER;GAS FLUID EXCHANGE;  Surgeon: Hayden Pedro, MD;  Location: [place];  Service: Ophthalmology;  Laterality: Right;   Social History:   reports that she has never smoked. She has never used smokeless tobacco. She reports that she does not drink alcohol or use illicit drugs.  Family History  Problem Relation Age of Onset  . Stroke Neg Hx   . Heart attack Neg Hx   . Heart disease Father   . Cancer Brother     lung    Medications: Patient's Medications  New Prescriptions   No medications on file  Previous Medications   ACETAMINOPHEN (TYLENOL) 500 MG TABLET    Take 500 mg by mouth 2 (two) times daily. Take two tablets twice a day   AMOXICILLIN (AMOXIL) 500 MG TABLET    Take 500 mg by mouth as needed (Dental appointment). Take 4 capsules one hour prior to dental appointment   ASPIRIN EC 81 MG TABLET    Take 81 mg by mouth every evening.   ATORVASTATIN (LIPITOR) 20 MG TABLET    TAKE 1 TABLET BY MOUTH DAILY TO LOWER LIPIDS   BACITRACIN-POLYMYXIN B  (POLYSPORIN) OPHTHALMIC OINTMENT    Place 1 application into the right eye 3 (three) times daily. apply to eye every 8 hours while awake   BRINZOLAMIDE-BRIMONIDINE (SIMBRINZA) 1-0.2 % SUSP    Apply to eye. One drop in both eyes twice daily   EZETIMIBE (ZETIA) 10 MG TABLET    Take 10 mg by mouth daily.   FUROSEMIDE (LASIX) 20 MG TABLET    Take 20 mg by mouth daily.   HYDROCODONE-ACETAMINOPHEN (NORCO) 5-325 MG PER TABLET    Take 1 tablet by mouth every 6 (six) hours as needed for severe pain.   IMIPRAMINE (TOFRANIL) 50 MG TABLET    Take 50 mg by mouth at bedtime.   LATANOPROST (XALATAN) 0.005 % OPHTHALMIC SOLUTION    Place 1 drop into both eyes  every evening. UAD   LEVOTHYROXINE (SYNTHROID, LEVOTHROID) 50 MCG TABLET    TAKE 1 TABLET BY MOUTH ONCE DAILY   LISINOPRIL (PRINIVIL,ZESTRIL) 10 MG TABLET    Take 1 tablet (10 mg total) by mouth 2 (two) times daily.   LORATADINE (CLARITIN) 10 MG TABLET    Take 10 mg by mouth daily.   MELATONIN 5 MG TABS    Take 1 tablet by mouth at bedtime and may repeat dose one time if needed. Take one tablet at bedtime as needed for insomnia   MULTIPLE VITAMIN (MULTIVITAMIN WITH MINERALS) TABS    Take 1 tablet by mouth daily. Certavite-A   MULTIPLE VITAMINS-MINERALS (PRESERVISION/LUTEIN) CAPS    Take by mouth. Take one twice daily for eyes   OMEPRAZOLE (PRILOSEC) 20 MG CAPSULE    Take 40 mg by mouth daily.    PINDOLOL (VISKEN) 5 MG TABLET    Take 2.5 mg by mouth daily.   PREDNISOLONE ACETATE (PRED FORTE) 1 % OPHTHALMIC SUSPENSION    Place 1 drop into the right eye 4 (four) times daily.   SALINE NASAL SPRAY NA    Place into the nose. One spray nasal every 2 hours up to 24 hours as needed   TROLAMINE SALICYLATE (ASPERCREME) 10 % CREAM    Apply 1 application topically as needed for muscle pain.  Modified Medications   No medications on file  Discontinued Medications   GATIFLOXACIN (ZYMAXID) 0.5 % SOLN    Place 1 drop into the right eye 4 (four) times daily.     Physical  Exam: Filed Vitals:   05/17/15 1447  BP: 112/60  Pulse: 56  Temp: 97.5 F (36.4 C)  TempSrc: Oral  Height: 5' 6.5" (1.689 m)  Weight: 143 lb (64.864 kg)  SpO2: 99%    Physical Exam  Constitutional: She appears well-developed.  HENT:  Head: Normocephalic and atraumatic.  Right Ear: External ear normal.  Left Ear: External ear normal.  Mouth/Throat: Oropharynx is clear and moist. No oropharyngeal exudate.  Bilateral hearing loss  Eyes: Conjunctivae and EOM are normal. Pupils are equal, round, and reactive to light.  Neck: Normal range of motion. Neck supple. No JVD present. No thyromegaly present.  Cardiovascular: Normal rate, regular rhythm, normal heart sounds and intact distal pulses.   Pulmonary/Chest: Effort normal and breath sounds normal.  Abdominal: Soft. Bowel sounds are normal. She exhibits no distension. There is no tenderness.  Musculoskeletal: Normal range of motion. She exhibits no edema.  Unstable gait.  Lymphadenopathy:    She has no cervical adenopathy.  Neurological: She is alert. No cranial nerve deficit.  Memory loss.  Skin: Skin is warm and dry.  Psychiatric: She has a normal mood and affect.    Labs reviewed: Basic Metabolic Panel:  Recent Labs  10/08/14 0325 10/14/14 1138 10/27/14 03/23/15 04/18/15 1141  NA 137 136 138 139 139  K 4.8 4.7 4.0 4.0 4.1  CL 102 102  --   --  106  CO2 24 26  --   --  27  GLUCOSE 87 99  --   --  126*  BUN 20 15 21  24* 24*  CREATININE 1.08 0.95 1.0 0.9 1.00  CALCIUM 8.6 9.1  --   --  8.7   Liver Function Tests:  Recent Labs  10/06/14 1635 03/23/15  AST 34 17  ALT 29 14  ALKPHOS 137* 157*  BILITOT 0.6  --   PROT 7.7  --   ALBUMIN 3.2*  --  No results for input(s): LIPASE, AMYLASE in the last 8760 hours. No results for input(s): AMMONIA in the last 8760 hours. CBC:  Recent Labs  09/02/14 1136 09/08/14 1230 10/06/14 1635 03/23/15 04/18/15 1141  WBC 6.4 10.4 7.8 8.9 7.3  NEUTROABS 4.3 7.6 5.0  --    --   HGB 14.6 14.0 13.2 14.0 12.4  HCT 43.0 42.4 40.2 41 39.4  MCV 97.7 98.6 98.0  --  103.4*  PLT 173 182 232 199 197   TSH:  Recent Labs  05/24/14 1003 03/23/15  TSH 4.310 1.56   A1C: Lab Results  Component Value Date   HGBA1C 5.7 12/21/2013   Lipid Panel: No results for input(s): CHOL, HDL, LDLCALC, TRIG, CHOLHDL, LDLDIRECT in the last 8760 hours.  Assessment/Plan  1. Preventive measure -doing well in AL. Currently has sitters around the clock, goal is not have sitters at night, coaching pt to use walker/assist devices to minimize falls. Working with OT to help ROM in right shoulder.  - DNR (Do Not Resuscitate)  2. Urinary retention resolved  3. Chronic diastolic heart failure - has remained stable, cont current regimen without changes, labs stable from march  4. Hypothyroidism, unspecified hypothyroidism type TSH stable at 1.56 in march   5. Essential hypertension -well controlled today however is variable, will cont current regimen.   6. Tachycardia-bradycardia syndrome -HR in the 50s which has been stable. Did not wish to have a pacemaker when offered. conts with cardiology follow up  7. Atherosclerosis of native coronary artery of native heart without angina pectoris Stable at this time, no acute chest pains or discomforts, conts ASA  8. Memory Loss  MMSE of 22/30 in Feb 2016, currently has 24 hour assistance for safety due to gait instability and forgetting to use assistive devices.  -doing well in AL where they manage her medications, appt and provide support with ADls.   9. Cough Without increased shortness of breath, fever or chills -will start mucinex DM PO BID for 1 week to help with cough and congestion, staff to monitor for worsening symptoms

## 2015-05-23 ENCOUNTER — Encounter (INDEPENDENT_AMBULATORY_CARE_PROVIDER_SITE_OTHER): Payer: Medicare Other | Admitting: Ophthalmology

## 2015-05-23 DIAGNOSIS — H2701 Aphakia, right eye: Secondary | ICD-10-CM

## 2015-05-24 ENCOUNTER — Encounter: Payer: Self-pay | Admitting: Nurse Practitioner

## 2015-08-07 ENCOUNTER — Encounter: Payer: Self-pay | Admitting: Adult Health

## 2015-08-07 ENCOUNTER — Non-Acute Institutional Stay: Payer: Medicare Other | Admitting: Adult Health

## 2015-08-07 DIAGNOSIS — I5033 Acute on chronic diastolic (congestive) heart failure: Secondary | ICD-10-CM | POA: Diagnosis not present

## 2015-08-07 DIAGNOSIS — I4891 Unspecified atrial fibrillation: Secondary | ICD-10-CM

## 2015-08-07 DIAGNOSIS — E039 Hypothyroidism, unspecified: Secondary | ICD-10-CM | POA: Diagnosis not present

## 2015-08-07 LAB — BASIC METABOLIC PANEL
BUN: 29 mg/dL — AB (ref 4–21)
Creatinine: 1.1 mg/dL (ref 0.5–1.1)
GLUCOSE: 137 mg/dL
Potassium: 3.9 mmol/L (ref 3.4–5.3)
Sodium: 134 mmol/L — AB (ref 137–147)

## 2015-08-07 NOTE — Progress Notes (Signed)
[name], female   DOB: 1920/07/16, 81 y.o.   MRN: [id number]     Patient Care Team: Gayland Curry, DO as PCP - General (Geriatric Medicine) Imogene Burn, PA-C as Physician Assistant (Cardiology) Mardene Celeste, NP as Nurse Practitioner (Geriatric Medicine) [facility] Minus Breeding, MD as Consulting Physician (Cardiology) Estill Dooms, MD (Internal Medicine)  Nursing Home Location:  [place]   Code Status: DNR   Place of Service: ALF [phone])  Chief Complaint  Patient presents with  . Acute Visit    SOB    HPI:  81 y.o. female residing at [facility], assisted living. I was asked to see her today due to SOB noted with any activity and some times at rest, present for 4 days. She has a hx of diastolic CHF with an EF of 50-55%, SVT, aortic aneurysm, CAD, MI, hypothyroidism, tachy-brady syndrome, LBBB, alzheimer's dementia, and dyslipidemia.   She previous lived independently but moved to [place] in March due to frequent falls and progression in dementia, and has fell, as recently as two weeks ago.   Her caregiver reports that she has been very short of breath and is requiring more assistance, a WC for long distances, and has had a cough with some noted blood in the tissue (in the trash, not observed).  The resident reports that she had some pain under left breast earlier in the week but it has subsided. Her EKG showed no acute ischemia but LBBB and new onset afib with a rate in the 70's.   She currently denies any angina. Her main concern is that she has not slept well for several nights and need 2 pillows under hear head to sleep.  She has not had a fever or purulent sputum.   A CXR was performed on showing the following:  CXR(08/03/15) mild diffuse pulmonary vascular congestion with linear interstitial edema, small bilat pleural effusions, no focal alveolar consolidation, findings consistent with CHF  without focal consolidation.  The oncall provider for [facility] increased the lasix to 40 mg BID for two days, previously was 20 mg daily. Her weight has decreased by 1 lb to 157lb with some improvement in SOB.  Prior to the lasix it appears that she has gained 9 lbs but there are some questionable variability in the weights. Her K was 3.9 after admin but Na did drop to 134.   Review of Systems:  Review of Systems  Constitutional: Positive for activity change, appetite change, fatigue and unexpected weight change. Negative for fever, chills and diaphoresis.  HENT: Negative for congestion and rhinorrhea.   Respiratory: Positive for cough and shortness of breath. Negative for wheezing and stridor.   Cardiovascular: Positive for leg swelling. Negative for chest pain and palpitations.  Gastrointestinal: Negative for abdominal pain, constipation, blood in stool and abdominal distention.  Genitourinary: Negative for dysuria and difficulty urinating.  Neurological: Negative for dizziness, syncope, speech difficulty, weakness and light-headedness.  Psychiatric/Behavioral: Positive for confusion. Negative for behavioral problems and agitation.    Medications: Patient's Medications  New Prescriptions   No medications on file  Previous Medications   ACETAMINOPHEN (TYLENOL) 500 MG TABLET    Take 500 mg by mouth 2 (two) times daily. Take two tablets twice a day   AMOXICILLIN (AMOXIL) 500 MG TABLET    Take 500 mg by mouth as needed (Dental appointment). Take 4 capsules one hour prior to dental appointment   ASPIRIN EC 81 MG  TABLET    Take 81 mg by mouth every evening.   ATORVASTATIN (LIPITOR) 20 MG TABLET    TAKE 1 TABLET BY MOUTH DAILY TO LOWER LIPIDS   BACITRACIN-POLYMYXIN B (POLYSPORIN) OPHTHALMIC OINTMENT    Place 1 application into the right eye 3 (three) times daily. apply to eye every 8 hours while awake   BRINZOLAMIDE-BRIMONIDINE (SIMBRINZA) 1-0.2 % SUSP    Apply to eye. One drop in both eyes twice  daily   EZETIMIBE (ZETIA) 10 MG TABLET    Take 10 mg by mouth daily.   FLUTICASONE (VERAMYST) 27.5 MCG/SPRAY NASAL SPRAY    Place 1 spray into the nose 2 (two) times daily.   FUROSEMIDE (LASIX) 20 MG TABLET    Take 20 mg by mouth daily.   HYDROCODONE-ACETAMINOPHEN (NORCO) 5-325 MG PER TABLET    Take 1 tablet by mouth every 6 (six) hours as needed for severe pain.   IMIPRAMINE (TOFRANIL) 50 MG TABLET    Take 50 mg by mouth at bedtime.   LATANOPROST (XALATAN) 0.005 % OPHTHALMIC SOLUTION    Place 1 drop into both eyes every evening. UAD   LEVOTHYROXINE (SYNTHROID, LEVOTHROID) 50 MCG TABLET    TAKE 1 TABLET BY MOUTH ONCE DAILY   LISINOPRIL (PRINIVIL,ZESTRIL) 10 MG TABLET    Take 1 tablet (10 mg total) by mouth 2 (two) times daily.   LORATADINE (CLARITIN) 10 MG TABLET    Take 10 mg by mouth daily.   MELATONIN 5 MG TABS    Take 1 tablet by mouth at bedtime and may repeat dose one time if needed. Take one tablet at bedtime as needed for insomnia   MULTIPLE VITAMIN (MULTIVITAMIN WITH MINERALS) TABS    Take 1 tablet by mouth daily. Certavite-A   MULTIPLE VITAMINS-MINERALS (PRESERVISION/LUTEIN) CAPS    Take by mouth. Take one twice daily for eyes   OMEPRAZOLE (PRILOSEC) 20 MG CAPSULE    Take 40 mg by mouth daily.    PINDOLOL (VISKEN) 5 MG TABLET    Take 2.5 mg by mouth daily.   PREDNISOLONE ACETATE (PRED FORTE) 1 % OPHTHALMIC SUSPENSION    Place 1 drop into the right eye 4 (four) times daily.   SALINE NASAL SPRAY NA    Place into the nose. One spray nasal every 2 hours up to 24 hours as needed   TROLAMINE SALICYLATE (ASPERCREME) 10 % CREAM    Apply 1 application topically as needed for muscle pain.  Modified Medications   No medications on file  Discontinued Medications   No medications on file     Physical Exam:  Filed Vitals:   08/07/15 1024  BP: 110/78  Pulse: 78  Temp: 97.3 F (36.3 C)  Resp: 20  Weight: 157 lb (71.215 kg)  SpO2: 94%    Physical Exam  HENT:  Head: Normocephalic and  atraumatic.  Nose: Nose normal.  Mouth/Throat: Oropharynx is clear and moist. No oropharyngeal exudate.  Eyes: Pupils are equal, round, and reactive to light.  Neck: Normal range of motion. Neck supple. No thyromegaly present.  Cardiovascular: Exam reveals no gallop and no friction rub.   No murmur heard. Distant sounds, irregular, +1 BLE edema   Pulmonary/Chest:  Slight increase WOB with mild crackles to right base but otherwise clear  Abdominal: Soft. Bowel sounds are normal. She exhibits no distension. There is no tenderness.  Neurological: She is alert. No cranial nerve deficit.  Oriented to self and situation, able to answer q's and follow commands  Skin: She is not diaphoretic.  bruise to the right side of her neck (just found per caregiver today, unclear etiology)  Psychiatric: Affect normal.    Labs reviewed/Significant Diagnostic Results:  Basic Metabolic Panel:  Recent Labs  10/08/14 0325 10/14/14 1138  03/23/15 04/18/15 1141 08/07/15  NA 137 136  < > 139 139 134*  K 4.8 4.7  < > 4.0 4.1 3.9  CL 102 102  --   --  106  --   CO2 24 26  --   --  27  --   GLUCOSE 87 99  --   --  126*  --   BUN 20 15  < > 24* 24* 29*  CREATININE 1.08 0.95  < > 0.9 1.00 1.1  CALCIUM 8.6 9.1  --   --  8.7  --   < > = values in this interval not displayed. Liver Function Tests:  Recent Labs  10/06/14 1635 03/23/15  AST 34 17  ALT 29 14  ALKPHOS 137* 157*  BILITOT 0.6  --   PROT 7.7  --   ALBUMIN 3.2*  --    No results for input(s): LIPASE, AMYLASE in the last 8760 hours. No results for input(s): AMMONIA in the last 8760 hours. CBC:  Recent Labs  09/02/14 1136 09/08/14 1230 10/06/14 1635 03/23/15 04/18/15 1141  WBC 6.4 10.4 7.8 8.9 7.3  NEUTROABS 4.3 7.6 5.0  --   --   HGB 14.6 14.0 13.2 14.0 12.4  HCT 43.0 42.4 40.2 41 39.4  MCV 97.7 98.6 98.0  --  103.4*  PLT 173 182 232 199 197   CBG: No results for input(s): GLUCAP in the last 8760 hours. TSH:  Recent Labs   03/23/15  TSH 1.56   A1C: Lab Results  Component Value Date   HGBA1C 5.7 12/21/2013   Lipid Panel: No results for input(s): CHOL, HDL, LDLCALC, TRIG, CHOLHDL, LDLDIRECT in the last 8760 hours.  CXR(08/03/15) mild diffuse pulmonary vascular congestion with linear interstitial edema, small bilat pleural effusions, no focal alveolar consolidation, findings consistent with CHF without focal consolidation   Assessment/Plan  1) Acute on chronic CHF  Labs/tests ordered 12 lead ekg BMP on 8/10 Add CBC and TSH to todays labs  -weights daily, compression hose -Lasix 40 mg BID with Kdur 20 meq BID for 5 days -VS monitoring BID -f/u with Dr. Percival Spanish   2) new onset Afib -not previously noted in records but noted today on EKG - due to frequent falls would not start anticoagulation and await cardiology input -rate is controlled on pindolol, also on ASA  3) Hypothyroidism -check TSH   Cindi Carbon, [place] [phone]

## 2015-08-11 ENCOUNTER — Encounter: Payer: Self-pay | Admitting: Cardiology

## 2015-08-11 ENCOUNTER — Ambulatory Visit (INDEPENDENT_AMBULATORY_CARE_PROVIDER_SITE_OTHER): Payer: Medicare Other | Admitting: Cardiology

## 2015-08-11 VITALS — BP 110/72 | HR 80 | Ht 65.0 in | Wt 157.8 lb

## 2015-08-11 DIAGNOSIS — I2510 Atherosclerotic heart disease of native coronary artery without angina pectoris: Secondary | ICD-10-CM | POA: Diagnosis not present

## 2015-08-11 DIAGNOSIS — R0600 Dyspnea, unspecified: Secondary | ICD-10-CM | POA: Diagnosis not present

## 2015-08-11 NOTE — Patient Instructions (Signed)
Your physician recommends that you schedule a follow-up appointment in: 1 Month with Dr Percival Spanish or APP  Your physician has recommended you make the following change in your medication: Continue taking Furosemide 40 mg twice a day.

## 2015-08-11 NOTE — Progress Notes (Signed)
HPI The patient has a history of supraventricular tachycardia and presents today after having acute SOB over the past several days.   She lives in assisted living. She washed very closely. She thinks she is gaining about 11 pounds in less than a month.  She has had increased shortness of breath that was relatively abrupt. She's had increased restlessness at night. She was found on EKG at the nursing home to have new onset atrial fibrillation but with rate control.   She was treated with oral diuresis.   She has had about 3 or 4 pounds of weight loss with this. However, her breathing is not quite at baseline. She has some mild increased lower extremity swelling. She is watching her salt intake. She does have some limited fluid intake. She's not noticing any palpitations, presyncope or syncope. She's not having any chest pressure, neck or arm discomfort. She has had falls and is unsteady on her feet. She walks with a walker or uses a wheelchair.  No Known Allergies  Current Outpatient Prescriptions  Medication Sig Dispense Refill  . furosemide (LASIX) 20 MG tablet Take 40 mg by mouth 2 (two) times daily.     Marland Kitchen acetaminophen (TYLENOL) 500 MG tablet Take 500 mg by mouth 2 (two) times daily. Take two tablets twice a day    . amoxicillin (AMOXIL) 500 MG tablet Take 500 mg by mouth as needed (Dental appointment). Take 4 capsules one hour prior to dental appointment    . aspirin EC 81 MG tablet Take 81 mg by mouth every evening.    Marland Kitchen atorvastatin (LIPITOR) 20 MG tablet TAKE 1 TABLET BY MOUTH DAILY TO LOWER LIPIDS 90 tablet 0  . bacitracin-polymyxin b (POLYSPORIN) ophthalmic ointment Place 1 application into the right eye 3 (three) times daily. apply to eye every 8 hours while awake 3.5 g 0  . Brinzolamide-Brimonidine (SIMBRINZA) 1-0.2 % SUSP Apply to eye. One drop in both eyes twice daily    . ezetimibe (ZETIA) 10 MG tablet Take 10 mg by mouth daily.    . fluticasone (VERAMYST) 27.5 MCG/SPRAY nasal spray  Place 1 spray into the nose 2 (two) times daily.    Marland Kitchen HYDROcodone-acetaminophen (NORCO) 5-325 MG per tablet Take 1 tablet by mouth every 6 (six) hours as needed for severe pain. 15 tablet 0  . imipramine (TOFRANIL) 50 MG tablet Take 50 mg by mouth at bedtime.    Marland Kitchen latanoprost (XALATAN) 0.005 % ophthalmic solution Place 1 drop into both eyes every evening. UAD    . levothyroxine (SYNTHROID, LEVOTHROID) 50 MCG tablet TAKE 1 TABLET BY MOUTH ONCE DAILY 90 tablet 0  . lisinopril (PRINIVIL,ZESTRIL) 10 MG tablet Take 1 tablet (10 mg total) by mouth 2 (two) times daily. 180 tablet 3  . loratadine (CLARITIN) 10 MG tablet Take 10 mg by mouth daily.    . Melatonin 5 MG TABS Take 1 tablet by mouth at bedtime and may repeat dose one time if needed. Take one tablet at bedtime as needed for insomnia    . Multiple Vitamin (MULTIVITAMIN WITH MINERALS) TABS Take 1 tablet by mouth daily. Certavite-A    . Multiple Vitamins-Minerals (PRESERVISION/LUTEIN) CAPS Take by mouth. Take one twice daily for eyes    . omeprazole (PRILOSEC) 20 MG capsule Take 40 mg by mouth daily.     . pindolol (VISKEN) 5 MG tablet Take 2.5 mg by mouth daily.    . prednisoLONE acetate (PRED FORTE) 1 % ophthalmic suspension Place 1 drop into  the right eye 4 (four) times daily. 5 mL 0  . SALINE NASAL SPRAY NA Place into the nose. One spray nasal every 2 hours up to 24 hours as needed    . trolamine salicylate (ASPERCREME) 10 % cream Apply 1 application topically as needed for muscle pain.     No current facility-administered medications for this visit.    Past Medical History  Diagnosis Date  . SVT (supraventricular tachycardia)     Diagnosed 2010  . HTN (hypertension)   . Dyslipidemia   . CAD (coronary artery disease)     a. NSTEMI 2010 in setting of SVT, with cath with unsuccessful PCI - chronic total occlusion of LAD, R->L collaterals.  . GI bleeding   . Unspecified hypothyroidism   . Unspecified hereditary and idiopathic peripheral  neuropathy   . Pneumonia, organism unspecified   . Basal cell carcinoma of skin of trunk, except scrotum   . Unspecified malignant neoplasm of skin of lower limb, including hip   . Basal cell carcinoma of skin of lower limb, including hip   . Unspecified glaucoma     left eye  . Other premature beats   . Other esophagitis   . Esophageal reflux   . Osteoarthrosis, unspecified whether generalized or localized, unspecified site     multiple joints  . Pain in joint, shoulder region   . Pain in joint, lower leg     knee  . Pain in joint, ankle and foot     right  . Sacroiliitis, not elsewhere classified   . Cervicalgia   . Lumbago   . Ganglion of tendon sheath   . Pathologic fracture of vertebrae   . Tietze's disease   . Insomnia, unspecified   . Disturbance of skin sensation   . Edema   . Palpitations   . Other nonspecific abnormal serum enzyme levels   . Nasal bones, closed fracture   . Closed fracture of unspecified trochanteric section of femur   . Open wound of knee, leg (except thigh), and ankle, without mention of complication   . Hip, thigh, leg, and ankle, abrasion or friction burn, without mention of infection   . Contusion of hip   . Fall from other slipping, tripping, or stumbling   . Personal history of fall   . Dyspepsia and other specified disorders of function of stomach   . Paroxysmal supraventricular tachycardia   . Acute bronchiolitis due to other infectious organisms 03/02/2013  . Other abnormal blood chemistry 03/15/2013  . Contusion of rib on left side 08/03/2013  . Hypertension   . Coronary artery disease   . Glaucoma     ROS:  As stated in the HPI and negative for all other systems.  PHYSICAL EXAM BP 110/72 mmHg  Pulse 80  Ht 5\' 5"  (1.651 m)  Wt 157 lb 12.8 oz (71.578 kg)  BMI 26.26 kg/m2 GEN:  No distress, frail appearing  NECK:  No jugular venous distention at 90 degrees, waveform within normal limits, carotid upstroke brisk and symmetric, no  bruits, no thyromegaly LYMPHATICS:  No cervical adenopathy LUNGS:  Clear to auscultation bilaterally BACK:  No CVA tenderness CHEST:  Unremarkable HEART:  S1 and S2 within normal limits, no S3, no S4, no clicks, no rubs, soft apical systolic murmur, no diastolic murmurs ABD:  Positive bowel sounds normal in frequency in pitch, no bruits, no rebound, no guarding, unable to assess midline mass or bruit with the patient seated. EXT:  2 plus  pulses throughout, moderate edema, no cyanosis no clubbing SKIN:  No rashes no nodules, bruising and wounds.     ASSESSMENT AND PLAN  ATRIAL FIB - She she has fibrillation that is new. She would be too high risk for anticoagulation. She seems to have good rate control. No change in therapy is planned. This certainly could have contributed to her acute decompensation.  ACUTE DIASTOLIC HF -  I suspect this is related to the atrial fibrillation. Going to pursue continue aggressive medical management. I'm going to renew her Lasix 40 mg twice daily. She had a basic metabolic profile today probably need one in a few days but we will order this. We talked about keeping her feet elevated. She'll continue with daily weights.  PSVT -  She wants conservative therapy.  She has not had symptomatic recurrence of this.  HYPERTENSION -  The blood pressure is at target. No change in medications is indicated. We will continue with therapeutic lifestyle changes (TLC).  CAD - She has had no active ischemia. No change in therapy is indicated other than above.

## 2015-08-14 ENCOUNTER — Encounter: Payer: Self-pay | Admitting: Cardiology

## 2015-08-14 ENCOUNTER — Encounter (INDEPENDENT_AMBULATORY_CARE_PROVIDER_SITE_OTHER): Payer: Medicare Other | Admitting: Ophthalmology

## 2015-08-15 ENCOUNTER — Telehealth: Payer: Self-pay | Admitting: Cardiology

## 2015-08-15 NOTE — Telephone Encounter (Signed)
Spoke with patient's daughter and notified her of results. Called [facility] and notified nursing staff of results. They are going to draw BMET in 1 week and send results to Dr. Percival Spanish

## 2015-08-15 NOTE — Telephone Encounter (Signed)
Returning your,she thinks it is concerning her lab results.

## 2015-08-16 ENCOUNTER — Encounter: Payer: Self-pay | Admitting: Internal Medicine

## 2015-08-16 ENCOUNTER — Non-Acute Institutional Stay: Payer: Medicare Other | Admitting: Internal Medicine

## 2015-08-16 VITALS — BP 118/62 | HR 76 | Temp 97.4°F | Wt 153.0 lb

## 2015-08-16 DIAGNOSIS — I4891 Unspecified atrial fibrillation: Secondary | ICD-10-CM | POA: Diagnosis not present

## 2015-08-16 DIAGNOSIS — R0981 Nasal congestion: Secondary | ICD-10-CM | POA: Diagnosis not present

## 2015-08-16 DIAGNOSIS — Z780 Asymptomatic menopausal state: Secondary | ICD-10-CM | POA: Diagnosis not present

## 2015-08-16 DIAGNOSIS — I2510 Atherosclerotic heart disease of native coronary artery without angina pectoris: Secondary | ICD-10-CM | POA: Diagnosis not present

## 2015-08-16 DIAGNOSIS — R351 Nocturia: Secondary | ICD-10-CM

## 2015-08-16 DIAGNOSIS — I5033 Acute on chronic diastolic (congestive) heart failure: Secondary | ICD-10-CM

## 2015-08-16 NOTE — Progress Notes (Signed)
[name], female   DOB: [birth year], 81 y.o.   MRN: [id number]   Location:  [facility]  Code Status: DNR  Goals of Care:Advanced Directive information Does patient have an advance directive?: Yes, Type of Advance Directive: [place];Out of facility DNR (pink MOST or yellow form), Pre-existing out of facility DNR order (yellow form or pink MOST form): Yellow form placed in chart (order not valid for inpatient use);Pink MOST form placed in chart (order not valid for inpatient use)  Chief Complaint  Patient presents with  . Medical Management of Chronic Issues    blood pressure, thyroid, edema, memory    HPI: Patient is a 81 y.o. white female seen in the [facility] clinic today for med mgt chronic diseases.  She lives in [place] and is accompanied by her caregiver today.  Fell 3 wks ago.  Tripped over someone else's walker trying to get past.  Previously had last fallen in February.   Has been raising cane about her different nose drops.  Wants neosynephrine for the congestion.  Says she cannot sleep b/c her congestion--left nostril congested.  Was supposed to see ENT last year but never got there b/c of her afib and other problems that arose.  Had nasal fracture.    Echo 10/15 reviewed:   [id number] Mild as Trivial pericardial effusion Left pleural effusion  Acute diastolic chf per 8/34 visit with Dr. Ned Grace on lasix 40mg  twice daily.  Has to get up to urinate at night.  Does not sleep well for several weeks now.  Has lost weight and breathing better.   Review of Systems:  Review of Systems  Constitutional: Positive for weight loss. Negative for fever and chills.  HENT: Positive for congestion and hearing loss. Negative for sore throat.   Eyes: Negative for blurred vision.       Glasses  Respiratory: Negative for cough and shortness of breath.   Cardiovascular: Positive for palpitations. Negative for chest pain and leg swelling.    Afib  Gastrointestinal: Negative for abdominal pain.  Genitourinary: Positive for frequency. Negative for dysuria and urgency.       Nocturia  Musculoskeletal: Positive for falls.  Skin:       Scars from multiple skin cancer removals  Neurological: Negative for dizziness and headaches.  Endo/Heme/Allergies: Bruises/bleeds easily.  Psychiatric/Behavioral: Positive for memory loss.    Past Medical History  Diagnosis Date  . SVT (supraventricular tachycardia)     Diagnosed 2010  . HTN (hypertension)   . Dyslipidemia   . CAD (coronary artery disease)     a. NSTEMI 2010 in setting of SVT, with cath with unsuccessful PCI - chronic total occlusion of LAD, R->L collaterals.  . GI bleeding   . Unspecified hypothyroidism   . Unspecified hereditary and idiopathic peripheral neuropathy   . Pneumonia, organism unspecified   . Basal cell carcinoma of skin of trunk, except scrotum   . Unspecified malignant neoplasm of skin of lower limb, including hip   . Basal cell carcinoma of skin of lower limb, including hip   . Unspecified glaucoma     left eye  . Other premature beats   . Other esophagitis   . Esophageal reflux   . Osteoarthrosis, unspecified whether generalized or localized, unspecified site     multiple joints  . Pain in joint, shoulder region   . Pain in joint, lower leg     knee  . Pain in joint, ankle and  foot     right  . Sacroiliitis, not elsewhere classified   . Cervicalgia   . Lumbago   . Ganglion of tendon sheath   . Pathologic fracture of vertebrae   . Tietze's disease   . Insomnia, unspecified   . Disturbance of skin sensation   . Edema   . Palpitations   . Other nonspecific abnormal serum enzyme levels   . Nasal bones, closed fracture   . Closed fracture of unspecified trochanteric section of femur   . Open wound of knee, leg (except thigh), and ankle, without mention of complication   . Hip, thigh, leg, and ankle, abrasion or friction burn, without mention  of infection   . Contusion of hip   . Fall from other slipping, tripping, or stumbling   . Personal history of fall   . Dyspepsia and other specified disorders of function of stomach   . Paroxysmal supraventricular tachycardia   . Acute bronchiolitis due to other infectious organisms 03/02/2013  . Other abnormal blood chemistry 03/15/2013  . Contusion of rib on left side 08/03/2013  . Hypertension   . Coronary artery disease   . Glaucoma     Past Surgical History  Procedure Laterality Date  . Total hip arthroplasty Left 2001  . Breast lumpectomy Right 1995     negative for malignancy  . Cataract extraction w/ intraocular lens  implant, bilateral Bilateral 2003  . Leg skin lesion  biopsy / excision Left 08/22/2010    Lower leg shave biopsy -solar keratosis (Dr. Radford Pax)  . Joint replacement    . Eye surgery    . Pars plana vitrectomy Right 04/25/2015    Procedure: PARS PLANA VITRECTOMY WITH 25G REMOVAL/SUTURE INTRAOCULAR LENS; HEADSCOPE LASER;GAS FLUID EXCHANGE;  Surgeon: Hayden Pedro, MD;  Location: [facility];  Service: Ophthalmology;  Laterality: Right;    Social History:   reports that she has never smoked. She has never used smokeless tobacco. She reports that she does not drink alcohol or use illicit drugs.  No Known Allergies  Medications: Patient's Medications  New Prescriptions   No medications on file  Previous Medications   ACETAMINOPHEN (TYLENOL) 500 MG TABLET    Take 500 mg by mouth 2 (two) times daily. Take two tablets twice a day   AMOXICILLIN (AMOXIL) 500 MG TABLET    Take 500 mg by mouth as needed (Dental appointment). Take 4 capsules one hour prior to dental appointment   ASPIRIN EC 81 MG TABLET    Take 81 mg by mouth every evening.   ATORVASTATIN (LIPITOR) 20 MG TABLET    TAKE 1 TABLET BY MOUTH DAILY TO LOWER LIPIDS   BRINZOLAMIDE-BRIMONIDINE (SIMBRINZA) 1-0.2 % SUSP    Apply to eye. One drop in both eyes twice daily   EZETIMIBE (ZETIA) 10 MG TABLET    Take 10  mg by mouth daily.   FLUTICASONE (VERAMYST) 27.5 MCG/SPRAY NASAL SPRAY    Place 1 spray into the nose 2 (two) times daily.   FUROSEMIDE (LASIX) 20 MG TABLET    Take 40 mg by mouth. Take one tablet in morning, take 40 mg once a day   HYDROCODONE-ACETAMINOPHEN (NORCO) 5-325 MG PER TABLET    Take 1 tablet by mouth every 6 (six) hours as needed for severe pain.   IMIPRAMINE (TOFRANIL) 50 MG TABLET    Take 50 mg by mouth at bedtime.   LATANOPROST (XALATAN) 0.005 % OPHTHALMIC SOLUTION    Place 1 drop into both eyes every  evening. UAD   LEVOTHYROXINE (SYNTHROID, LEVOTHROID) 50 MCG TABLET    TAKE 1 TABLET BY MOUTH ONCE DAILY   LISINOPRIL (PRINIVIL,ZESTRIL) 10 MG TABLET    Take 1 tablet (10 mg total) by mouth 2 (two) times daily.   LORATADINE (CLARITIN) 10 MG TABLET    Take 10 mg by mouth daily.   MELATONIN 5 MG TABS    Take 1 tablet by mouth at bedtime and may repeat dose one time if needed. Take one tablet at bedtime as needed for insomnia   MULTIPLE VITAMIN (MULTIVITAMIN WITH MINERALS) TABS    Take 1 tablet by mouth daily. Certavite-A   MULTIPLE VITAMINS-MINERALS (PRESERVISION/LUTEIN) CAPS    Take by mouth. Take one twice daily for eyes   OMEPRAZOLE (PRILOSEC) 20 MG CAPSULE    Take 40 mg by mouth daily.    PINDOLOL (VISKEN) 5 MG TABLET    Take 2.5 mg by mouth daily.   POTASSIUM CHLORIDE ER 20 MEQ TBCR    Take by mouth. One twice daily   SALINE NASAL SPRAY NA    Place into the nose. One spray nasal every 2 hours up to 24 hours as needed   TROLAMINE SALICYLATE (ASPERCREME) 10 % CREAM    Apply 1 application topically as needed for muscle pain.  Modified Medications   No medications on file  Discontinued Medications   BACITRACIN-POLYMYXIN B (POLYSPORIN) OPHTHALMIC OINTMENT    Place 1 application into the right eye 3 (three) times daily. apply to eye every 8 hours while awake   PREDNISOLONE ACETATE (PRED FORTE) 1 % OPHTHALMIC SUSPENSION    Place 1 drop into the right eye 4 (four) times daily.      Physical Exam: Filed Vitals:   08/16/15 1105  BP: 118/62  Pulse: 76  Temp: 97.4 F (36.3 C)  TempSrc: Oral  Weight: 153 lb (69.4 kg)  SpO2: 96%   Body mass index is 25.46 kg/(m^2). Physical Exam  Constitutional:  Thin white female seated in transport wheelchair accompanied by her caregiver  Cardiovascular:  irreg irreg  Pulmonary/Chest: Effort normal and breath sounds normal. She has no rales.  Abdominal: Soft. Bowel sounds are normal. She exhibits no distension. There is no tenderness.  Musculoskeletal: Normal range of motion.  Neurological: She is alert.  Oriented to person and place, poor short term memory  Skin: Skin is warm and dry.  Scars from skin cancer removal  Psychiatric: She has a normal mood and affect.     Labs reviewed: Basic Metabolic Panel:  Recent Labs  10/08/14 0325 10/14/14 1138  03/23/15 04/18/15 1141 08/07/15  NA 137 136  < > 139 139 134*  K 4.8 4.7  < > 4.0 4.1 3.9  CL 102 102  --   --  106  --   CO2 24 26  --   --  27  --   GLUCOSE 87 99  --   --  126*  --   BUN 20 15  < > 24* 24* 29*  CREATININE 1.08 0.95  < > 0.9 1.00 1.1  CALCIUM 8.6 9.1  --   --  8.7  --   TSH  --   --   --  1.56  --   --   < > = values in this interval not displayed. Liver Function Tests:  Recent Labs  10/06/14 1635 03/23/15  AST 34 17  ALT 29 14  ALKPHOS 137* 157*  BILITOT 0.6  --   PROT 7.7  --  ALBUMIN 3.2*  --    No results for input(s): LIPASE, AMYLASE in the last 8760 hours. No results for input(s): AMMONIA in the last 8760 hours. CBC:  Recent Labs  09/02/14 1136 09/08/14 1230 10/06/14 1635 03/23/15 04/18/15 1141  WBC 6.4 10.4 7.8 8.9 7.3  NEUTROABS 4.3 7.6 5.0  --   --   HGB 14.6 14.0 13.2 14.0 12.4  HCT 43.0 42.4 40.2 41 39.4  MCV 97.7 98.6 98.0  --  103.4*  PLT 173 182 232 199 197   Lipid Panel: No results for input(s): CHOL, HDL, LDLCALC, TRIG, CHOLHDL, LDLDIRECT in the last 8760 hours. Lab Results  Component Value Date    HGBA1C 5.7 12/21/2013    Procedures since last appt:  Patient Care Team: Gayland Curry, DO as PCP - General (Geriatric Medicine) Imogene Burn, PA-C as Physician Assistant (Cardiology) Mardene Celeste, NP as Nurse Practitioner (Geriatric Medicine) [facility] Minus Breeding, MD as Consulting Physician (Cardiology) Estill Dooms, MD (Internal Medicine)  Assessment/Plan 1. Acute on chronic diastolic congestive heart failure -has improved since double lasix per cardiology -cont the same doses, just move time due to nocturia -f/u with Dr. Percival Spanish as planned  2. Atrial fibrillation, unspecified -cont baby asa, is paroxysmal, not on rate control med  3. Sinus congestion -ongoing--will permit use of neosynephrin x 3 days only to help with this -was meant to see ENT, but other problems developed so referral rewritten due to left nasal blockage with septal deviation from fall  4. Nocturia -worse with lasix bid -will move to 6:30 am and 12:30pm to help decrease hs frequency--hopefully this will still allow max receptor binding and efficacy  5. Postmenopausal estrogen deficiency -referral for Bone density -has hip replacement in past but I don't see evidence of prior fxs or bone density being done -with her falls, she needs this evaluated and will need ca with D and additional D supplement at next visit, too  Labs/tests ordered:  Bone density, ENT referral Next appt:  3 mos for med mgt  [name], D.O. [facility] [address], [place] [address] Cell Phone (Mon-Fri 8am-5pm):  [phone] On Call:  [phone] & follow prompts after 5pm & weekends Office Phone:  [phone] Office Fax:  [phone]

## 2015-08-21 ENCOUNTER — Encounter (INDEPENDENT_AMBULATORY_CARE_PROVIDER_SITE_OTHER): Payer: Medicare Other | Admitting: Ophthalmology

## 2015-08-21 DIAGNOSIS — I10 Essential (primary) hypertension: Secondary | ICD-10-CM | POA: Diagnosis not present

## 2015-08-21 DIAGNOSIS — H43813 Vitreous degeneration, bilateral: Secondary | ICD-10-CM

## 2015-08-21 DIAGNOSIS — H3531 Nonexudative age-related macular degeneration: Secondary | ICD-10-CM

## 2015-08-21 DIAGNOSIS — H35033 Hypertensive retinopathy, bilateral: Secondary | ICD-10-CM

## 2015-08-21 DIAGNOSIS — H2701 Aphakia, right eye: Secondary | ICD-10-CM

## 2015-08-22 ENCOUNTER — Emergency Department (HOSPITAL_COMMUNITY): Payer: Medicare Other

## 2015-08-22 ENCOUNTER — Inpatient Hospital Stay (HOSPITAL_COMMUNITY)
Admission: EM | Admit: 2015-08-22 | Discharge: 2015-08-25 | DRG: 302 | Disposition: A | Discharge status: Hospice | Payer: Medicare Other | Attending: Cardiovascular Disease | Admitting: Cardiovascular Disease

## 2015-08-22 ENCOUNTER — Encounter: Payer: Self-pay | Admitting: Internal Medicine

## 2015-08-22 ENCOUNTER — Non-Acute Institutional Stay: Payer: Medicare Other | Admitting: Internal Medicine

## 2015-08-22 DIAGNOSIS — Z7982 Long term (current) use of aspirin: Secondary | ICD-10-CM

## 2015-08-22 DIAGNOSIS — I5033 Acute on chronic diastolic (congestive) heart failure: Secondary | ICD-10-CM

## 2015-08-22 DIAGNOSIS — G609 Hereditary and idiopathic neuropathy, unspecified: Secondary | ICD-10-CM | POA: Diagnosis present

## 2015-08-22 DIAGNOSIS — I471 Supraventricular tachycardia, unspecified: Secondary | ICD-10-CM

## 2015-08-22 DIAGNOSIS — I447 Left bundle-branch block, unspecified: Secondary | ICD-10-CM | POA: Diagnosis present

## 2015-08-22 DIAGNOSIS — I2102 ST elevation (STEMI) myocardial infarction involving left anterior descending coronary artery: Secondary | ICD-10-CM

## 2015-08-22 DIAGNOSIS — F0390 Unspecified dementia without behavioral disturbance: Secondary | ICD-10-CM | POA: Diagnosis present

## 2015-08-22 DIAGNOSIS — R7989 Other specified abnormal findings of blood chemistry: Secondary | ICD-10-CM

## 2015-08-22 DIAGNOSIS — G4700 Insomnia, unspecified: Secondary | ICD-10-CM | POA: Diagnosis present

## 2015-08-22 DIAGNOSIS — M1990 Unspecified osteoarthritis, unspecified site: Secondary | ICD-10-CM | POA: Diagnosis present

## 2015-08-22 DIAGNOSIS — R079 Chest pain, unspecified: Secondary | ICD-10-CM | POA: Diagnosis not present

## 2015-08-22 DIAGNOSIS — H409 Unspecified glaucoma: Secondary | ICD-10-CM | POA: Diagnosis present

## 2015-08-22 DIAGNOSIS — K219 Gastro-esophageal reflux disease without esophagitis: Secondary | ICD-10-CM | POA: Diagnosis present

## 2015-08-22 DIAGNOSIS — I25110 Atherosclerotic heart disease of native coronary artery with unstable angina pectoris: Secondary | ICD-10-CM | POA: Diagnosis not present

## 2015-08-22 DIAGNOSIS — E785 Hyperlipidemia, unspecified: Secondary | ICD-10-CM | POA: Diagnosis present

## 2015-08-22 DIAGNOSIS — Z96642 Presence of left artificial hip joint: Secondary | ICD-10-CM | POA: Diagnosis present

## 2015-08-22 DIAGNOSIS — M542 Cervicalgia: Secondary | ICD-10-CM | POA: Diagnosis present

## 2015-08-22 DIAGNOSIS — I252 Old myocardial infarction: Secondary | ICD-10-CM

## 2015-08-22 DIAGNOSIS — M940 Chondrocostal junction syndrome [Tietze]: Secondary | ICD-10-CM | POA: Diagnosis present

## 2015-08-22 DIAGNOSIS — I482 Chronic atrial fibrillation: Secondary | ICD-10-CM | POA: Diagnosis present

## 2015-08-22 DIAGNOSIS — I2510 Atherosclerotic heart disease of native coronary artery without angina pectoris: Secondary | ICD-10-CM | POA: Diagnosis not present

## 2015-08-22 DIAGNOSIS — I10 Essential (primary) hypertension: Secondary | ICD-10-CM | POA: Diagnosis present

## 2015-08-22 DIAGNOSIS — Z515 Encounter for palliative care: Secondary | ICD-10-CM | POA: Insufficient documentation

## 2015-08-22 DIAGNOSIS — I481 Persistent atrial fibrillation: Secondary | ICD-10-CM | POA: Diagnosis present

## 2015-08-22 DIAGNOSIS — Z66 Do not resuscitate: Secondary | ICD-10-CM | POA: Diagnosis present

## 2015-08-22 DIAGNOSIS — I200 Unstable angina: Secondary | ICD-10-CM | POA: Diagnosis not present

## 2015-08-22 DIAGNOSIS — E039 Hypothyroidism, unspecified: Secondary | ICD-10-CM | POA: Diagnosis present

## 2015-08-22 DIAGNOSIS — Z85828 Personal history of other malignant neoplasm of skin: Secondary | ICD-10-CM

## 2015-08-22 DIAGNOSIS — I4891 Unspecified atrial fibrillation: Secondary | ICD-10-CM | POA: Diagnosis present

## 2015-08-22 DIAGNOSIS — R0902 Hypoxemia: Secondary | ICD-10-CM | POA: Diagnosis present

## 2015-08-22 DIAGNOSIS — Z79899 Other long term (current) drug therapy: Secondary | ICD-10-CM

## 2015-08-22 LAB — CBC WITH DIFFERENTIAL/PLATELET
BASOS PCT: 0 % (ref 0–1)
Basophils Absolute: 0 10*3/uL (ref 0.0–0.1)
EOS ABS: 0 10*3/uL (ref 0.0–0.7)
Eosinophils Relative: 0 % (ref 0–5)
HEMATOCRIT: 43.2 % (ref 36.0–46.0)
Hemoglobin: 14 g/dL (ref 12.0–15.0)
LYMPHS ABS: 1.4 10*3/uL (ref 0.7–4.0)
Lymphocytes Relative: 11 % — ABNORMAL LOW (ref 12–46)
MCH: 33.3 pg (ref 26.0–34.0)
MCHC: 32.4 g/dL (ref 30.0–36.0)
MCV: 102.9 fL — ABNORMAL HIGH (ref 78.0–100.0)
MONOS PCT: 10 % (ref 3–12)
Monocytes Absolute: 1.2 10*3/uL — ABNORMAL HIGH (ref 0.1–1.0)
NEUTROS PCT: 79 % — AB (ref 43–77)
Neutro Abs: 9.8 10*3/uL — ABNORMAL HIGH (ref 1.7–7.7)
Platelets: 236 10*3/uL (ref 150–400)
RBC: 4.2 MIL/uL (ref 3.87–5.11)
RDW: 14.4 % (ref 11.5–15.5)
WBC: 12.4 10*3/uL — AB (ref 4.0–10.5)

## 2015-08-22 LAB — BASIC METABOLIC PANEL
Anion gap: 6 (ref 5–15)
BUN: 25 mg/dL — ABNORMAL HIGH (ref 6–20)
CO2: 26 mmol/L (ref 22–32)
Calcium: 8.9 mg/dL (ref 8.9–10.3)
Chloride: 102 mmol/L (ref 101–111)
Creatinine, Ser: 1.26 mg/dL — ABNORMAL HIGH (ref 0.44–1.00)
GFR calc non Af Amer: 36 mL/min — ABNORMAL LOW (ref >60)
GFR, EST AFRICAN AMERICAN: 41 mL/min — AB (ref >60)
Glucose, Bld: 123 mg/dL — ABNORMAL HIGH (ref 65–99)
POTASSIUM: 4.5 mmol/L (ref 3.5–5.1)
SODIUM: 134 mmol/L — AB (ref 135–145)

## 2015-08-22 LAB — I-STAT TROPONIN, ED: TROPONIN I, POC: 0.02 ng/mL (ref 0.00–0.08)

## 2015-08-22 LAB — D-DIMER, QUANTITATIVE: D-Dimer, Quant: 1.42 ug/mL-FEU — ABNORMAL HIGH (ref 0.00–0.48)

## 2015-08-22 LAB — PROTIME-INR
INR: 1.04 (ref 0.00–1.49)
PROTHROMBIN TIME: 13.8 s (ref 11.6–15.2)

## 2015-08-22 LAB — BRAIN NATRIURETIC PEPTIDE: B NATRIURETIC PEPTIDE 5: 463.2 pg/mL — AB (ref 0.0–100.0)

## 2015-08-22 LAB — TROPONIN I: TROPONIN I: 0.05 ng/mL — AB (ref <0.031)

## 2015-08-22 MED ORDER — BRINZOLAMIDE 1 % OP SUSP
1.0000 [drp] | Freq: Two times a day (BID) | OPHTHALMIC | Status: DC
  Administered 2015-08-22 – 2015-08-25 (×6): 1 [drp] via OPHTHALMIC
  Filled 2015-08-22 (×2): qty 10

## 2015-08-22 MED ORDER — HEPARIN (PORCINE) IN NACL 100-0.45 UNIT/ML-% IJ SOLN
800.0000 [IU]/h | INTRAMUSCULAR | Status: DC
  Administered 2015-08-22: 1000 [IU]/h via INTRAVENOUS
  Administered 2015-08-24: 800 [IU]/h via INTRAVENOUS
  Filled 2015-08-22 (×3): qty 250

## 2015-08-22 MED ORDER — POTASSIUM CHLORIDE CRYS ER 20 MEQ PO TBCR
20.0000 meq | EXTENDED_RELEASE_TABLET | Freq: Two times a day (BID) | ORAL | Status: DC
  Administered 2015-08-22 – 2015-08-25 (×6): 20 meq via ORAL
  Filled 2015-08-22 (×6): qty 1

## 2015-08-22 MED ORDER — FUROSEMIDE 40 MG PO TABS
40.0000 mg | ORAL_TABLET | Freq: Every day | ORAL | Status: DC
  Administered 2015-08-23 – 2015-08-25 (×3): 40 mg via ORAL
  Filled 2015-08-22 (×3): qty 1

## 2015-08-22 MED ORDER — ASPIRIN EC 81 MG PO TBEC
81.0000 mg | DELAYED_RELEASE_TABLET | Freq: Every day | ORAL | Status: DC
  Administered 2015-08-23 – 2015-08-25 (×3): 81 mg via ORAL
  Filled 2015-08-22 (×3): qty 1

## 2015-08-22 MED ORDER — ONDANSETRON HCL 4 MG/2ML IJ SOLN: 4.0000 mg | Freq: Four times a day (QID) | INTRAMUSCULAR | Status: DC | PRN

## 2015-08-22 MED ORDER — LEVOTHYROXINE SODIUM 50 MCG PO TABS
50.0000 ug | ORAL_TABLET | Freq: Every day | ORAL | Status: DC
  Administered 2015-08-23 – 2015-08-25 (×3): 50 ug via ORAL
  Filled 2015-08-22 (×3): qty 1

## 2015-08-22 MED ORDER — ATORVASTATIN CALCIUM 20 MG PO TABS
20.0000 mg | ORAL_TABLET | Freq: Every day | ORAL | Status: DC
  Administered 2015-08-22 – 2015-08-24 (×3): 20 mg via ORAL
  Filled 2015-08-22 (×3): qty 1

## 2015-08-22 MED ORDER — ADULT MULTIVITAMIN W/MINERALS CH
1.0000 | ORAL_TABLET | Freq: Every day | ORAL | Status: DC
  Administered 2015-08-23 – 2015-08-25 (×3): 1 via ORAL
  Filled 2015-08-22 (×3): qty 1

## 2015-08-22 MED ORDER — ACETAMINOPHEN 325 MG PO TABS
650.0000 mg | ORAL_TABLET | ORAL | Status: DC | PRN
  Administered 2015-08-23 – 2015-08-25 (×2): 650 mg via ORAL
  Filled 2015-08-22 (×2): qty 2

## 2015-08-22 MED ORDER — EZETIMIBE 10 MG PO TABS
10.0000 mg | ORAL_TABLET | Freq: Every day | ORAL | Status: DC
  Administered 2015-08-23 – 2015-08-25 (×3): 10 mg via ORAL
  Filled 2015-08-22 (×3): qty 1

## 2015-08-22 MED ORDER — LATANOPROST 0.005 % OP SOLN
1.0000 [drp] | Freq: Every day | OPHTHALMIC | Status: DC
  Administered 2015-08-22 – 2015-08-24 (×3): 1 [drp] via OPHTHALMIC
  Filled 2015-08-22 (×2): qty 2.5

## 2015-08-22 MED ORDER — SODIUM CHLORIDE 0.9 % IJ SOLN: 3.0000 mL | INTRAMUSCULAR | Status: DC | PRN

## 2015-08-22 MED ORDER — HEPARIN BOLUS VIA INFUSION
2000.0000 [IU] | Freq: Once | INTRAVENOUS | Status: AC
  Administered 2015-08-22: 2000 [IU] via INTRAVENOUS
  Filled 2015-08-22: qty 2000

## 2015-08-22 MED ORDER — PNEUMOCOCCAL VAC POLYVALENT 25 MCG/0.5ML IJ INJ
0.5000 mL | INJECTION | INTRAMUSCULAR | Status: AC
  Administered 2015-08-23: 0.5 mL via INTRAMUSCULAR
  Filled 2015-08-22: qty 0.5

## 2015-08-22 MED ORDER — NON FORMULARY: 1.0000 [drp] | Freq: Two times a day (BID) | Status: DC

## 2015-08-22 MED ORDER — FLUTICASONE FUROATE 27.5 MCG/SPRAY NA SUSP: 1.0000 | Freq: Two times a day (BID) | NASAL | Status: DC

## 2015-08-22 MED ORDER — FLUTICASONE PROPIONATE 50 MCG/ACT NA SUSP
1.0000 | Freq: Two times a day (BID) | NASAL | Status: DC
  Administered 2015-08-22 – 2015-08-25 (×6): 1 via NASAL
  Filled 2015-08-22 (×2): qty 16

## 2015-08-22 MED ORDER — POTASSIUM CHLORIDE ER 20 MEQ PO TBCR: 20.0000 meq | EXTENDED_RELEASE_TABLET | Freq: Two times a day (BID) | ORAL | Status: DC

## 2015-08-22 MED ORDER — SODIUM CHLORIDE 0.9 % IJ SOLN
3.0000 mL | Freq: Two times a day (BID) | INTRAMUSCULAR | Status: DC
  Administered 2015-08-22 – 2015-08-24 (×4): 3 mL via INTRAVENOUS

## 2015-08-22 MED ORDER — SODIUM CHLORIDE 0.9 % IV BOLUS (SEPSIS)
250.0000 mL | Freq: Once | INTRAVENOUS | Status: AC
Start: 2015-08-22 — End: 2015-08-22
  Administered 2015-08-22: 250 mL via INTRAVENOUS

## 2015-08-22 MED ORDER — PINDOLOL 5 MG PO TABS
2.5000 mg | ORAL_TABLET | Freq: Every day | ORAL | Status: DC
  Filled 2015-08-22: qty 1

## 2015-08-22 MED ORDER — NITROGLYCERIN 0.4 MG SL SUBL
0.4000 mg | SUBLINGUAL_TABLET | SUBLINGUAL | Status: DC | PRN
  Administered 2015-08-23: 0.4 mg via SUBLINGUAL
  Filled 2015-08-22: qty 1

## 2015-08-22 MED ORDER — ASPIRIN 81 MG PO CHEW
324.0000 mg | CHEWABLE_TABLET | Freq: Once | ORAL | Status: AC
  Administered 2015-08-22: 324 mg via ORAL
  Filled 2015-08-22: qty 4

## 2015-08-22 MED ORDER — PANTOPRAZOLE SODIUM 40 MG PO TBEC
80.0000 mg | DELAYED_RELEASE_TABLET | Freq: Every day | ORAL | Status: DC
  Administered 2015-08-23 – 2015-08-25 (×3): 80 mg via ORAL
  Filled 2015-08-22 (×3): qty 2

## 2015-08-22 MED ORDER — IMIPRAMINE HCL 50 MG PO TABS: 50.0000 mg | ORAL_TABLET | Freq: Every day | ORAL | Status: DC

## 2015-08-22 MED ORDER — SODIUM CHLORIDE 0.9 % IV SOLN: 250.0000 mL | INTRAVENOUS | Status: DC | PRN

## 2015-08-22 MED ORDER — BRIMONIDINE TARTRATE 0.2 % OP SOLN
1.0000 [drp] | Freq: Two times a day (BID) | OPHTHALMIC | Status: DC
  Administered 2015-08-22 – 2015-08-25 (×6): 1 [drp] via OPHTHALMIC
  Filled 2015-08-22 (×2): qty 5

## 2015-08-22 MED ORDER — LISINOPRIL 10 MG PO TABS
10.0000 mg | ORAL_TABLET | Freq: Two times a day (BID) | ORAL | Status: DC
  Administered 2015-08-22 – 2015-08-25 (×6): 10 mg via ORAL
  Filled 2015-08-22 (×6): qty 1

## 2015-08-22 MED ORDER — LORATADINE 10 MG PO TABS
10.0000 mg | ORAL_TABLET | Freq: Every day | ORAL | Status: DC
  Administered 2015-08-23 – 2015-08-25 (×3): 10 mg via ORAL
  Filled 2015-08-22 (×3): qty 1

## 2015-08-22 NOTE — Progress Notes (Signed)
[name], female   DOB: February 11, 1920, 81 y.o.   MRN: [id number]  Location:  [facility] Provider:  Rexene Edison. Mariea Clonts, D.O., C.M.D.  Code Status:  DNR Goals of care: Advanced Directive information Does patient have an advance directive?: Yes, Type of Advance Directive: [place];Living will;Out of facility DNR (pink MOST or yellow form), Pre-existing out of facility DNR order (yellow form or pink MOST form): Pink MOST form placed in chart (order not valid for inpatient use);Yellow form placed in chart (order not valid for inpatient use)  Chief Complaint  Patient presents with  . Acute Visit    acute chest pain radiating through to her back, dyspnea    HPI:  81 yo female with h/o known CAD s/p MI in 2010 in setting of SVT with cath that was unsuccessful, has known LAD chronic occlusion, and recent difficulty with acute on chronic diastolic chf.  Hyperlipidemia, htn, and LBBB was seen acutely this morning around 11am for new onset of acute chest pain radiating through to her back.  She is unable to sit up due to dyspnea and pain.  She denies radiation to her left arm, neck, jaw.  She was noted to be tachycardic to 118 on vitals this am and hypoxic to 84, improved then to 90%.  Nursing had placed heating pad behind her back which did help some and pain is better than it was when it began about 30 mins ago.  EKG performed showed possible anteroseptal MI and inverted T waves in I, aVL and V6.  Machine read VT but P waves visible suggesting SVT which she is known to have (pt awake, alert and oriented).  Discussed with pt and staff notified her daughter and they agree to ED transfer for evaluation and treatment.  Her daughter was suspicious it might be her heart given her known heart disease.  Of note, she also has h/o GI bleed.  She is on baby asa.  She's appropriately on statin, zetia, lasix, lisinopril.  Dr. Percival Spanish is her cardiologist.  Review of Systems:  Review  of Systems  Constitutional: Positive for diaphoresis. Negative for fever and chills.  HENT: Negative for congestion.   Eyes: Positive for blurred vision.  Respiratory: Positive for shortness of breath. Negative for cough and sputum production.   Cardiovascular: Positive for chest pain, palpitations, orthopnea and leg swelling. Negative for PND.       Radiating to back; not reproducible in back  Gastrointestinal: Negative for abdominal pain and constipation.  Genitourinary: Negative for dysuria.  Musculoskeletal: Positive for back pain. Negative for falls.  Skin: Negative for itching and rash.  Neurological: Negative for dizziness and weakness.  Endo/Heme/Allergies: Bruises/bleeds easily.  Psychiatric/Behavioral: Positive for memory loss.       Mild cognitive impairment    Past Medical History  Diagnosis Date  . SVT (supraventricular tachycardia)     Diagnosed 2010  . HTN (hypertension)   . Dyslipidemia   . CAD (coronary artery disease)     a. NSTEMI 2010 in setting of SVT, with cath with unsuccessful PCI - chronic total occlusion of LAD, R->L collaterals.  . GI bleeding   . Unspecified hypothyroidism   . Unspecified hereditary and idiopathic peripheral neuropathy   . Pneumonia, organism unspecified   . Basal cell carcinoma of skin of trunk, except scrotum   . Unspecified malignant neoplasm of skin of lower limb, including hip   . Basal cell carcinoma of skin of lower limb,  including hip   . Unspecified glaucoma     left eye  . Other premature beats   . Other esophagitis   . Esophageal reflux   . Osteoarthrosis, unspecified whether generalized or localized, unspecified site     multiple joints  . Pain in joint, shoulder region   . Pain in joint, lower leg     knee  . Pain in joint, ankle and foot     right  . Sacroiliitis, not elsewhere classified   . Cervicalgia   . Lumbago   . Ganglion of tendon sheath   . Pathologic fracture of vertebrae   . Tietze's disease   .  Insomnia, unspecified   . Disturbance of skin sensation   . Edema   . Palpitations   . Other nonspecific abnormal serum enzyme levels   . Nasal bones, closed fracture   . Closed fracture of unspecified trochanteric section of femur   . Open wound of knee, leg (except thigh), and ankle, without mention of complication   . Hip, thigh, leg, and ankle, abrasion or friction burn, without mention of infection   . Contusion of hip   . Fall from other slipping, tripping, or stumbling   . Personal history of fall   . Dyspepsia and other specified disorders of function of stomach   . Paroxysmal supraventricular tachycardia   . Acute bronchiolitis due to other infectious organisms 03/02/2013  . Other abnormal blood chemistry 03/15/2013  . Contusion of rib on left side 08/03/2013  . Hypertension   . Coronary artery disease   . Glaucoma     Patient Active Problem List   Diagnosis Date Noted  . Atrial fibrillation, unspecified 08/07/2015  . Dislocated IOL (intraocular lens), posterior 04/25/2015  . Memory loss 04/11/2015  . Poor appetite 01/05/2015  . Aortic aneurysm 12/28/2014  . Other emphysema 12/28/2014  . Chronic diastolic heart failure 15/17/6160  . SOB (shortness of breath) 11/01/2014  . Tachycardia-bradycardia syndrome 10/08/2014  . Acute on chronic diastolic heart failure 73/71/0626  . LBBB (left bundle branch block) 10/06/2014  . CAD (coronary artery disease) 10/06/2014  . PSVT (paroxysmal supraventricular tachycardia) 10/06/2014  . Sinus bradycardia 10/06/2014  . Acute CHF 10/06/2014  . Open wound of knee 10/03/2014  . Closed fracture nose 09/12/2014  . HTN (hypertension) 08/03/2014  . Advanced directives, counseling/discussion 05/25/2014  . Palpitations 05/24/2014  . Dyspnea 04/25/2014  . Skin cancer of arm 04/05/2014  . Carotid atherosclerosis 04/05/2014  . Dizzy 04/04/2014  . Nausea alone 04/04/2014  . Syncope 02/14/2014  . Open wound of left heel 02/07/2014  .  Fracture of finger of right hand with routine healing 01/10/2014  . Hypotension, unspecified 08/11/2013  . Chest pain 08/11/2013  . Contusion of rib on left side 08/03/2013  . Ganglion of tendon sheath 06/28/2013  . Hyperglycemia 06/28/2013  . Hypothyroidism 06/28/2013  . Nocturia 03/29/2013  . Gait disorder 11/10/2011  . PSVT 11/29/2010  . CORONARY ATHEROSCLEROSIS NATIVE CORONARY ARTERY 07/18/2010  . DYSLIPIDEMIA 07/16/2010  . MI 07/16/2010    No Known Allergies  Medications: Patient's Medications  New Prescriptions   No medications on file  Previous Medications   ACETAMINOPHEN (TYLENOL) 500 MG TABLET    Take 500 mg by mouth 2 (two) times daily. Take two tablets twice a day   AMOXICILLIN (AMOXIL) 500 MG TABLET    Take 500 mg by mouth as needed (Dental appointment). Take 4 capsules one hour prior to dental appointment   ASPIRIN EC 81  MG TABLET    Take 81 mg by mouth every evening.   ATORVASTATIN (LIPITOR) 20 MG TABLET    TAKE 1 TABLET BY MOUTH DAILY TO LOWER LIPIDS   BRINZOLAMIDE-BRIMONIDINE (SIMBRINZA) 1-0.2 % SUSP    Apply to eye. One drop in both eyes twice daily   EZETIMIBE (ZETIA) 10 MG TABLET    Take 10 mg by mouth daily.   FLUTICASONE (VERAMYST) 27.5 MCG/SPRAY NASAL SPRAY    Place 1 spray into the nose 2 (two) times daily.   FUROSEMIDE (LASIX) 20 MG TABLET    Take 40 mg by mouth. Take one tablet in morning, take 40 mg once a day   HYDROCODONE-ACETAMINOPHEN (NORCO) 5-325 MG PER TABLET    Take 1 tablet by mouth every 6 (six) hours as needed for severe pain.   IMIPRAMINE (TOFRANIL) 50 MG TABLET    Take 50 mg by mouth at bedtime.   LATANOPROST (XALATAN) 0.005 % OPHTHALMIC SOLUTION    Place 1 drop into both eyes every evening. UAD   LEVOTHYROXINE (SYNTHROID, LEVOTHROID) 50 MCG TABLET    TAKE 1 TABLET BY MOUTH ONCE DAILY   LISINOPRIL (PRINIVIL,ZESTRIL) 10 MG TABLET    Take 1 tablet (10 mg total) by mouth 2 (two) times daily.   LORATADINE (CLARITIN) 10 MG TABLET    Take 10 mg by  mouth daily.   MELATONIN 5 MG TABS    Take 1 tablet by mouth at bedtime and may repeat dose one time if needed. Take one tablet at bedtime as needed for insomnia   MULTIPLE VITAMIN (MULTIVITAMIN WITH MINERALS) TABS    Take 1 tablet by mouth daily. Certavite-A   MULTIPLE VITAMINS-MINERALS (PRESERVISION/LUTEIN) CAPS    Take by mouth. Take one twice daily for eyes   OMEPRAZOLE (PRILOSEC) 20 MG CAPSULE    Take 40 mg by mouth daily.    PINDOLOL (VISKEN) 5 MG TABLET    Take 2.5 mg by mouth daily.   POTASSIUM CHLORIDE ER 20 MEQ TBCR    Take by mouth. One twice daily   SALINE NASAL SPRAY NA    Place into the nose. One spray nasal every 2 hours up to 24 hours as needed   TROLAMINE SALICYLATE (ASPERCREME) 10 % CREAM    Apply 1 application topically as needed for muscle pain.  Modified Medications   No medications on file  Discontinued Medications   No medications on file    Physical Exam: Filed Vitals:   08/22/15 1141 08/22/15 1142  BP: 147/88   Pulse: 118   Temp: 97.4 F (36.3 C)   Resp: 22   SpO2: 84% 90%   There is no weight on file to calculate BMI.  Physical Exam  Constitutional: She is oriented to person, place, and time.  Thin white female  Eyes:  glasses  Neck: Neck supple. No JVD present.  Cardiovascular:  tachycardic  Pulmonary/Chest: Effort normal and breath sounds normal. No respiratory distress. She has no rales.  Abdominal: Soft. Bowel sounds are normal.  Musculoskeletal: Normal range of motion.  Uses walker s/p hip surgery  Neurological: She is alert and oriented to person, place, and time.  Skin: Skin is warm.  diaphoretic  Psychiatric: She has a normal mood and affect.   Labs reviewed: Basic Metabolic Panel:  Recent Labs  10/08/14 0325 10/14/14 1138  03/23/15 04/18/15 1141 08/07/15  NA 137 136  < > 139 139 134*  K 4.8 4.7  < > 4.0 4.1 3.9  CL 102 102  --   --  106  --   CO2 24 26  --   --  27  --   GLUCOSE 87 99  --   --  126*  --   BUN 20 15  < > 24*  24* 29*  CREATININE 1.08 0.95  < > 0.9 1.00 1.1  CALCIUM 8.6 9.1  --   --  8.7  --   < > = values in this interval not displayed.  Liver Function Tests:  Recent Labs  10/06/14 1635 03/23/15  AST 34 17  ALT 29 14  ALKPHOS 137* 157*  BILITOT 0.6  --   PROT 7.7  --   ALBUMIN 3.2*  --     CBC:  Recent Labs  09/02/14 1136 09/08/14 1230 10/06/14 1635 03/23/15 04/18/15 1141  WBC 6.4 10.4 7.8 8.9 7.3  NEUTROABS 4.3 7.6 5.0  --   --   HGB 14.6 14.0 13.2 14.0 12.4  HCT 43.0 42.4 40.2 41 39.4  MCV 97.7 98.6 98.0  --  103.4*  PLT 173 182 232 199 197    Lab Results  Component Value Date   TSH 1.56 03/23/2015   Lab Results  Component Value Date   HGBA1C 5.7 12/21/2013   Lab Results  Component Value Date   CHOL 129 03/24/2014   HDL 29* 03/24/2014   LDLCALC 69 03/24/2014   TRIG 117 03/24/2014   CHOLHDL 7.3 04/28/2009    Significant Diagnostic Results since last visit: see EKG described above--sent with pt to ED along with old EKG and MOST form  Patient Care Team: Gayland Curry, DO as PCP - General (Geriatric Medicine) Imogene Burn, PA-C as Physician Assistant (Cardiology) Mardene Celeste, NP as Nurse Practitioner (Geriatric Medicine) [facility] Minus Breeding, MD as Consulting Physician (Cardiology) Estill Dooms, MD (Internal Medicine)  Assessment/Plan 1. Acute ST elevation myocardial infarction (STEMI) involving left anterior descending (LAD) coronary artery -sent out to ED with acute chest pain and dyspnea and suspicious EKG given longstanding occlusion of LAD -had previous cath in 2010 as in hpi -started on O2, ntg 2. Acute on chronic diastolic congestive heart failure -this had just stabilized but now with acute event -was on short term bid lasix to address volume overload with weight improvement 3. SVT (supraventricular tachycardia) -known in this pt, follows with Dr. Percival Spanish  Family/ staff Communication: Nursing manager has  called her daughter and informed her  Labs/tests ordered:  EKG; Sent to ED  Jakie Debow L. Dwana Garin, D.O. [facility] [address], [place] [address] Cell Phone (Mon-Fri 8am-5pm):  [phone] On Call:  [phone] & follow prompts after 5pm & weekends Office Phone:  [phone] Office Fax:  [phone]

## 2015-08-22 NOTE — H&P (Signed)
CARDIOLOGY CONSULT NOTE   [name] MRN: [id number], DOB/AGE: [birth year]   Admit date: 08/22/2015 Date of Consult: 08/22/2015   Primary Physician: Hollace Kinnier, DO Primary Cardiologist: Dr. Percival Spanish  HPI: [name] is a 81 y.o. female with past medical history of CAD (Cath 2010 w/ unsuccessful PCI - chronic occlusion of LAD), Atrial Fibrillation (rate controlled only), Diastolic HF, HTN, HLD, HTN, Hypothyroidism and PSVT who presented to [facility] ED [facility] on 08/22/15 for sharp chest pain that started at 3:00AM.  Reports the pain woke her from sleep. Says it has been a sharp pain and rates it as a constant 5/10. Says positional changes or taking deep breaths does not influence the pain. NTG administration did not help the pain. She has experienced some increased exertional shortness of breath. She denies any associated diaphoresis, nausea, or vomiting.  Was diagnosed with new onset of Atrial Fibrillation in early August 2016. When she last saw Dr. Percival Spanish on 08/11/15, he recommended just continuing her rate control (Pindolol 2.5mg  daily)  and that she was too high risk for anticoagulation. For her Diastolic HF, she is currently on Lasix 40mg  daily.Her baseline weight is 149lbs, but her current weight today is 153lbs.  BNP found to be elevated to 463 and CXR shows interstitial edema with small pleural effusions.  Last Echocardiogram was in 09/2014 showed EF of 50-55%. Last cardiac catheterization was in 03/2009 and showed chronic total occlusion of the left anterior descending with right-to-left collaterals.   Problem List Past Medical History  Diagnosis Date  . SVT (supraventricular tachycardia)     Diagnosed 2010  . HTN (hypertension)   . Dyslipidemia   . CAD (coronary artery disease)     a. NSTEMI 2010 in setting of SVT, with cath with unsuccessful PCI - chronic total occlusion of LAD, R->L collaterals.  . GI bleeding   . Unspecified hypothyroidism   . Unspecified  hereditary and idiopathic peripheral neuropathy   . Pneumonia, organism unspecified   . Basal cell carcinoma of skin of trunk, except scrotum   . Unspecified malignant neoplasm of skin of lower limb, including hip   . Basal cell carcinoma of skin of lower limb, including hip   . Unspecified glaucoma     left eye  . Other premature beats   . Other esophagitis   . Esophageal reflux   . Osteoarthrosis, unspecified whether generalized or localized, unspecified site     multiple joints  . Pain in joint, shoulder region   . Pain in joint, lower leg     knee  . Pain in joint, ankle and foot     right  . Sacroiliitis, not elsewhere classified   . Cervicalgia   . Lumbago   . Ganglion of tendon sheath   . Pathologic fracture of vertebrae   . Tietze's disease   . Insomnia, unspecified   . Disturbance of skin sensation   . Edema   . Palpitations   . Other nonspecific abnormal serum enzyme levels   . Nasal bones, closed fracture   . Closed fracture of unspecified trochanteric section of femur   . Open wound of knee, leg (except thigh), and ankle, without mention of complication   . Hip, thigh, leg, and ankle, abrasion or friction burn, without mention of infection   . Contusion of hip   . Fall from other slipping, tripping, or stumbling   . Personal history of fall   . Dyspepsia and other specified disorders  of function of stomach   . Paroxysmal supraventricular tachycardia   . Acute bronchiolitis due to other infectious organisms 03/02/2013  . Other abnormal blood chemistry 03/15/2013  . Contusion of rib on left side 08/03/2013  . Hypertension   . Coronary artery disease   . Glaucoma     Past Surgical History  Procedure Laterality Date  . Total hip arthroplasty Left 2001  . Breast lumpectomy Right 1995     negative for malignancy  . Cataract extraction w/ intraocular lens  implant, bilateral Bilateral 2003  . Leg skin lesion  biopsy / excision Left 08/22/2010    Lower leg shave  biopsy -solar keratosis (Dr. Radford Pax)  . Joint replacement    . Eye surgery    . Pars plana vitrectomy Right 04/25/2015    Procedure: PARS PLANA VITRECTOMY WITH 25G REMOVAL/SUTURE INTRAOCULAR LENS; HEADSCOPE LASER;GAS FLUID EXCHANGE;  Surgeon: Hayden Pedro, MD;  Location: [facility];  Service: Ophthalmology;  Laterality: Right;     Allergies: No Known Allergies    Inpatient Medications:    Family History: Family History  Problem Relation Age of Onset  . Stroke Neg Hx   . Heart attack Neg Hx   . Heart disease Father   . Cancer Brother     lung     Social History Social History   Social History  . Marital Status: Widowed    Spouse Name: N/A  . Number of Children: N/A  . Years of Education: N/A   Occupational History  . Not on file.   Social History Main Topics  . Smoking status: Never Smoker   . Smokeless tobacco: Never Used  . Alcohol Use: No     Comment: 10/06/2014 "drinks a scotch or wine every other day; nothing in the last 4-5 months"  . Drug Use: No  . Sexual Activity: No   Other Topics Concern  . Not on file   Social History Narrative   ** Merged History Encounter **       Patient is Married x 2(1st husband died age 76 , [place]) Widowed husband died 12-29-14;  Occupation: House wife.  Lives in single level home, Independent Living  section at [place] since 2008, [place] to [place] 02/15/2015. has caregiver assistance in the 7am-1pm, 5-9,pm 11pm-7am   No Smoking history , Minimal Alcohol history   POA, DNR, MOST   Exercise: PT 3 x week   Social/acitives daily                  Review of Systems General:  No chills, fever, night sweats or weight changes.  Cardiovascular:  No edema, orthopnea, palpitations, paroxysmal nocturnal dyspnea. Positive for chest pain and dyspnea on exertion. Dermatological: No rash, lesions/masses Respiratory: No cough. Urologic: No hematuria, dysuria Abdominal:   No nausea, vomiting, diarrhea, bright red blood per  rectum, melena, or hematemesis Neurologic:  No visual changes, wkns, changes in mental status. All other systems reviewed and are otherwise negative except as noted above.  Physical Exam Blood pressure 113/65, pulse 94, temperature 98.3 F (36.8 C), temperature source Oral, resp. rate 23, SpO2 100 %.  General: Pleasant, elderly female in  NAD Psych: Normal affect. Neuro: Alert and oriented X 3. Moves all extremities spontaneously. HEENT: Normal  Neck: Supple without bruits or JVD. Lungs:  Resp regular and unlabored, CTA without wheezing or rales. Heart: Irregular rhythm, no s3, s4, or murmurs. Abdomen: Soft, non-tender, non-distended, BS + x 4.  Extremities: No clubbing, cyanosis  or edema. DP/PT/Radials 2+ and equal bilaterally.  Labs  No results for input(s): CKTOTAL, CKMB, TROPONINI in the last 72 hours. Lab Results  Component Value Date   WBC 12.4* 08/22/2015   HGB 14.0 08/22/2015   HCT 43.2 08/22/2015   MCV 102.9* 08/22/2015   PLT 236 08/22/2015     Recent Labs Lab 08/22/15 1353  NA 134*  K 4.5  CL 102  CO2 26  BUN 25*  CREATININE 1.26*  CALCIUM 8.9  GLUCOSE 123*   Radiology/Studies Dg Chest 2 View:08/22/2015   CLINICAL DATA:  Epigastric pain, history of Coronary artery disease, CHF  EXAM: CHEST  2 VIEW  COMPARISON:  None in PACs  FINDINGS: The lungs are adequately inflated. There are small bilateral pleural effusions layering posteriorly. The cardiac silhouette is enlarged. The pulmonary vascularity is engorged. The pulmonary interstitial markings are increased bilaterally. The bony thorax exhibits no acute abnormality. There are degenerative changes at multiple thoracic disc levels and of both shoulders.  IMPRESSION: CHF with interstitial edema and small bilateral pleural effusions.   Electronically Signed   By: David  Martinique M.D.   On: 08/22/2015 13:29    ECG: Atrial Fibrillation; Left Bundle Branch Block (consistent with EKG on 08/07/15)  ASSESSMENT AND PLAN  1.  Unstable Angina  - history of CAD with Cath in 2010 (unsuccessful PCI - chronic occlusion of LAD). Presenting today with sharp sternal chest pain that woke her from sleep overnight. New LBBB noted on EKG three weeks ago. - will monitor on telemetry and cycle enzymes - patient is DNR and does not wish to proceed with an invasive cardiac workup. - check d-dimer - start heparin - will continue ASA, BB, Statin, and Zetia.  2. Acute on Chronic Diastolic Heart Failure - recently diagnosed earlier this month.  - BNP elevated to 463. CXR shows interstitial edema and small bilateral pleural effusions. - continue Lasix 40mg  daily. - will obtain new echocardiogram.   3. Persistent Atrial Fibrillation - newly diagnosed in early August 2016. - continue rate control with Pindolol. Not a candidate for anti-coagulation due to high risk factors.  4. HTN - BP has been 100/65 - 147/88 while in the ED. - Will continue Lisinopril and Pindolol.  5. HLD -  Continue statin and Zetia.  6. Hypothyroidism -  Continue levothyroxine.   Signed, Dineen Kid, PA-C 08/22/2015, 4:56 PM   Agree with note by Dineen Kid PA-C  60 year old widowed Caucasian female with known CAD status post cardiac catheterization in 2010. That time she had a total LAD and underwent unsuccessful attempt at recanalization by Dr. Irish Lack . She had preserved LV function was otherwise noncritical CAD. She also has had newly diagnosed diastolic heart failure treated by Dr. Percival Spanish with oral diabetics. She was awakened with chest pain early this morning and shortness of breath. Her EKG shows left bundle-branch block. She does have persistent atrial fibrillation and is not on chronic regular. After long discussion the patient established. Pursue invasive workup and she wishes to BE a DO NOT RESUSCITATE as well. We will admit, anticoagulate, cycle enzymes and reassess in the morning.   Lorretta Harp, M.D., [place], [facility], [place]  [facility] [address] [place], [place]  [address]  [phone] 08/22/2015 5:14 PM

## 2015-08-22 NOTE — ED Provider Notes (Signed)
CSN: [id number]     Arrival date & time 08/22/15  1225 History   First MD Initiated Contact with Patient 08/22/15 1234     Chief Complaint  Patient presents with  . Weakness  . Chest Pain     (Consider location/radiation/quality/duration/timing/severity/associated sxs/prior Treatment) Patient is a 81 y.o. female presenting with chest pain. The history is provided by the patient.  Chest Pain Pain location:  Substernal area Pain quality: sharp and stabbing   Pain radiates to:  Does not radiate Pain radiates to the back: no   Pain severity:  Moderate Onset quality:  Sudden Duration:  10 hours Timing:  Intermittent Progression:  Worsening Chronicity:  New Relieved by:  Nothing Worsened by:  Nothing tried Ineffective treatments:  None tried Associated symptoms: shortness of breath   Associated symptoms: no dizziness, no fever, no headache, no nausea, no palpitations and not vomiting   Risk factors: coronary artery disease, high cholesterol and hypertension    81 yo F with a chief complaint of shortness of breath. This started this morning woke her from sleep. They going on for about 10 hours. Difficult history due to dementia. Patient states that she still has some difficulty breathing. Doesn't use oxygen at home. Has a history of coronary artery disease for 6 years ago.  Unknown if anything makes it better or worse.  Seen in clinic today and sent over for thought ST elevation MI.  Past Medical History  Diagnosis Date  . SVT (supraventricular tachycardia)     Diagnosed 2010  . HTN (hypertension)   . Dyslipidemia   . CAD (coronary artery disease)     a. NSTEMI 2010 in setting of SVT, with cath with unsuccessful PCI - chronic total occlusion of LAD, R->L collaterals.  . GI bleeding   . Unspecified hypothyroidism   . Unspecified hereditary and idiopathic peripheral neuropathy   . Pneumonia, organism unspecified   . Basal cell carcinoma of skin of trunk, except scrotum   .  Unspecified malignant neoplasm of skin of lower limb, including hip   . Basal cell carcinoma of skin of lower limb, including hip   . Unspecified glaucoma     left eye  . Other premature beats   . Other esophagitis   . Esophageal reflux   . Osteoarthrosis, unspecified whether generalized or localized, unspecified site     multiple joints  . Pain in joint, shoulder region   . Pain in joint, lower leg     knee  . Pain in joint, ankle and foot     right  . Sacroiliitis, not elsewhere classified   . Cervicalgia   . Lumbago   . Ganglion of tendon sheath   . Pathologic fracture of vertebrae   . Tietze's disease   . Insomnia, unspecified   . Disturbance of skin sensation   . Edema   . Palpitations   . Other nonspecific abnormal serum enzyme levels   . Nasal bones, closed fracture   . Closed fracture of unspecified trochanteric section of femur   . Open wound of knee, leg (except thigh), and ankle, without mention of complication   . Hip, thigh, leg, and ankle, abrasion or friction burn, without mention of infection   . Contusion of hip   . Fall from other slipping, tripping, or stumbling   . Personal history of fall   . Dyspepsia and other specified disorders of function of stomach   . Paroxysmal supraventricular tachycardia   . Acute bronchiolitis due  to other infectious organisms 03/02/2013  . Other abnormal blood chemistry 03/15/2013  . Contusion of rib on left side 08/03/2013  . Hypertension   . Coronary artery disease   . Glaucoma    Past Surgical History  Procedure Laterality Date  . Total hip arthroplasty Left 2001  . Breast lumpectomy Right 1995     negative for malignancy  . Cataract extraction w/ intraocular lens  implant, bilateral Bilateral 2003  . Leg skin lesion  biopsy / excision Left 08/22/2010    Lower leg shave biopsy -solar keratosis (Dr. Radford Pax)  . Joint replacement    . Eye surgery    . Pars plana vitrectomy Right 04/25/2015    Procedure: PARS PLANA  VITRECTOMY WITH 25G REMOVAL/SUTURE INTRAOCULAR LENS; HEADSCOPE LASER;GAS FLUID EXCHANGE;  Surgeon: Hayden Pedro, MD;  Location: [place];  Service: Ophthalmology;  Laterality: Right;   Family History  Problem Relation Age of Onset  . Stroke Neg Hx   . Heart attack Neg Hx   . Heart disease Father   . Cancer Brother     lung   Social History  Substance Use Topics  . Smoking status: Never Smoker   . Smokeless tobacco: Never Used  . Alcohol Use: No     Comment: 10/06/2014 "drinks a scotch or wine every other day; nothing in the last 4-5 months"   OB History    Gravida Para Term Preterm AB TAB SAB Ectopic Multiple Living   0 0 0 0 0 0 0 0       Review of Systems  Constitutional: Negative for fever and chills.  HENT: Negative for congestion and rhinorrhea.   Eyes: Negative for redness and visual disturbance.  Respiratory: Positive for shortness of breath. Negative for wheezing.   Cardiovascular: Positive for chest pain. Negative for palpitations.  Gastrointestinal: Negative for nausea and vomiting.  Genitourinary: Negative for dysuria and urgency.  Musculoskeletal: Negative for myalgias and arthralgias.  Skin: Negative for pallor and wound.  Neurological: Negative for dizziness and headaches.      Allergies  Review of patient's allergies indicates no known allergies.  Home Medications   Prior to Admission medications   Medication Sig Start Date End Date Taking? Authorizing Provider  acetaminophen (TYLENOL) 500 MG tablet Take 500 mg by mouth 2 (two) times daily. Take two tablets twice a day    Historical Provider, MD  amoxicillin (AMOXIL) 500 MG tablet Take 500 mg by mouth as needed (Dental appointment). Take 4 capsules one hour prior to dental appointment    Historical Provider, MD  aspirin EC 81 MG tablet Take 81 mg by mouth every evening.    Historical Provider, MD  atorvastatin (LIPITOR) 20 MG tablet TAKE 1 TABLET BY MOUTH DAILY TO LOWER LIPIDS 01/30/15   Lauree Chandler,  NP  Brinzolamide-Brimonidine [facility]) 1-0.2 % SUSP Apply to eye. One drop in both eyes twice daily    Historical Provider, MD  ezetimibe (ZETIA) 10 MG tablet Take 10 mg by mouth daily.    Historical Provider, MD  fluticasone (VERAMYST) 27.5 MCG/SPRAY nasal spray Place 1 spray into the nose 2 (two) times daily.    Historical Provider, MD  furosemide (LASIX) 20 MG tablet Take 40 mg by mouth. Take one tablet in morning, take 40 mg once a day    Historical Provider, MD  HYDROcodone-acetaminophen (NORCO) 5-325 MG per tablet Take 1 tablet by mouth every 6 (six) hours as needed for severe pain. 02/10/15   Sherwood Gambler, MD  imipramine (TOFRANIL) 50 MG tablet Take 50 mg by mouth at bedtime.    Historical Provider, MD  latanoprost (XALATAN) 0.005 % ophthalmic solution Place 1 drop into both eyes every evening. UAD    Historical Provider, MD  levothyroxine (SYNTHROID, LEVOTHROID) 50 MCG tablet TAKE 1 TABLET BY MOUTH ONCE DAILY 02/20/15   Tiffany L Reed, DO  lisinopril (PRINIVIL,ZESTRIL) 10 MG tablet Take 1 tablet (10 mg total) by mouth 2 (two) times daily. 10/14/14   Minus Breeding, MD  loratadine (CLARITIN) 10 MG tablet Take 10 mg by mouth daily.    Historical Provider, MD  Melatonin 5 MG TABS Take 1 tablet by mouth at bedtime and may repeat dose one time if needed. Take one tablet at bedtime as needed for insomnia    Historical Provider, MD  Multiple Vitamin (MULTIVITAMIN WITH MINERALS) TABS Take 1 tablet by mouth daily. Certavite-A    Historical Provider, MD  Multiple Vitamins-Minerals (PRESERVISION/LUTEIN) CAPS Take by mouth. Take one twice daily for eyes    Historical Provider, MD  omeprazole (PRILOSEC) 20 MG capsule Take 40 mg by mouth daily.     Historical Provider, MD  pindolol (VISKEN) 5 MG tablet Take 2.5 mg by mouth daily.    Historical Provider, MD  Potassium Chloride ER 20 MEQ TBCR Take by mouth. One twice daily    Historical Provider, MD  SALINE NASAL SPRAY NA Place into the nose. One spray  nasal every 2 hours up to 24 hours as needed    Historical Provider, MD  trolamine salicylate (ASPERCREME) 10 % cream Apply 1 application topically as needed for muscle pain.    Historical Provider, MD   BP 113/65 mmHg  Pulse 94  Temp(Src) 98.3 F (36.8 C) (Oral)  Resp 23  SpO2 100% Physical Exam  Constitutional: She is oriented to person, place, and time. She appears well-developed and well-nourished. No distress.  HENT:  Head: Normocephalic and atraumatic.  Eyes: EOM are normal. Pupils are equal, round, and reactive to light.  Neck: Normal range of motion. Neck supple.  Cardiovascular: Normal rate and regular rhythm.  Exam reveals no gallop and no friction rub.   No murmur heard. Pulmonary/Chest: Effort normal. She has no wheezes. She has no rales. She exhibits no tenderness.  Abdominal: Soft. She exhibits no distension. There is no tenderness. There is no rebound.  Musculoskeletal: She exhibits no edema or tenderness.  Neurological: She is alert and oriented to person, place, and time.  Skin: Skin is warm and dry. She is not diaphoretic.  Psychiatric: She has a normal mood and affect. Her behavior is normal.    ED Course  Procedures (including critical care time) Labs Review Labs Reviewed  CBC WITH DIFFERENTIAL/PLATELET - Abnormal; Notable for the following:    WBC 12.4 (*)    MCV 102.9 (*)    Neutrophils Relative % 79 (*)    Neutro Abs 9.8 (*)    Lymphocytes Relative 11 (*)    Monocytes Absolute 1.2 (*)    All other components within normal limits  BASIC METABOLIC PANEL - Abnormal; Notable for the following:    Sodium 134 (*)    Glucose, Bld 123 (*)    BUN 25 (*)    Creatinine, Ser 1.26 (*)    GFR calc non Af Amer 36 (*)    GFR calc Af Amer 41 (*)    All other components within normal limits  BRAIN NATRIURETIC PEPTIDE - Abnormal; Notable for the following:    B  Natriuretic Peptide 463.2 (*)    All other components within normal limits  D-DIMER, QUANTITATIVE (NOT AT  [facility])  Randolm Idol, ED    Imaging Review Dg Chest 2 View  08/22/2015   CLINICAL DATA:  Epigastric pain, history of Coronary artery disease, CHF  EXAM: CHEST  2 VIEW  COMPARISON:  None in PACs  FINDINGS: The lungs are adequately inflated. There are small bilateral pleural effusions layering posteriorly. The cardiac silhouette is enlarged. The pulmonary vascularity is engorged. The pulmonary interstitial markings are increased bilaterally. The bony thorax exhibits no acute abnormality. There are degenerative changes at multiple thoracic disc levels and of both shoulders.  IMPRESSION: CHF with interstitial edema and small bilateral pleural effusions.   Electronically Signed   By: David  Martinique M.D.   On: 08/22/2015 13:29   I have personally reviewed and evaluated these images and lab results as part of my medical decision-making.   EKG Interpretation   Date/Time:  Tuesday August 22 2015 12:33:12 EDT Ventricular Rate:  93 PR Interval:    QRS Duration: 128 QT Interval:  402 QTC Calculation: 500 R Axis:   -57 Text Interpretation:  Atrial fibrillation Left bundle branch block  Confirmed by Anysia Choi MD, Quillian Quince ([id number]) on 08/22/2015 1:17:25 PM         MDM   Final diagnoses:  Chest pain, unspecified chest pain type    81 year old female with a chief complaint of chest pain shortness breath. This will grow from sleep this morning. Patient was recently found to have CHF and was started on Lasix just a few weeks ago. Patient is followed by cardiology likely. Feel like the swelling has improved patient however was having chest pain this morning unsure feels like her prior MI.   Hypoxic on initial vitals started on oxygen. Will obtain a chest x-ray CBC BMP i-STAT troponin.  ECG different from prior, LBBB with no concordance. Compared to clinic EKG. Clinic EKG with possible discordant ST elevation concerning in lead 3 but only for 1 ventricular contraction. Will obtain first troponin and  discuss with cardiology.  First troponin negative. Cardiology consult. Chest x-ray and BNP consistent with CHF. Cardiology to admit.  The patients results and plan were reviewed and discussed.   Any x-rays performed were independently reviewed by myself.   Differential diagnosis were considered with the presenting HPI.  Medications  nitroGLYCERIN (NITROSTAT) SL tablet 0.4 mg (not administered)  aspirin chewable tablet 324 mg (324 mg Oral Given 08/22/15 1329)  sodium chloride 0.9 % bolus 250 mL (0 mLs Intravenous Stopped 08/22/15 1345)    Filed Vitals:   08/22/15 1330 08/22/15 1400 08/22/15 1430 08/22/15 1545  BP: 105/81 114/70 100/69 113/65  Pulse: 95 98 102 94  Temp:      TempSrc:      Resp: 19 14 13 23   SpO2: 100% 100% 99% 100%    Final diagnoses:  Chest pain, unspecified chest pain type    Admission/ observation were discussed with the admitting physician, patient and/or family and they are comfortable with the plan.     Deno Etienne, DO 08/22/15 1754

## 2015-08-22 NOTE — ED Notes (Signed)
EMS - Chest pain intermittent since 03:00 today.  Generalized weakness since yesterday.

## 2015-08-22 NOTE — Progress Notes (Signed)
ANTICOAGULATION CONSULT NOTE - Initial Consult  Pharmacy Consult for Heparin Indication: chest pain/ACS  No Known Allergies  Patient Measurements: Height 65 inches Weight 69.4kg  Vital Signs: Temp: 98.3 F (36.8 C) (08/23 1236) Temp Source: Oral (08/23 1236) BP: 124/72 mmHg (08/23 1800) Pulse Rate: 98 (08/23 1630)  Labs:  Recent Labs  08/22/15 1353  HGB 14.0  HCT 43.2  PLT 236  CREATININE 1.26*   Estimated Creatinine Clearance: 27.3 mL/min (by C-G formula based on Cr of 1.26).  Medical History: Past Medical History  Diagnosis Date  . SVT (supraventricular tachycardia)     Diagnosed 2010  . HTN (hypertension)   . Dyslipidemia   . CAD (coronary artery disease)     a. NSTEMI 2010 in setting of SVT, with cath with unsuccessful PCI - chronic total occlusion of LAD, R->L collaterals.  . GI bleeding   . Unspecified hypothyroidism   . Unspecified hereditary and idiopathic peripheral neuropathy   . Pneumonia, organism unspecified   . Basal cell carcinoma of skin of trunk, except scrotum   . Unspecified malignant neoplasm of skin of lower limb, including hip   . Basal cell carcinoma of skin of lower limb, including hip   . Unspecified glaucoma     left eye  . Other premature beats   . Other esophagitis   . Esophageal reflux   . Osteoarthrosis, unspecified whether generalized or localized, unspecified site     multiple joints  . Pain in joint, shoulder region   . Pain in joint, lower leg     knee  . Pain in joint, ankle and foot     right  . Sacroiliitis, not elsewhere classified   . Cervicalgia   . Lumbago   . Ganglion of tendon sheath   . Pathologic fracture of vertebrae   . Tietze's disease   . Insomnia, unspecified   . Disturbance of skin sensation   . Edema   . Palpitations   . Other nonspecific abnormal serum enzyme levels   . Nasal bones, closed fracture   . Closed fracture of unspecified trochanteric section of femur   . Open wound of knee, leg  (except thigh), and ankle, without mention of complication   . Hip, thigh, leg, and ankle, abrasion or friction burn, without mention of infection   . Contusion of hip   . Fall from other slipping, tripping, or stumbling   . Personal history of fall   . Dyspepsia and other specified disorders of function of stomach   . Paroxysmal supraventricular tachycardia   . Acute bronchiolitis due to other infectious organisms 03/02/2013  . Other abnormal blood chemistry 03/15/2013  . Contusion of rib on left side 08/03/2013  . Hypertension   . Coronary artery disease   . Glaucoma    Assessment: 81yo female with significant PMH, comes in with c/o sharp chest pain that started early this AM.  She has new onset Afib diagnosed in August of this year but was not felt to be a candidate for long term anticoagulation.  We have been asked to initiate IV heparin while her cardiac work-up is in progress.  She has a normal CBC and platelet count.  She has some mild renal dysfunction with a creatinine of 1.26 and an est. crcl of 27 ml/min.  She has no noted bleeding complications but does have a PMH significant for GI bleed  Goal of Therapy:  Heparin level 0.3-0.7 units/ml Monitor platelets by anticoagulation protocol: Yes   Plan:  Begin IV heparin with 2000 units IV bolus and start IV infusion at 1000 units/hr Check 6 hour heparin level CBC and heparin level daily  Rober Minion, PharmD., MS Clinical Pharmacist Pager:  [phone] Thank you for allowing pharmacy to be part of this patients care team. 08/22/2015,7:45 PM

## 2015-08-22 NOTE — Significant Event (Signed)
D-dimer elevated  Plan - CT angiogram - dose heparin for DVT/PE

## 2015-08-23 ENCOUNTER — Observation Stay (HOSPITAL_COMMUNITY): Payer: Medicare Other

## 2015-08-23 ENCOUNTER — Encounter (HOSPITAL_COMMUNITY): Payer: Self-pay

## 2015-08-23 DIAGNOSIS — M1990 Unspecified osteoarthritis, unspecified site: Secondary | ICD-10-CM | POA: Diagnosis not present

## 2015-08-23 DIAGNOSIS — F0390 Unspecified dementia without behavioral disturbance: Secondary | ICD-10-CM | POA: Diagnosis present

## 2015-08-23 DIAGNOSIS — I200 Unstable angina: Secondary | ICD-10-CM | POA: Diagnosis not present

## 2015-08-23 DIAGNOSIS — H409 Unspecified glaucoma: Secondary | ICD-10-CM | POA: Diagnosis not present

## 2015-08-23 DIAGNOSIS — K219 Gastro-esophageal reflux disease without esophagitis: Secondary | ICD-10-CM | POA: Diagnosis not present

## 2015-08-23 DIAGNOSIS — I25110 Atherosclerotic heart disease of native coronary artery with unstable angina pectoris: Secondary | ICD-10-CM | POA: Diagnosis not present

## 2015-08-23 DIAGNOSIS — I447 Left bundle-branch block, unspecified: Secondary | ICD-10-CM | POA: Diagnosis not present

## 2015-08-23 DIAGNOSIS — E785 Hyperlipidemia, unspecified: Secondary | ICD-10-CM | POA: Diagnosis not present

## 2015-08-23 DIAGNOSIS — R0600 Dyspnea, unspecified: Secondary | ICD-10-CM | POA: Diagnosis not present

## 2015-08-23 DIAGNOSIS — Z96642 Presence of left artificial hip joint: Secondary | ICD-10-CM | POA: Diagnosis not present

## 2015-08-23 DIAGNOSIS — M542 Cervicalgia: Secondary | ICD-10-CM | POA: Diagnosis not present

## 2015-08-23 DIAGNOSIS — G609 Hereditary and idiopathic neuropathy, unspecified: Secondary | ICD-10-CM | POA: Diagnosis not present

## 2015-08-23 DIAGNOSIS — Z79899 Other long term (current) drug therapy: Secondary | ICD-10-CM | POA: Diagnosis not present

## 2015-08-23 DIAGNOSIS — I10 Essential (primary) hypertension: Secondary | ICD-10-CM | POA: Diagnosis not present

## 2015-08-23 DIAGNOSIS — G4700 Insomnia, unspecified: Secondary | ICD-10-CM | POA: Diagnosis not present

## 2015-08-23 DIAGNOSIS — I252 Old myocardial infarction: Secondary | ICD-10-CM | POA: Diagnosis not present

## 2015-08-23 DIAGNOSIS — Z515 Encounter for palliative care: Secondary | ICD-10-CM | POA: Diagnosis not present

## 2015-08-23 DIAGNOSIS — I5033 Acute on chronic diastolic (congestive) heart failure: Secondary | ICD-10-CM | POA: Diagnosis not present

## 2015-08-23 DIAGNOSIS — I5022 Chronic systolic (congestive) heart failure: Secondary | ICD-10-CM | POA: Diagnosis not present

## 2015-08-23 DIAGNOSIS — R079 Chest pain, unspecified: Secondary | ICD-10-CM

## 2015-08-23 DIAGNOSIS — R0902 Hypoxemia: Secondary | ICD-10-CM | POA: Diagnosis not present

## 2015-08-23 DIAGNOSIS — I481 Persistent atrial fibrillation: Secondary | ICD-10-CM | POA: Diagnosis not present

## 2015-08-23 DIAGNOSIS — Z85828 Personal history of other malignant neoplasm of skin: Secondary | ICD-10-CM | POA: Diagnosis not present

## 2015-08-23 DIAGNOSIS — Z66 Do not resuscitate: Secondary | ICD-10-CM | POA: Diagnosis not present

## 2015-08-23 DIAGNOSIS — I509 Heart failure, unspecified: Secondary | ICD-10-CM | POA: Diagnosis not present

## 2015-08-23 DIAGNOSIS — I482 Chronic atrial fibrillation: Secondary | ICD-10-CM | POA: Diagnosis not present

## 2015-08-23 DIAGNOSIS — E039 Hypothyroidism, unspecified: Secondary | ICD-10-CM | POA: Diagnosis not present

## 2015-08-23 DIAGNOSIS — M940 Chondrocostal junction syndrome [Tietze]: Secondary | ICD-10-CM | POA: Diagnosis not present

## 2015-08-23 DIAGNOSIS — Z7982 Long term (current) use of aspirin: Secondary | ICD-10-CM | POA: Diagnosis not present

## 2015-08-23 DIAGNOSIS — I471 Supraventricular tachycardia: Secondary | ICD-10-CM | POA: Diagnosis not present

## 2015-08-23 LAB — TROPONIN I
Troponin I: 0.05 ng/mL — ABNORMAL HIGH (ref <0.031)
Troponin I: 0.05 ng/mL — ABNORMAL HIGH (ref <0.031)

## 2015-08-23 LAB — CBC
HEMATOCRIT: 41.4 % (ref 36.0–46.0)
HEMOGLOBIN: 13.3 g/dL (ref 12.0–15.0)
MCH: 32.7 pg (ref 26.0–34.0)
MCHC: 32.1 g/dL (ref 30.0–36.0)
MCV: 101.7 fL — AB (ref 78.0–100.0)
Platelets: 216 10*3/uL (ref 150–400)
RBC: 4.07 MIL/uL (ref 3.87–5.11)
RDW: 14.4 % (ref 11.5–15.5)
WBC: 10.9 10*3/uL — ABNORMAL HIGH (ref 4.0–10.5)

## 2015-08-23 LAB — BASIC METABOLIC PANEL
Anion gap: 10 (ref 5–15)
BUN: 21 mg/dL — AB (ref 6–20)
CHLORIDE: 105 mmol/L (ref 101–111)
CO2: 20 mmol/L — AB (ref 22–32)
CREATININE: 1.02 mg/dL — AB (ref 0.44–1.00)
Calcium: 8.6 mg/dL — ABNORMAL LOW (ref 8.9–10.3)
GFR calc Af Amer: 53 mL/min — ABNORMAL LOW (ref >60)
GFR calc non Af Amer: 46 mL/min — ABNORMAL LOW (ref >60)
GLUCOSE: 103 mg/dL — AB (ref 65–99)
Potassium: 4 mmol/L (ref 3.5–5.1)
SODIUM: 135 mmol/L (ref 135–145)

## 2015-08-23 LAB — HEPARIN LEVEL (UNFRACTIONATED)
Heparin Unfractionated: 0.68 IU/mL (ref 0.30–0.70)
Heparin Unfractionated: 0.87 IU/mL — ABNORMAL HIGH (ref 0.30–0.70)
Heparin Unfractionated: 0.71 IU/mL — ABNORMAL HIGH (ref 0.30–0.70)

## 2015-08-23 MED ORDER — IOHEXOL 350 MG/ML SOLN
80.0000 mL | Freq: Once | INTRAVENOUS | Status: AC | PRN
  Administered 2015-08-23: 70 mL via INTRAVENOUS

## 2015-08-23 MED ORDER — PERFLUTREN LIPID MICROSPHERE
1.0000 mL | INTRAVENOUS | Status: AC | PRN
  Administered 2015-08-23: 6 mL via INTRAVENOUS
  Filled 2015-08-23: qty 10

## 2015-08-23 MED ORDER — NITROGLYCERIN IN D5W 200-5 MCG/ML-% IV SOLN
0.0000 ug/min | INTRAVENOUS | Status: DC
  Administered 2015-08-23: 5 ug/min via INTRAVENOUS
  Filled 2015-08-23: qty 250

## 2015-08-23 MED ORDER — PINDOLOL 5 MG PO TABS
2.5000 mg | ORAL_TABLET | Freq: Two times a day (BID) | ORAL | Status: DC
  Administered 2015-08-23 (×2): 2.5 mg via ORAL
  Filled 2015-08-23 (×4): qty 1

## 2015-08-23 NOTE — Progress Notes (Signed)
ANTICOAGULATION CONSULT NOTE  Pharmacy Consult for Heparin Indication: R/O DVT  No Known Allergies  Patient Measurements: Height 65 inches Weight 69.4kg  Vital Signs: Temp: 98.3 F (36.8 C) (08/24 0000) Temp Source: Oral (08/24 0000) BP: 112/71 mmHg (08/24 0000) Pulse Rate: 103 (08/24 0000)  Labs:  Recent Labs  08/22/15 1353 08/22/15 2011 08/23/15 0135  HGB 14.0  --   --   HCT 43.2  --   --   PLT 236  --   --   LABPROT  --  13.8  --   INR  --  1.04  --   HEPARINUNFRC  --   --  0.68  CREATININE 1.26*  --   --   TROPONINI  --  0.05* 0.05*   Estimated Creatinine Clearance: 26.1 mL/min (by C-G formula based on Cr of 1.26).  Assessment: 81 y.o. female with chest pain, possible PE/DVT, for heparin  Chest CT negative for PE.  Dopplers pending.  Goal of Therapy:  Heparin level 0.3-0.7 units/ml Monitor platelets by anticoagulation protocol: Yes   Plan:  Continue Heparin at current rate  Recheck level later this morning to verify  Phillis Knack, PharmD, BCPS  08/23/2015,2:26 AM

## 2015-08-23 NOTE — Progress Notes (Signed)
Pt complained of chest pain 9/10, pain was relaeived with S/L Nitroglycerine X1, BP 114/79 P: 107. STAT EKG obtained and placed in the pt's chart.  Will continue to monitor.  Ferdinand Lango, RN

## 2015-08-23 NOTE — Progress Notes (Signed)
ANTICOAGULATION CONSULT NOTE - Follow-up  Pharmacy Consult for Heparin Indication: r/o DVT  No Known Allergies  Patient Measurements: Height 65 inches Weight 69.4kg  Vital Signs: Temp: 98.2 F (36.8 C) (08/24 1435) Temp Source: Oral (08/24 1435) BP: 114/83 mmHg (08/24 1435) Pulse Rate: 105 (08/24 1435)  Labs:  Recent Labs  08/22/15 1353 08/22/15 2011 08/23/15 0135 08/23/15 0706 08/23/15 1716  HGB 14.0  --   --  13.3  --   HCT 43.2  --   --  41.4  --   PLT 236  --   --  216  --   LABPROT  --  13.8  --   --   --   INR  --  1.04  --   --   --   HEPARINUNFRC  --   --  0.68 0.87* 0.71*  CREATININE 1.26*  --   --  1.02*  --   TROPONINI  --  0.05* 0.05* 0.05*  --    Estimated Creatinine Clearance: 32.3 mL/min (by C-G formula based on Cr of 1.02).  Assessment: 81yo female continues on IV heparin for r/o DVT. CT of the chest was negative for PE. Heparin level is now supratherapeutic at 0.71. H/H + plts are WNL and no bleeding noted.   Goal of Therapy:  Heparin level 0.3-0.7 units/ml Monitor platelets by anticoagulation protocol: Yes   Plan:  - Reduce heparin gtt to 800 units/hr - Check an 8 hour heparin level - Continue daily heparin level and CBC  Levester Fresh, PharmD, BCPS Clinical Pharmacist Pager [phone] 08/23/2015 6:02 PM

## 2015-08-23 NOTE — Progress Notes (Signed)
Nutrition Brief Note  Patient identified on the Malnutrition Screening Tool (MST) Report. Patient with weight loss related to fluids. She had gained 12 lbs over the past few months and is now on Lasix to remove excess volume. She is still slightly above usual weight of 140-145 lbs.   Wt Readings from Last 15 Encounters:  08/23/15 146 lb 8 oz (66.452 kg)  08/16/15 153 lb (69.4 kg)  08/11/15 157 lb 12.8 oz (71.578 kg)  08/07/15 157 lb (71.215 kg)  05/17/15 143 lb (64.864 kg)  04/25/15 145 lb (65.772 kg)  04/18/15 145 lb 4.8 oz (65.908 kg)  04/11/15 145 lb (65.772 kg)  03/22/15 141 lb (63.957 kg)  03/16/15 142 lb (64.411 kg)  02/15/15 144 lb (65.318 kg)  12/28/14 141 lb (63.957 kg)  12/09/14 140 lb 1.6 oz (63.549 kg)  11/09/14 141 lb (63.957 kg)  10/26/14 141 lb (63.957 kg)    Body mass index is 23.66 kg/(m^2). Patient meets criteria for normal weight based on current BMI.   Current diet order is 2 gm sodium, patient is consuming approximately 100% of meals at this time. Labs and medications reviewed.   No nutrition interventions warranted at this time. If nutrition issues arise, please consult RD.   Molli Barrows, RD, LDN, [place] Pager [phone] After Hours Pager [phone]

## 2015-08-23 NOTE — Progress Notes (Signed)
About 1900 day shift RN and night shift RN into patient's room to complete shift change bedside report and patient sleeping.  RN entered room to patient's sounding bed alarm at about 1935.  Patient was sitting on side of bed.  Patient then stood and RN asked patient if she needed to go to the bathroom and patient replied no.  RN asked patient where she was going and patient replied she was getting her shoes.  RN explained to patient that she was still at the hospital and she was going to be staying the night tonight so she didn't need to get her shoes.  Patient then asked if she needed to go to dinner and RN replied at the hospital your meals are brought to your room.  RN then assisted patient back into bed, call light within reach.  About ten minutes later RN asked patient orientation questions and patient told RN she was at [facility] but could not remember why she had to come to the hospital.  RN explained to patient why she was here at the hospital and patient stated understanding.

## 2015-08-23 NOTE — Progress Notes (Signed)
  Echocardiogram  2D Echocardiogram has been performed.  Darlina Sicilian M 08/23/2015, 2:05 PM

## 2015-08-23 NOTE — Progress Notes (Signed)
Patient Name: [name] Date of Encounter: 08/23/2015  Primary Cardiologist: Dr. Percival Spanish   Active Problems:   Unstable angina    SUBJECTIVE  Denies any SOB, had 9/10 chest pain this morning, better after nitro, now 5/10  CURRENT MEDS . aspirin EC  81 mg Oral Daily  . atorvastatin  20 mg Oral q1800  . brimonidine  1 drop Both Eyes BID  . brinzolamide  1 drop Both Eyes BID  . ezetimibe  10 mg Oral Daily  . fluticasone  1 spray Each Nare BID  . furosemide  40 mg Oral Daily  . latanoprost  1 drop Both Eyes QHS  . levothyroxine  50 mcg Oral QAC breakfast  . lisinopril  10 mg Oral BID  . loratadine  10 mg Oral Daily  . multivitamin with minerals  1 tablet Oral Daily  . pantoprazole  80 mg Oral Daily  . pindolol  2.5 mg Oral Daily  . pneumococcal 23 valent vaccine  0.5 mL Intramuscular Tomorrow-1000  . potassium chloride  20 mEq Oral BID  . sodium chloride  3 mL Intravenous Q12H    OBJECTIVE  Filed Vitals:   08/23/15 0000 08/23/15 0400 08/23/15 0821 08/23/15 0835  BP: 112/71 122/83 122/81 114/79  Pulse: 103 110    Temp: 98.3 F (36.8 C) 98.3 F (36.8 C)    TempSrc: Oral Oral    Resp: 18 18    Height:      Weight:  146 lb 8 oz (66.452 kg)    SpO2: 98% 96%      Intake/Output Summary (Last 24 hours) at 08/23/15 0848 Last data filed at 08/22/15 2000  Gross per 24 hour  Intake    730 ml  Output      0 ml  Net    730 ml   Filed Weights   08/22/15 1955 08/23/15 0400  Weight: 146 lb 8 oz (66.452 kg) 146 lb 8 oz (66.452 kg)    PHYSICAL EXAM  General: Pleasant, NAD. Neuro: Alert and oriented X 3. Moves all extremities spontaneously. Psych: Normal affect. HEENT:  Normal  Neck: Supple without bruits or JVD. Lungs:  Resp regular and unlabored, CTA. Heart: RRR ,  Soft systolic murmur, ? S 3  Abdomen: Soft, non-tender, non-distended, BS + x 4.  Extremities: No clubbing, cyanosis or edema. DP/PT/Radials 2+ and equal bilaterally.  Accessory Clinical  Findings  CBC  Recent Labs  08/22/15 1353 08/23/15 0706  WBC 12.4* 10.9*  NEUTROABS 9.8*  --   HGB 14.0 13.3  HCT 43.2 41.4  MCV 102.9* 101.7*  PLT 236 759   Basic Metabolic Panel  Recent Labs  08/22/15 1353 08/23/15 0706  NA 134* 135  K 4.5 4.0  CL 102 105  CO2 26 20*  GLUCOSE 123* 103*  BUN 25* 21*  CREATININE 1.26* 1.02*  CALCIUM 8.9 8.6*   Cardiac Enzymes  Recent Labs  08/22/15 2011 08/23/15 0135 08/23/15 0706  TROPONINI 0.05* 0.05* 0.05*   BNP Invalid input(s): POCBNP D-Dimer  Recent Labs  08/22/15 1905  DDIMER 1.42*    TELE A-fib with HR 100-110s    ECG  A-fib with LBBB  Echocardiogram 09/2014  LV EF: 50% -  55%  ------------------------------------------------------------------- Indications:   CHF - 428.0. Syncope 780.2.  ------------------------------------------------------------------- History:  PMH:  Chest pain. Coronary artery disease. Risk factors: Hypertension.  ------------------------------------------------------------------- Study Conclusions  - Left ventricle: Inferobasal hypokinesis. The cavity size was normal. Wall thickness was increased in a  pattern of moderate LVH. Systolic function was normal. The estimated ejection fraction was in the range of 50% to 55%. Doppler parameters are consistent with both elevated ventricular end-diastolic filling pressure and elevated left atrial filling pressure. - Aortic valve: There was mild stenosis. Valve area (VTI): 1.77 cm^2. Valve area (Vmax): 1.67 cm^2. Valve area (Vmean): 1.71 cm^2. - Mitral valve: Calcified annulus. Mildly thickened leaflets . Valve area by continuity equation (using LVOT flow): 2.52 cm^2. - Left atrium: The atrium was mildly dilated. - Right atrium: The atrium was mildly dilated. - Atrial septum: No defect or patent foramen ovale was identified. - Pericardium, extracardiac: A trivial pericardial effusion was identified  posterior to the heart. There was a left pleural effusion.     Radiology/Studies  Dg Chest 2 View  08/22/2015   CLINICAL DATA:  Epigastric pain, history of Coronary artery disease, CHF  EXAM: CHEST  2 VIEW  COMPARISON:  None in PACs  FINDINGS: The lungs are adequately inflated. There are small bilateral pleural effusions layering posteriorly. The cardiac silhouette is enlarged. The pulmonary vascularity is engorged. The pulmonary interstitial markings are increased bilaterally. The bony thorax exhibits no acute abnormality. There are degenerative changes at multiple thoracic disc levels and of both shoulders.  IMPRESSION: CHF with interstitial edema and small bilateral pleural effusions.   Electronically Signed   By: David  Martinique M.D.   On: 08/22/2015 13:29   Ct Angio Chest Pe W/cm &/or Wo Cm  08/23/2015   CLINICAL DATA:  81 year old female with chest pain. Shortness of breath worse with exertion. Elevated D-dimer.  EXAM: CT ANGIOGRAPHY CHEST WITH CONTRAST  TECHNIQUE: Multidetector CT imaging of the chest was performed using the standard protocol during bolus administration of intravenous contrast. Multiplanar CT image reconstructions and MIPs were obtained to evaluate the vascular anatomy.  CONTRAST:  86mL OMNIPAQUE IOHEXOL 350 MG/ML SOLN  COMPARISON:  Radiographs 1 day prior.  FINDINGS: There are no filling defects within the pulmonary arteries to suggest pulmonary embolus. Symmetric diminished perfusion to subsegmental branches of both lower lobes, likely related to decreased cardiac output.  Multi chamber cardiomegaly with contrast refluxing into the IVC. Dense coronary artery calcifications. Moderate atherosclerosis of the thoracic aorta which is tortuous in its course. Small bilateral pleural effusions, no pericardial effusion. Small mediastinal lymph nodes without pathologic adenopathy. No hilar adenopathy.  Compressive atelectasis in the lower lobes adjacent to pleural effusions. Ground-glass  opacities and perihilar aeration, concerning for pulmonary edema. No consolidation to suggest pneumonia. No pulmonary mass or suspicious nodule.  No acute abnormality in the included upper abdomen.  There are no acute or suspicious osseous abnormalities. Age-related degenerative change in the thoracic spine. Advanced degenerative change of both shoulders.  Review of the MIP images confirms the above findings.  IMPRESSION: 1. No pulmonary embolus. 2. Findings consistent with CHF with multi chamber cardiomegaly, bilateral pleural effusions, pulmonary edema, and contrast refluxing into the IVC. 3. Coronary artery calcifications. Atherosclerosis of thoracic aorta.   Electronically Signed   By: Jeb Levering M.D.   On: 08/23/2015 01:39    ASSESSMENT AND PLAN  81 yo female with PMH of CAD s/p unsuccessful PCI in 2010, chronic atrial fibrillation, diastolic HF, HTN, HLD, hypothyroidism, and PSVT who presented to [facility] ED on 08/22/2015 for sharp chest pain. D-dimer was elevated, CTA negative for PE, however concerning for edema.   1. Unstable angina  - history of CAD with Cath in 2010 (unsuccessful PCI - chronic occlusion of LAD). Presenting today with sharp  sternal chest pain that woke her from sleep overnight. New LBBB noted on EKG three weeks ago.  - patient is DNR and does not wish to proceed with an invasive cardiac workup. Discussed with the patient again this morning, she reconfirmed that she did not wish invasive workup. No point of doing stress test when no plan for invasive therapy and known dx in coronary vessel  - start IV heparin. Pending echo to assess LV function. Hold ACEI, start IV nitro as chest pain worsened this morning.    2. Acute on chronic diastolic HF  - CT concerning for acute HF, lasix increased to 40mg  daily.   3. Persistent atrial fibrillation with HR 100-110  - CHA2DS2-Vasc score 5 (age, CAD, female, HTN)   - newly diagnosed in early August 2016.  - continue rate control with  Pindolol, will change to 2.5mg  as pindolol is a BID drug and HR is currently 100-110s. Not a candidate for anti-coagulation due to high risk factors.  4. HTN  5. HLD  Signed, Woodward Ku Pager: [phone]  Attending Note:   The patient was seen and examined.  Agree with assessment and plan as noted above.  Changes made to the above note as needed.  Pt is very elderly and very frail. Has ? S3 on exam ( too much noise in the room and respiratory noise  to tell for sure ) She does not want any cardiac invasive procedure - which I would agree with .  She is a very poor candidate for invasive procedure.  Echo is being done currently .     Thayer Headings, Brooke Bonito., MD, [facility] 08/23/2015, 1:20 PM [address]  [place] Pager [phone]

## 2015-08-23 NOTE — Clinical Social Work Note (Addendum)
Clinical Social Work Assessment  Patient Details  Name: [name] MRN: [id number] Date of Birth: [birth year]  Date of referral:  08/23/15               Reason for consult:  Facility Placement, Discharge Planning                Permission sought to share information with:  Facility Sport and exercise psychologist Permission granted to share information::  Yes, Verbal Permission Granted  Name::        Agency::  [facility]  Relationship::     Contact Information:     Housing/Transportation Living arrangements for the past 2 months:  [place] of Information:  Patient, Other (Comment Required) (Pt aide from facility) Patient Interpreter Needed:  None Criminal Activity/Legal Involvement Pertinent to Current Situation/Hospitalization:  No - Comment as needed Significant Relationships:  Adult Children Lives with:  Facility Resident Do you feel safe going back to the place where you live?  Yes Need for family participation in patient care:  No (Coment)  Care giving concerns:  Pt admitted from [facility]   Social Worker assessment / plan:  CSW visited room and spoke pt and pt hired CNA from facility. Pt has CNA assistance for morning routine until 1pm. Pt CNA reports that pt is mainly independent and is able to complete ADLs independently/with supervision. Pt plans to return to ALF at dc. CSW left voicemail for William Dalton [phone]) with facility to confirm pt will be okay to return when medically stable.  ADDENDUM: CSW received call back from facility. They confirmed pt is able to return to ALF.  Employment status:  Retired Forensic scientist:  Medicare PT Recommendations:  Not assessed at this time Information / Referral to community resources:   (None needed at this time)  Patient/Family's Response to care:  Pt wanting to return to ALF when medically stable.  Patient/Family's Understanding of and Emotional Response to Diagnosis, Current Treatment, and  Prognosis:  Pt has a fair understanding of condition. Pt with appropriate emotional response.  Emotional Assessment Appearance:  Appears stated age, Well-Groomed Attitude/Demeanor/Rapport:  Other (Cooperative) Affect (typically observed):  Calm Orientation:  Oriented to Self, Oriented to Place, Oriented to  Time, Oriented to Situation Alcohol / Substance use:  Not Applicable Psych involvement (Current and /or in the community):  No (Comment)  Discharge Needs  Concerns to be addressed:  Discharge Planning Concerns Readmission within the last 30 days:  No Current discharge risk:  None Barriers to Discharge:  Continued Medical Work up   [employer], [place]

## 2015-08-23 NOTE — Progress Notes (Signed)
ANTICOAGULATION CONSULT NOTE - Follow-up  Pharmacy Consult for Heparin Indication: r/o DVT  No Known Allergies  Patient Measurements: Height 65 inches Weight 69.4kg  Vital Signs: Temp: 98.3 F (36.8 C) (08/24 0400) Temp Source: Oral (08/24 0400) BP: 114/79 mmHg (08/24 0835) Pulse Rate: 110 (08/24 0400)  Labs:  Recent Labs  08/22/15 1353 08/22/15 2011 08/23/15 0135 08/23/15 0706  HGB 14.0  --   --  13.3  HCT 43.2  --   --  41.4  PLT 236  --   --  216  LABPROT  --  13.8  --   --   INR  --  1.04  --   --   HEPARINUNFRC  --   --  0.68 0.87*  CREATININE 1.26*  --   --  1.02*  TROPONINI  --  0.05* 0.05* 0.05*   Estimated Creatinine Clearance: 32.3 mL/min (by C-G formula based on Cr of 1.02).  Assessment: 81yo female continues on IV heparin for r/o DVT. CT of the chest was negative for PE. Heparin level is now supratherapeutic at 0.87. H/H + plts are WNL and no bleeding noted.   Goal of Therapy:  Heparin level 0.3-0.7 units/ml Monitor platelets by anticoagulation protocol: Yes   Plan:  - Reduce heparin gtt to 900 units/hr - Check an 8 hour heparin level - Continue daily heparin level and CBC  [name], PharmD, BCPS Pager # [phone] 08/23/2015 8:54 AM

## 2015-08-24 DIAGNOSIS — R0600 Dyspnea, unspecified: Secondary | ICD-10-CM

## 2015-08-24 DIAGNOSIS — Z515 Encounter for palliative care: Secondary | ICD-10-CM

## 2015-08-24 DIAGNOSIS — I5022 Chronic systolic (congestive) heart failure: Secondary | ICD-10-CM

## 2015-08-24 LAB — CBC
HCT: 40.6 % (ref 36.0–46.0)
HEMOGLOBIN: 12.9 g/dL (ref 12.0–15.0)
MCH: 32.4 pg (ref 26.0–34.0)
MCHC: 31.8 g/dL (ref 30.0–36.0)
MCV: 102 fL — ABNORMAL HIGH (ref 78.0–100.0)
Platelets: 203 10*3/uL (ref 150–400)
RBC: 3.98 MIL/uL (ref 3.87–5.11)
RDW: 14.5 % (ref 11.5–15.5)
WBC: 8.2 10*3/uL (ref 4.0–10.5)

## 2015-08-24 LAB — HEPARIN LEVEL (UNFRACTIONATED)
HEPARIN UNFRACTIONATED: 0.31 [IU]/mL (ref 0.30–0.70)
Heparin Unfractionated: 0.37 IU/mL (ref 0.30–0.70)

## 2015-08-24 MED ORDER — METOPROLOL TARTRATE 25 MG PO TABS
25.0000 mg | ORAL_TABLET | Freq: Two times a day (BID) | ORAL | Status: DC
  Administered 2015-08-24 – 2015-08-25 (×3): 25 mg via ORAL
  Filled 2015-08-24 (×2): qty 1

## 2015-08-24 MED ORDER — ISOSORBIDE MONONITRATE ER 30 MG PO TB24
15.0000 mg | ORAL_TABLET | Freq: Every day | ORAL | Status: DC
  Administered 2015-08-24 – 2015-08-25 (×2): 15 mg via ORAL
  Filled 2015-08-24 (×3): qty 1

## 2015-08-24 MED ORDER — MORPHINE SULFATE (CONCENTRATE) 10 MG/0.5ML PO SOLN: 10.0000 mg | ORAL | Status: DC | PRN

## 2015-08-24 NOTE — Progress Notes (Signed)
Patient Name: [name] Date of Encounter: 08/24/2015  Primary Cardiologist: Dr. Percival Spanish   Active Problems:   Unstable angina   Pain in the chest    SUBJECTIVE  Denies any SOB or CP last night  CURRENT MEDS . aspirin EC  81 mg Oral Daily  . atorvastatin  20 mg Oral q1800  . brimonidine  1 drop Both Eyes BID  . brinzolamide  1 drop Both Eyes BID  . ezetimibe  10 mg Oral Daily  . fluticasone  1 spray Each Nare BID  . furosemide  40 mg Oral Daily  . latanoprost  1 drop Both Eyes QHS  . levothyroxine  50 mcg Oral QAC breakfast  . lisinopril  10 mg Oral BID  . loratadine  10 mg Oral Daily  . multivitamin with minerals  1 tablet Oral Daily  . pantoprazole  80 mg Oral Daily  . pindolol  2.5 mg Oral BID  . potassium chloride  20 mEq Oral BID  . sodium chloride  3 mL Intravenous Q12H    OBJECTIVE  Filed Vitals:   08/23/15 2111 08/23/15 2311 08/24/15 0441 08/24/15 0617  BP: 114/82 126/79  108/86  Pulse: 104 110  103  Temp: 98.3 F (36.8 C)   98.4 F (36.9 C)  TempSrc: Oral   Oral  Resp: 18   20  Height:      Weight:   147 lb 9.6 oz (66.951 kg)   SpO2: 98%   99%    Intake/Output Summary (Last 24 hours) at 08/24/15 1043 Last data filed at 08/24/15 0700  Gross per 24 hour  Intake    492 ml  Output    425 ml  Net     67 ml   Filed Weights   08/22/15 1955 08/23/15 0400 08/24/15 0441  Weight: 146 lb 8 oz (66.452 kg) 146 lb 8 oz (66.452 kg) 147 lb 9.6 oz (66.951 kg)    PHYSICAL EXAM  General: Pleasant, NAD. Neuro: Alert and oriented X 3. Moves all extremities spontaneously. Psych: Normal affect. HEENT:  Normal  Neck: Supple without bruits or JVD. Lungs:  Resp regular and unlabored, CTA. Heart: RRR ,  Soft systolic murmur Abdomen: Soft, non-tender, non-distended, BS + x 4.  Extremities: No clubbing, cyanosis or edema. DP/PT/Radials 2+ and equal bilaterally.  Accessory Clinical Findings  CBC  Recent Labs  08/22/15 1353 08/23/15 0706 08/24/15 0630   WBC 12.4* 10.9* 8.2  NEUTROABS 9.8*  --   --   HGB 14.0 13.3 12.9  HCT 43.2 41.4 40.6  MCV 102.9* 101.7* 102.0*  PLT 236 216 858   Basic Metabolic Panel  Recent Labs  08/22/15 1353 08/23/15 0706  NA 134* 135  K 4.5 4.0  CL 102 105  CO2 26 20*  GLUCOSE 123* 103*  BUN 25* 21*  CREATININE 1.26* 1.02*  CALCIUM 8.9 8.6*   Cardiac Enzymes  Recent Labs  08/22/15 2011 08/23/15 0135 08/23/15 0706  TROPONINI 0.05* 0.05* 0.05*   BNP Invalid input(s): POCBNP D-Dimer  Recent Labs  08/22/15 1905  DDIMER 1.42*    TELE A-fib with HR 100-110s    ECG  A-fib with LBBB        Radiology/Studies  Dg Chest 2 View  08/22/2015   CLINICAL DATA:  Epigastric pain, history of Coronary artery disease, CHF  EXAM: CHEST  2 VIEW  COMPARISON:  None in PACs  FINDINGS: The lungs are adequately inflated. There are small bilateral pleural effusions layering  posteriorly. The cardiac silhouette is enlarged. The pulmonary vascularity is engorged. The pulmonary interstitial markings are increased bilaterally. The bony thorax exhibits no acute abnormality. There are degenerative changes at multiple thoracic disc levels and of both shoulders.  IMPRESSION: CHF with interstitial edema and small bilateral pleural effusions.   Electronically Signed   By: David  Martinique M.D.   On: 08/22/2015 13:29   Ct Angio Chest Pe W/cm &/or Wo Cm  08/23/2015   CLINICAL DATA:  81 year old female with chest pain. Shortness of breath worse with exertion. Elevated D-dimer.  EXAM: CT ANGIOGRAPHY CHEST WITH CONTRAST  TECHNIQUE: Multidetector CT imaging of the chest was performed using the standard protocol during bolus administration of intravenous contrast. Multiplanar CT image reconstructions and MIPs were obtained to evaluate the vascular anatomy.  CONTRAST:  84mL OMNIPAQUE IOHEXOL 350 MG/ML SOLN  COMPARISON:  Radiographs 1 day prior.  FINDINGS: There are no filling defects within the pulmonary arteries to suggest  pulmonary embolus. Symmetric diminished perfusion to subsegmental branches of both lower lobes, likely related to decreased cardiac output.  Multi chamber cardiomegaly with contrast refluxing into the IVC. Dense coronary artery calcifications. Moderate atherosclerosis of the thoracic aorta which is tortuous in its course. Small bilateral pleural effusions, no pericardial effusion. Small mediastinal lymph nodes without pathologic adenopathy. No hilar adenopathy.  Compressive atelectasis in the lower lobes adjacent to pleural effusions. Ground-glass opacities and perihilar aeration, concerning for pulmonary edema. No consolidation to suggest pneumonia. No pulmonary mass or suspicious nodule.  No acute abnormality in the included upper abdomen.  There are no acute or suspicious osseous abnormalities. Age-related degenerative change in the thoracic spine. Advanced degenerative change of both shoulders.  Review of the MIP images confirms the above findings.  IMPRESSION: 1. No pulmonary embolus. 2. Findings consistent with CHF with multi chamber cardiomegaly, bilateral pleural effusions, pulmonary edema, and contrast refluxing into the IVC. 3. Coronary artery calcifications. Atherosclerosis of thoracic aorta.   Electronically Signed   By: Jeb Levering M.D.   On: 08/23/2015 01:39    ASSESSMENT AND PLAN  81 yo female with PMH of CAD s/p unsuccessful PCI in 2010, chronic atrial fibrillation, diastolic HF, HTN, HLD, hypothyroidism, and PSVT who presented to [facility] ED on 08/22/2015 for sharp chest pain. D-dimer was elevated, CTA negative for PE, however concerning for edema.   1. Unstable angina  - history of CAD with Cath in 2010 (unsuccessful PCI - chronic occlusion of LAD). Presenting today with sharp sternal chest pain that woke her from sleep overnight. New LBBB noted on EKG three weeks ago.  - patient is DNR and does not wish to proceed with an invasive cardiac workup. Discussed with the patient again this  morning, she reconfirmed that she did not wish invasive workup. No point of doing stress test when no plan for invasive therapy and known dx in coronary vessel  - Echo 08/23/2015 EF 20-25%, diffuse hypokinesis. EF down from 50-55% since 09/2014. Family and patient still wish to avoid pursue invasive workup. Change IV nitro to PO Imdur. Consider change pindalol to coreg. Will discuss with MD regarding timing of discharge, given her multiple comorbidities, this maybe the best she will be.    2. Acute on chronic diastolic HF  - lasix increased to 40mg  daily, currently euvolemic. Will need BMET in 1 week.    3. Persistent atrial fibrillation with HR 100-110  - CHA2DS2-Vasc score 5 (age, CAD, female, HTN)   - newly diagnosed in early August 2016.  -  Not a candidate for anti-coagulation due to high risk factors.  Will change pindolol to metoprolol 25 bid  4. HTN  5. HLD  Signed, Woodward Ku Pager: [phone]   Attending Note:   The patient was seen and examined.  Agree with assessment and plan as noted above.  Changes made to the above note as needed.  Her echo shows significant reduction in LV function  -  i suspect from tachycardia I think we need to try low dose metoprolol again   I ve taked to patient and to daughter , [name]. They agree that we should get palliative care involved.  Will be able to DC possibly tomorrow    Thayer Headings, Brooke Bonito., MD, [facility] 08/24/2015, 11:32 AM [address]  [place] Pager [phone]

## 2015-08-24 NOTE — Care Management Note (Addendum)
Case Management Note  Patient Details  Name: [name] MRN: [id number] Date of Birth: 09/27/1920  Subjective/Objective: Pt admitted for unstable angina. Pt is from [facility]. Pt has a Biomedical engineer.                   Action/Plan: CSW assisting with disposition needs. CM to offer Hospice Choice to return to ALF. CM did speak with pt and daughter. Daughter is [name] and she can be reached at [phone]. Pt has DME: RW, transport chair, Hospital bed and 3n1. CM to make referral with [facility].   Expected Discharge Date:                  Expected Discharge Plan:  Assisted Living / Rest Home  In-House Referral:  Clinical Social Work  Discharge planning Services  CM Consult  Post Acute Care Choice:  NA Choice offered to:     DME Arranged:    DME Agency:     HH Arranged:   Registered Nurse [place]:   [facility] [place].   Status of Service:  In process, will continue to follow- Completed.   Medicare Important Message Given:    Date Medicare IM Given:    Medicare IM give by:    Date Additional Medicare IM Given:    Additional Medicare Important Message give by:     If discussed at [place] of Stay Meetings, dates discussed:    Additional Comments:  Bethena Roys, RN 08/24/2015, 2:22 PM

## 2015-08-24 NOTE — Progress Notes (Signed)
Notified by [name] [facility] of pt./family request for [facility] services at home after discharge.  Writer spoke with pt. And her daughter [name] at the bedside to initiate education related to hospice philosophy, services and team approach to care. Both voiced understanding of information provided. Per discussion plan is for discharge to home by personal vehicle tomorrow.  Please send signed and completed DNR form home with pt./family. Pt. Will need prescriptions for discharge comfort medications.  DME needs discussed and [name] reports she has all the DME needed for pt. who lives at [facility] in the AL unit.  [facility] aware of above. Please notify [facility] when pt. Is ready to leave unit at discharge- call [phone]. [facility] information and contact numbers have been given to [place] during visit. Please call with any questions.  [facility] Liaison [phone]

## 2015-08-24 NOTE — Consult Note (Signed)
Consultation Note Date: 08/24/2015   Patient Name: [name]  DOB: 01/28/1920  MRN: [id number]  Age / Sex: 81 y.o., female   PCP: Gayland Curry, DO Referring Physician: Thayer Headings, MD  Reason for Consultation: Establishing goals of care  Palliative Care Assessment and Plan Summary of Established Goals of Care and Medical Treatment Preferences  81 yo female with PMH of CAD s/p unsuccessful PCI in 2010, chronic atrial fibrillation, diastolic HF, HTN, HLD, hypothyroidism, and PSVT who presented to [facility] ED on 08/22/2015 for sharp chest pain. D-dimer was elevated, CTA negative for PE, however concerning for edema. Patient was admitted to the hospital with a working diagnosis of Unstable Angina, ECHO showed EF 20-25% and diffuse hypokinesis. Patient has been seen and evaluated by cardiology, medical management is being pursued. A palliative consult has been placed for further discussions.   The patient is a pleasant elderly lady resting in bed. She is in no distress. She denies chest pain or dyspnea. She states she lives in an [place]. The patient's daughter [name] arrived shortly behind me. The patient's husband died in [place] on palliative service on 6N around last christmas. The patient is aware of him receiving Morphine and other comfort measures.   Discussed with patient and her daughter about her current cardiac condition, her ECHO etc. Discussed scope of palliative medicine. Patient wishes to not come back to the hospital. The patient wishes to go back to her ALF. Discussed adding an extra layer of support with hospice services following up with the patient at her assisted living. Also discussed adding low dose Roxanol PO PRN for symptom management of her chest pain and dyspnea.   The patient and her daughter are agreeable to the above. Discussed with care manager on the unit about hospice consult.  Thank you for the consult.    Contacts/Participants in Discussion: Primary  Decision Maker:  Patient, then her daughter [name].    HCPOA: yes   daughter [name].   Code Status/Advance Care Planning:  DNR DNI  Symptom Management:   Add low dose PO Roxanol for dyspnea or chest pain PRN  Palliative Prophylaxis: yes  Additional Recommendations (Limitations, Scope, Preferences):  D/C to ALF with hospice  Do not re hospitalize  Psycho-social/Spiritual:   Support System: strong, daughter lives nearby and checks on the patient daily at her ALF  Desire for further Chaplaincy support:no  Prognosis: weeks to months  Discharge Planning:  ALF with hospice.    Values: comfort care, do not re hospitalize. Life limiting illness: unstable angina, possible CAD       Chief Complaint/History of Present Illness: chest pain.   Primary Diagnoses  Present on Admission:  . Unstable angina  Palliative Review of Systems: Completed.  I have reviewed the medical record, interviewed the patient and family, and examined the patient. The following aspects are pertinent.  Past Medical History  Diagnosis Date  . SVT (supraventricular tachycardia)     Diagnosed 2010  . HTN (hypertension)   . Dyslipidemia   . CAD (coronary artery disease)     a. NSTEMI 2010 in setting of SVT, with cath with unsuccessful PCI - chronic total occlusion of LAD, R->L collaterals.  . GI bleeding   . Unspecified hypothyroidism   . Unspecified hereditary and idiopathic peripheral neuropathy   . Pneumonia, organism unspecified   . Basal cell carcinoma of skin of trunk, except scrotum   . Unspecified malignant neoplasm of skin of lower limb, including hip   .  Basal cell carcinoma of skin of lower limb, including hip   . Unspecified glaucoma     left eye  . Other premature beats   . Other esophagitis   . Esophageal reflux   . Osteoarthrosis, unspecified whether generalized or localized, unspecified site     multiple joints  . Pain in joint, shoulder region   . Pain in joint, lower leg      knee  . Pain in joint, ankle and foot     right  . Sacroiliitis, not elsewhere classified   . Cervicalgia   . Lumbago   . Ganglion of tendon sheath   . Pathologic fracture of vertebrae   . Tietze's disease   . Insomnia, unspecified   . Disturbance of skin sensation   . Edema   . Palpitations   . Other nonspecific abnormal serum enzyme levels   . Nasal bones, closed fracture   . Closed fracture of unspecified trochanteric section of femur   . Open wound of knee, leg (except thigh), and ankle, without mention of complication   . Hip, thigh, leg, and ankle, abrasion or friction burn, without mention of infection   . Contusion of hip   . Fall from other slipping, tripping, or stumbling   . Personal history of fall   . Dyspepsia and other specified disorders of function of stomach   . Paroxysmal supraventricular tachycardia   . Acute bronchiolitis due to other infectious organisms 03/02/2013  . Other abnormal blood chemistry 03/15/2013  . Contusion of rib on left side 08/03/2013  . Hypertension   . Coronary artery disease   . Glaucoma    Social History   Social History  . Marital Status: Widowed    Spouse Name: N/A  . Number of Children: N/A  . Years of Education: N/A   Social History Main Topics  . Smoking status: Never Smoker   . Smokeless tobacco: Never Used  . Alcohol Use: No     Comment: 10/06/2014 "drinks a scotch or wine every other day; nothing in the last 4-5 months"  . Drug Use: No  . Sexual Activity: No   Other Topics Concern  . None   Social History Narrative   ** Merged History Encounter **       Patient is Married x 2(1st husband died age 58 , MI) Widowed husband died 30-Dec-2014;  Occupation: House wife.  Lives in single level home, Independent Living  section at [place] since 2008, [place] to [place] 02/15/2015. has caregiver assistance in the 7am-1pm, 5-9,pm 11pm-7am   No Smoking history , Minimal Alcohol history   POA, DNR, MOST    Exercise: PT 3 x week   Social/acitives daily                Family History  Problem Relation Age of Onset  . Stroke Neg Hx   . Heart attack Neg Hx   . Heart disease Father   . Cancer Brother     lung   Scheduled Meds: . aspirin EC  81 mg Oral Daily  . atorvastatin  20 mg Oral q1800  . brimonidine  1 drop Both Eyes BID  . brinzolamide  1 drop Both Eyes BID  . ezetimibe  10 mg Oral Daily  . fluticasone  1 spray Each Nare BID  . furosemide  40 mg Oral Daily  . isosorbide mononitrate  15 mg Oral Daily  . latanoprost  1 drop Both Eyes QHS  . levothyroxine  50 mcg Oral QAC breakfast  . lisinopril  10 mg Oral BID  . loratadine  10 mg Oral Daily  . metoprolol tartrate  25 mg Oral BID  . multivitamin with minerals  1 tablet Oral Daily  . pantoprazole  80 mg Oral Daily  . potassium chloride  20 mEq Oral BID  . sodium chloride  3 mL Intravenous Q12H   Continuous Infusions: . heparin 800 Units/hr (08/23/15 1810)   PRN Meds:.sodium chloride, acetaminophen, morphine CONCENTRATE, nitroGLYCERIN, ondansetron (ZOFRAN) IV, sodium chloride Medications Prior to Admission:  Prior to Admission medications   Medication Sig Start Date End Date Taking? Authorizing Provider  acetaminophen (TYLENOL) 500 MG tablet Take 1,000 mg by mouth 2 (two) times daily.    Yes Historical Provider, MD  alum & mag hydroxide-simeth (MAALOX/MYLANTA) 200-200-20 MG/5ML suspension Take 15 mLs by mouth every 4 (four) hours as needed for indigestion or heartburn.   Yes Historical Provider, MD  amoxicillin (AMOXIL) 500 MG tablet Take 2,000 mg by mouth once as needed (Dental appointment). Take one hour prior to dental appointment   Yes Historical Provider, MD  aspirin EC 81 MG tablet Take 81 mg by mouth every evening.   Yes Historical Provider, MD  atorvastatin (LIPITOR) 20 MG tablet TAKE 1 TABLET BY MOUTH DAILY TO LOWER LIPIDS 01/30/15  Yes Lauree Chandler, NP  Brinzolamide-Brimonidine [facility]) 1-0.2 % SUSP Place 1  drop into both eyes 2 (two) times daily.    Yes Historical Provider, MD  ezetimibe (ZETIA) 10 MG tablet Take 10 mg by mouth daily.   Yes Historical Provider, MD  fluticasone (FLONASE) 50 MCG/ACT nasal spray Place 1 spray into both nostrils 2 (two) times daily.   Yes Historical Provider, MD  furosemide (LASIX) 20 MG tablet Take 20 mg by mouth daily.    Yes Historical Provider, MD  latanoprost (XALATAN) 0.005 % ophthalmic solution Place 1 drop into both eyes every evening. UAD   Yes Historical Provider, MD  levothyroxine (SYNTHROID, LEVOTHROID) 50 MCG tablet TAKE 1 TABLET BY MOUTH ONCE DAILY 02/20/15  Yes Tiffany L Reed, DO  lisinopril (PRINIVIL,ZESTRIL) 10 MG tablet Take 1 tablet (10 mg total) by mouth 2 (two) times daily. 10/14/14  Yes Minus Breeding, MD  loratadine (CLARITIN) 10 MG tablet Take 10 mg by mouth daily.   Yes Historical Provider, MD  Melatonin 5 MG TABS Take 1 tablet by mouth at bedtime as needed (insomnia).    Yes Historical Provider, MD  Multiple Vitamin (MULTIVITAMIN WITH MINERALS) TABS Take 1 tablet by mouth daily. Certavite-A   Yes Historical Provider, MD  Multiple Vitamins-Minerals (PRESERVISION/LUTEIN) CAPS Take 1 capsule by mouth 2 (two) times daily.    Yes Historical Provider, MD  omeprazole (PRILOSEC) 20 MG capsule Take 40 mg by mouth daily.    Yes Historical Provider, MD  pindolol (VISKEN) 5 MG tablet Take 2.5 mg by mouth daily.   Yes Historical Provider, MD  Potassium Chloride ER 20 MEQ TBCR Take 20 mEq by mouth 2 (two) times daily.    Yes Historical Provider, MD  trolamine salicylate (ASPERCREME) 10 % cream Apply 1 application topically 3 (three) times daily as needed for muscle pain.    Yes Historical Provider, MD  HYDROcodone-acetaminophen (NORCO) 5-325 MG per tablet Take 1 tablet by mouth every 6 (six) hours as needed for severe pain. Patient not taking: Reported on 08/22/2015 02/10/15   Sherwood Gambler, MD   No Known Allergies CBC:    Component Value Date/Time   WBC  8.2 08/24/2015 0630   WBC 8.9 03/23/2015   HGB 12.9 08/24/2015 0630   HCT 40.6 08/24/2015 0630   PLT 203 08/24/2015 0630   MCV 102.0* 08/24/2015 0630   NEUTROABS 9.8* 08/22/2015 1353   LYMPHSABS 1.4 08/22/2015 1353   MONOABS 1.2* 08/22/2015 1353   EOSABS 0.0 08/22/2015 1353   BASOSABS 0.0 08/22/2015 1353   Comprehensive Metabolic Panel:    Component Value Date/Time   NA 135 08/23/2015 0706   NA 134* 08/07/2015   K 4.0 08/23/2015 0706   CL 105 08/23/2015 0706   CO2 20* 08/23/2015 0706   BUN 21* 08/23/2015 0706   BUN 29* 08/07/2015   CREATININE 1.02* 08/23/2015 0706   CREATININE 1.1 08/07/2015   CREATININE 0.95 10/14/2014 1138   GLUCOSE 103* 08/23/2015 0706   CALCIUM 8.6* 08/23/2015 0706   AST 17 03/23/2015   ALT 14 03/23/2015   ALKPHOS 157* 03/23/2015   BILITOT 0.6 10/06/2014 1635   PROT 7.7 10/06/2014 1635   ALBUMIN 3.2* 10/06/2014 1635    Physical Exam: Vital Signs: BP 110/72 mmHg  Pulse 102  Temp(Src) 98.6 F (37 C) (Oral)  Resp 20  Ht 5\' 6"  (1.676 m)  Wt 66.951 kg (147 lb 9.6 oz)  BMI 23.83 kg/m2  SpO2 97% SpO2: SpO2: 97 % O2 Device: O2 Device: Not Delivered O2 Flow Rate:   Intake/output summary:  Intake/Output Summary (Last 24 hours) at 08/24/15 1506 Last data filed at 08/24/15 0700  Gross per 24 hour  Intake    492 ml  Output    425 ml  Net     67 ml   LBM: Last BM Date: 08/23/15 Baseline Weight: Weight: 66.452 kg (146 lb 8 oz) Most recent weight: Weight: 66.951 kg (147 lb 9.6 oz)  Exam Findings:  NAD No chest pain currently Clear S1S2 Abdomen soft No edema Non focal                        Palliative Performance Scale: 40% Additional Data Reviewed: Recent Labs     08/22/15  1353  08/23/15  0706  08/24/15  0630  WBC  12.4*  10.9*  8.2  HGB  14.0  13.3  12.9  PLT  236  216  203  NA  134*  135   --   BUN  25*  21*   --   CREATININE  1.26*  1.02*   --      Time In: 1400 Time Out: 1455 Time Total: 55 min  Greater than 50%  of  this time was spent counseling and coordinating care related to the above assessment and plan.  Signed by: Loistine Chance, MD [place], MD  08/24/2015, 3:06 PM  Please contact Palliative Medicine Team phone at [phone] for questions and concerns.

## 2015-08-24 NOTE — Progress Notes (Signed)
ANTICOAGULATION CONSULT NOTE - Follow-up  Pharmacy Consult for Heparin Indication: r/o DVT  No Known Allergies  Patient Measurements: Height 65 inches Weight 69.4kg  Vital Signs: Temp: 98.6 F (37 C) (08/25 1422) Temp Source: Oral (08/25 1422) BP: 110/72 mmHg (08/25 1422) Pulse Rate: 102 (08/25 1422)  Labs:  Recent Labs  08/22/15 1353 08/22/15 2011  08/23/15 0135 08/23/15 0706 08/23/15 1716 08/24/15 0620 08/24/15 0630  HGB 14.0  --   --   --  13.3  --   --  12.9  HCT 43.2  --   --   --  41.4  --   --  40.6  PLT 236  --   --   --  216  --   --  203  LABPROT  --  13.8  --   --   --   --   --   --   INR  --  1.04  --   --   --   --   --   --   HEPARINUNFRC  --   --   < > 0.68 0.87* 0.71* 0.31  --   CREATININE 1.26*  --   --   --  1.02*  --   --   --   TROPONINI  --  0.05*  --  0.05* 0.05*  --   --   --   < > = values in this interval not displayed. Estimated Creatinine Clearance: 32.3 mL/min (by C-G formula based on Cr of 1.02).  Assessment: 81yo female continues on IV heparin for r/o DVT. CT of the chest was negative for PE. Heparin level is now therapeutic at 0.31. H&H and plts are WNL and no bleeding noted.  Goal of Therapy:  Heparin level 0.3-0.7 units/ml Monitor platelets by anticoagulation protocol: Yes   Plan:  - Continue heparin gtt at 800 units/hr - Monitor daily heparin level, CBC, and s/sx of bleeding - F/u dopplers  Drucie Opitz, PharmD Clinical Pharmacist Pager: [phone] 08/24/2015 3:31 PM

## 2015-08-25 ENCOUNTER — Encounter (HOSPITAL_COMMUNITY): Payer: Self-pay

## 2015-08-25 DIAGNOSIS — I25110 Atherosclerotic heart disease of native coronary artery with unstable angina pectoris: Secondary | ICD-10-CM | POA: Diagnosis not present

## 2015-08-25 LAB — CBC
HEMATOCRIT: 40.3 % (ref 36.0–46.0)
HEMOGLOBIN: 12.8 g/dL (ref 12.0–15.0)
MCH: 32.7 pg (ref 26.0–34.0)
MCHC: 31.8 g/dL (ref 30.0–36.0)
MCV: 102.8 fL — ABNORMAL HIGH (ref 78.0–100.0)
Platelets: 243 10*3/uL (ref 150–400)
RBC: 3.92 MIL/uL (ref 3.87–5.11)
RDW: 14.5 % (ref 11.5–15.5)
WBC: 8.1 10*3/uL (ref 4.0–10.5)

## 2015-08-25 MED ORDER — INFLUENZA VAC SPLIT QUAD 0.5 ML IM SUSY
0.5000 mL | PREFILLED_SYRINGE | Freq: Once | INTRAMUSCULAR | Status: AC
  Administered 2015-08-25: 0.5 mL via INTRAMUSCULAR
  Filled 2015-08-25: qty 0.5

## 2015-08-25 MED ORDER — NITROGLYCERIN 0.4 MG SL SUBL: 0.4000 mg | SUBLINGUAL_TABLET | SUBLINGUAL | Status: AC | PRN

## 2015-08-25 MED ORDER — INFLUENZA VAC SPLIT QUAD 0.5 ML IM SUSY: 0.5000 mL | PREFILLED_SYRINGE | INTRAMUSCULAR | Status: DC

## 2015-08-25 MED ORDER — FUROSEMIDE 40 MG PO TABS: 40.0000 mg | ORAL_TABLET | Freq: Every day | ORAL | Status: AC

## 2015-08-25 MED ORDER — METOPROLOL TARTRATE 25 MG PO TABS: 25.0000 mg | ORAL_TABLET | Freq: Two times a day (BID) | ORAL | Status: AC

## 2015-08-25 MED ORDER — ISOSORBIDE MONONITRATE ER 30 MG PO TB24: 15.0000 mg | ORAL_TABLET | Freq: Every day | ORAL | Status: AC

## 2015-08-25 NOTE — Clinical Social Work Note (Signed)
Clinical Social Worker facilitated patient discharge including contacting patient family and facility to confirm patient discharge plans.  Clinical information faxed to facility and family agreeable with plan.  Patient's dtr, [name] at bedside and want to transport patient to [facility].  RN to call report prior to discharge to [phone].  DC packet prepared and on chart for transport.   Clinical Social Worker will sign off for now as social work intervention is no longer needed. Please consult [place] again if new need arises.  Glendon Axe, MSW, LCSWA [phone] 08/25/2015 1:29 PM

## 2015-08-25 NOTE — Care Management Important Message (Signed)
Important Message  Patient Details  Name: [name] MRN: [id number] Date of Birth: [birth year]   Medicare Important Message Given:  Yes-second notification given    Delrae Sawyers, RN 08/25/2015, 11:06 AM

## 2015-08-25 NOTE — Discharge Summary (Signed)
Discharge Summary   [name],  MRN: [id number], DOB/AGE: 79-May-1921 79 y.o.  Admit date: 08/22/2015 Discharge date: 08/25/2015  Primary Care Provider: Hollace Kinnier Primary Cardiologist: Dr. Percival Spanish  Discharge Diagnoses Principal Problem:   Pain in the chest Active Problems:   Hyperlipidemia   CORONARY ATHEROSCLEROSIS NATIVE CORONARY ARTERY   Hypothyroidism   HTN (hypertension)   LBBB (left bundle branch block)   Atrial fibrillation, unspecified   Encounter for palliative care   Allergies No Known Allergies  Procedures  Echocardiogram 08/23/2015 LV EF: 20% -  25%  ------------------------------------------------------------------- Indications:   CHF - 428.0.  ------------------------------------------------------------------- History:  PMH:  Coronary artery disease. Risk factors: Hypertension. Dyslipidemia.  ------------------------------------------------------------------- Study Conclusions  - Left ventricle: The cavity size was normal. There was mild concentric hypertrophy. Systolic function was severely reduced. The estimated ejection fraction was in the range of 20% to 25%. Diffuse hypokinesis. The study is not technically sufficient to allow evaluation of LV diastolic function. - Aortic valve: Trileaflet. Sclerosis without stenosis. Peak gradient (S): 10 mm Hg. - Mitral valve: Calcified leaflets. There was mild regurgitation. - Left atrium: The atrium was moderately dilated at 40 ml/m2. - Right ventricle: The cavity size was moderately dilated. Systolic function is reduced. - Right atrium: The atrium was mildly dilated. - Tricuspid valve: There was mild regurgitation. - Pulmonary arteries: PA peak pressure: 28 mm Hg (S). - Systemic veins: The IVC measures <2.1 cm, but does not collapse >50%, suggesting an elevated RA pressure of 8 mmHg. - Pericardium, extracardiac: There was a left pleural  effusion.  Impressions:  - Compared to the prior study in 09/2014, the LVEF has dropped to 20-25% with global hypokinesis. No mural thrombus is noted with definity contrast. There is aortic sclerosis, but no stenosis (as previously reported). A left pleural effusion is present.     Hospital Course  The patient is a 81 year old Caucasian female Caucasian female with past medical history of (Cath 2010 w/ unsuccessful PCI - chronic occlusion of LAD), chronic atrial Fibrillation (rate controlled only), Diastolic HF, HTN, HLD, HTN, Hypothyroidism and PSVT who presented to [facility] ED Via EMS on 08/22/15 for sharp chest pain that started at 3:00AM on 08/22/2015. She reports the pain woke her up from sleep. Her last echocardiogram in October 2015 showed EF 50-55%. Her symptom was concerning for unstable angina, she was admitted to cardiology service. She was felt to have some fluid overload and BNP was elevated 463 on arrival. Chest x-ray showed interstitial edema. Her Lasix was increased to 40 mg daily. Overnight her serial troponin were flat at 0.053. Her d-dimer was elevated at 1.42. CTA of the chest was obtained in the morning of 8/24 which showed no PE, findings consistent with heart failure with multichamber cardiomegaly, bilateral pleural effusion, pulmonary edema and the contrast refluxing into the IVC.  Her chest pain worsened overnight, and she was placed on IV nitroglycerin. Extensive conversation has been held between cardiology team with multiple providers and the family along with patient. We both agree that given her advanced age and comorbidities, invasive workup would not be beneficial in this case. Her heart rate was also uncontrolled, she was initially on pindolol 2.5 mg daily, this was changed to 2.5 mg twice a day and was eventually changed to metoprolol 25 mg twice a day. Echocardiogram was done on 08/23/2015 which showed EF has been down from previous normal range to 20-25%, mild MR, moderately dilated  RV, left pleural effusion. This echo result was discussed with  both the patient and daughter, patient wishes to go back to her independent living facility. However given her symptom and that severely low EF, family and patient agree to obtain a palliative care consult prior to discharge.  She was seen by palliative care team on 8/25, after discussing current medical conditions, her echo result, and her wishes, patient wishes to go back to her assisted living facility with hospice support. She was seen in the morning of 08/25/2015, at which time she denies any significant shortness of breath or chest discomfort, she is deemed stable for discharge from cardiology perspective. She will follow-up with her primary physician. She can contact cardiology team for follow-up on as-needed basis. She has a DO NOT RESUSCITATE form from 82. FL2 form signed.   Normally after increase lasix, patient usually will need BMET to check renal function in 1 week to avoid over diuresis. However since the patient is currently on hospice, we will leave followup lab at the discretion of her primary care service.     Discharge Vitals Blood pressure 132/65, pulse 102, temperature 97.5 F (36.4 C), temperature source Oral, resp. rate 16, height 5\' 6"  (1.676 m), weight 146 lb 12.8 oz (66.588 kg), SpO2 99 %.  Filed Weights   08/23/15 0400 08/24/15 0441 08/25/15 0439  Weight: 146 lb 8 oz (66.452 kg) 147 lb 9.6 oz (66.951 kg) 146 lb 12.8 oz (66.588 kg)    Labs  CBC  Recent Labs  08/22/15 1353  08/24/15 0630 08/25/15 0446  WBC 12.4*  < > 8.2 8.1  NEUTROABS 9.8*  --   --   --   HGB 14.0  < > 12.9 12.8  HCT 43.2  < > 40.6 40.3  MCV 102.9*  < > 102.0* 102.8*  PLT 236  < > 203 243  < > = values in this interval not displayed. Basic Metabolic Panel  Recent Labs  08/22/15 1353 08/23/15 0706  NA 134* 135  K 4.5 4.0  CL 102 105  CO2 26 20*  GLUCOSE 123* 103*  BUN 25* 21*  CREATININE 1.26* 1.02*  CALCIUM 8.9 8.6*     Cardiac Enzymes  Recent Labs  08/22/15 2011 08/23/15 0135 08/23/15 0706  TROPONINI 0.05* 0.05* 0.05*   BNP Invalid input(s): POCBNP D-Dimer  Recent Labs  08/22/15 1905  DDIMER 1.42*    Disposition  Pt is being discharged home today in good condition.  Follow-up Plans & Appointments      Follow-up Information    Follow up with Hospice at [facility].   Specialty:  Hospice and Palliative Medicine   Why:  Registered Nurse.    Contact information:   [place] [address] [phone]       Follow up with REED, TIFFANY, DO.   Specialty:  Geriatric Medicine   Why:  Please followup with your primary care physician    Contact information:   [place]. [place] [address] [phone]       Follow up with Minus Breeding, MD.   Specialty:  Cardiology   Why:  Please call cardiology as needed.    Contact information:   [address] [place] [address] [phone]       Discharge Medications    Medication List    STOP taking these medications        amoxicillin 500 MG tablet  Commonly known as:  AMOXIL     pindolol 5 MG tablet  Commonly known as:  VISKEN  TAKE these medications        acetaminophen 500 MG tablet  Commonly known as:  TYLENOL  Take 1,000 mg by mouth 2 (two) times daily.     alum & mag hydroxide-simeth 200-200-20 MG/5ML suspension  Commonly known as:  MAALOX/MYLANTA  Take 15 mLs by mouth every 4 (four) hours as needed for indigestion or heartburn.     aspirin EC 81 MG tablet  Take 81 mg by mouth every evening.     atorvastatin 20 MG tablet  Commonly known as:  LIPITOR  TAKE 1 TABLET BY MOUTH DAILY TO LOWER LIPIDS     ezetimibe 10 MG tablet  Commonly known as:  ZETIA  Take 10 mg by mouth daily.     fluticasone 50 MCG/ACT nasal spray  Commonly known as:  FLONASE  Place 1 spray into both nostrils 2 (two) times daily.     furosemide 40 MG tablet  Commonly known as:  LASIX   Take 1 tablet (40 mg total) by mouth daily.     HYDROcodone-acetaminophen 5-325 MG per tablet  Commonly known as:  NORCO  Take 1 tablet by mouth every 6 (six) hours as needed for severe pain.     isosorbide mononitrate 30 MG 24 hr tablet  Commonly known as:  IMDUR  Take 0.5 tablets (15 mg total) by mouth daily.     latanoprost 0.005 % ophthalmic solution  Commonly known as:  XALATAN  Place 1 drop into both eyes every evening. UAD     levothyroxine 50 MCG tablet  Commonly known as:  SYNTHROID, LEVOTHROID  TAKE 1 TABLET BY MOUTH ONCE DAILY     lisinopril 10 MG tablet  Commonly known as:  PRINIVIL,ZESTRIL  Take 1 tablet (10 mg total) by mouth 2 (two) times daily.     loratadine 10 MG tablet  Commonly known as:  CLARITIN  Take 10 mg by mouth daily.     Melatonin 5 MG Tabs  Take 1 tablet by mouth at bedtime as needed (insomnia).     metoprolol tartrate 25 MG tablet  Commonly known as:  LOPRESSOR  Take 1 tablet (25 mg total) by mouth 2 (two) times daily.     multivitamin with minerals Tabs tablet  Take 1 tablet by mouth daily. Certavite-A     nitroGLYCERIN 0.4 MG SL tablet  Commonly known as:  NITROSTAT  Place 1 tablet (0.4 mg total) under the tongue every 5 (five) minutes as needed for chest pain.     omeprazole 20 MG capsule  Commonly known as:  PRILOSEC  Take 40 mg by mouth daily.     Potassium Chloride ER 20 MEQ Tbcr  Take 20 mEq by mouth 2 (two) times daily.     PRESERVISION/LUTEIN Caps  Take 1 capsule by mouth 2 (two) times daily.     SIMBRINZA 1-0.2 % Susp  Generic drug:  Brinzolamide-Brimonidine  Place 1 drop into both eyes 2 (two) times daily.     trolamine salicylate 10 % cream  Commonly known as:  ASPERCREME  Apply 1 application topically 3 (three) times daily as needed for muscle pain.        Outstanding Labs/Studies  Possible BMET at the discretion of PCP  Duration of Discharge Encounter   Greater than 30 minutes including physician  time.  Hilbert Corrigan PA-C Pager: [phone] 08/25/2015, 12:38 PM   Attending Note:   The patient was seen and examined.  Agree with assessment and plan as noted above.  Changes made to the above note as needed.  Pt has been discharged to home with hospice and palliative care. She has significant CHF .  Unfortunately, we do not have much to offer from a cardiac standpoint   Thayer Headings, Brooke Bonito., MD, [facility] 08/25/2015, 5:39 PM [address],  [place] Pager [phone]

## 2015-08-25 NOTE — Progress Notes (Signed)
Patient Name: [name] Date of Encounter: 08/25/2015  Primary Cardiologist: Dr. Percival Spanish   Active Problems:   Unstable angina   Pain in the chest   Encounter for palliative care    SUBJECTIVE  Denies any SOB or CP last night  CURRENT MEDS . aspirin EC  81 mg Oral Daily  . atorvastatin  20 mg Oral q1800  . brimonidine  1 drop Both Eyes BID  . brinzolamide  1 drop Both Eyes BID  . ezetimibe  10 mg Oral Daily  . fluticasone  1 spray Each Nare BID  . furosemide  40 mg Oral Daily  . isosorbide mononitrate  15 mg Oral Daily  . latanoprost  1 drop Both Eyes QHS  . levothyroxine  50 mcg Oral QAC breakfast  . lisinopril  10 mg Oral BID  . loratadine  10 mg Oral Daily  . metoprolol tartrate  25 mg Oral BID  . multivitamin with minerals  1 tablet Oral Daily  . pantoprazole  80 mg Oral Daily  . potassium chloride  20 mEq Oral BID  . sodium chloride  3 mL Intravenous Q12H    OBJECTIVE  Filed Vitals:   08/24/15 1422 08/25/15 0439 08/25/15 0445 08/25/15 0830  BP: 110/72  131/91 132/65  Pulse: 102  102 102  Temp: 98.6 F (37 C)  97.5 F (36.4 C)   TempSrc: Oral  Oral   Resp:   16   Height:      Weight:  146 lb 12.8 oz (66.588 kg)    SpO2: 97%  99%     Intake/Output Summary (Last 24 hours) at 08/25/15 0840 Last data filed at 08/25/15 0700  Gross per 24 hour  Intake    336 ml  Output    800 ml  Net   -464 ml   Filed Weights   08/23/15 0400 08/24/15 0441 08/25/15 0439  Weight: 146 lb 8 oz (66.452 kg) 147 lb 9.6 oz (66.951 kg) 146 lb 12.8 oz (66.588 kg)    PHYSICAL EXAM  General: Pleasant, NAD. Neuro: Alert and oriented X 3. Moves all extremities spontaneously. Psych: Normal affect. HEENT:  Normal  Neck: Supple without bruits or JVD. Lungs:  Resp regular and unlabored, CTA. Heart: slightly tachycardiac,  Soft systolic murmur Abdomen: Soft, non-tender, non-distended, BS + x 4.  Extremities: No clubbing, cyanosis or edema. DP/PT/Radials 2+ and equal  bilaterally.  Accessory Clinical Findings  CBC  Recent Labs  08/22/15 1353  08/24/15 0630 08/25/15 0446  WBC 12.4*  < > 8.2 8.1  NEUTROABS 9.8*  --   --   --   HGB 14.0  < > 12.9 12.8  HCT 43.2  < > 40.6 40.3  MCV 102.9*  < > 102.0* 102.8*  PLT 236  < > 203 243  < > = values in this interval not displayed. Basic Metabolic Panel  Recent Labs  08/22/15 1353 08/23/15 0706  NA 134* 135  K 4.5 4.0  CL 102 105  CO2 26 20*  GLUCOSE 123* 103*  BUN 25* 21*  CREATININE 1.26* 1.02*  CALCIUM 8.9 8.6*   Cardiac Enzymes  Recent Labs  08/22/15 2011 08/23/15 0135 08/23/15 0706  TROPONINI 0.05* 0.05* 0.05*   BNP Invalid input(s): POCBNP D-Dimer  Recent Labs  08/22/15 1905  DDIMER 1.42*    TELE A-fib with HR high 90s to low 100s    ECG  A-fib with LBBB   Echocardiogram 08/23/2015 LV EF: 20% -  25%  ------------------------------------------------------------------- Indications:   CHF - 428.0.  ------------------------------------------------------------------- History:  PMH:  Coronary artery disease. Risk factors: Hypertension. Dyslipidemia.  ------------------------------------------------------------------- Study Conclusions  - Left ventricle: The cavity size was normal. There was mild concentric hypertrophy. Systolic function was severely reduced. The estimated ejection fraction was in the range of 20% to 25%. Diffuse hypokinesis. The study is not technically sufficient to allow evaluation of LV diastolic function. - Aortic valve: Trileaflet. Sclerosis without stenosis. Peak gradient (S): 10 mm Hg. - Mitral valve: Calcified leaflets. There was mild regurgitation. - Left atrium: The atrium was moderately dilated at 40 ml/m2. - Right ventricle: The cavity size was moderately dilated. Systolic function is reduced. - Right atrium: The atrium was mildly dilated. - Tricuspid valve: There was mild regurgitation. - Pulmonary arteries:  PA peak pressure: 28 mm Hg (S). - Systemic veins: The IVC measures <2.1 cm, but does not collapse >50%, suggesting an elevated RA pressure of 8 mmHg. - Pericardium, extracardiac: There was a left pleural effusion.  Impressions:  - Compared to the prior study in 09/2014, the LVEF has dropped to 20-25% with global hypokinesis. No mural thrombus is noted with definity contrast. There is aortic sclerosis, but no stenosis (as previously reported). A left pleural effusion is present.     Radiology/Studies  Dg Chest 2 View  08/22/2015   CLINICAL DATA:  Epigastric pain, history of Coronary artery disease, CHF  EXAM: CHEST  2 VIEW  COMPARISON:  None in PACs  FINDINGS: The lungs are adequately inflated. There are small bilateral pleural effusions layering posteriorly. The cardiac silhouette is enlarged. The pulmonary vascularity is engorged. The pulmonary interstitial markings are increased bilaterally. The bony thorax exhibits no acute abnormality. There are degenerative changes at multiple thoracic disc levels and of both shoulders.  IMPRESSION: CHF with interstitial edema and small bilateral pleural effusions.   Electronically Signed   By: David  Martinique M.D.   On: 08/22/2015 13:29   Ct Angio Chest Pe W/cm &/or Wo Cm  08/23/2015   CLINICAL DATA:  81 year old female with chest pain. Shortness of breath worse with exertion. Elevated D-dimer.  EXAM: CT ANGIOGRAPHY CHEST WITH CONTRAST  TECHNIQUE: Multidetector CT imaging of the chest was performed using the standard protocol during bolus administration of intravenous contrast. Multiplanar CT image reconstructions and MIPs were obtained to evaluate the vascular anatomy.  CONTRAST:  69mL OMNIPAQUE IOHEXOL 350 MG/ML SOLN  COMPARISON:  Radiographs 1 day prior.  FINDINGS: There are no filling defects within the pulmonary arteries to suggest pulmonary embolus. Symmetric diminished perfusion to subsegmental branches of both lower lobes, likely related to  decreased cardiac output.  Multi chamber cardiomegaly with contrast refluxing into the IVC. Dense coronary artery calcifications. Moderate atherosclerosis of the thoracic aorta which is tortuous in its course. Small bilateral pleural effusions, no pericardial effusion. Small mediastinal lymph nodes without pathologic adenopathy. No hilar adenopathy.  Compressive atelectasis in the lower lobes adjacent to pleural effusions. Ground-glass opacities and perihilar aeration, concerning for pulmonary edema. No consolidation to suggest pneumonia. No pulmonary mass or suspicious nodule.  No acute abnormality in the included upper abdomen.  There are no acute or suspicious osseous abnormalities. Age-related degenerative change in the thoracic spine. Advanced degenerative change of both shoulders.  Review of the MIP images confirms the above findings.  IMPRESSION: 1. No pulmonary embolus. 2. Findings consistent with CHF with multi chamber cardiomegaly, bilateral pleural effusions, pulmonary edema, and contrast refluxing into the IVC. 3. Coronary artery calcifications. Atherosclerosis of thoracic  aorta.   Electronically Signed   By: Jeb Levering M.D.   On: 08/23/2015 01:39    ASSESSMENT AND PLAN  81 yo female with PMH of CAD s/p unsuccessful PCI in 2010, chronic atrial fibrillation, diastolic HF, HTN, HLD, hypothyroidism, and PSVT who presented to [facility] ED on 08/22/2015 for sharp chest pain. D-dimer was elevated, CTA negative for PE, however concerning for edema.   1. Unstable angina  - history of CAD with Cath in 2010 (unsuccessful PCI - chronic occlusion of LAD). Presenting today with sharp sternal chest pain that woke her from sleep overnight. New LBBB noted on EKG three weeks ago.  - patient is DNR and does not wish to proceed with an invasive cardiac workup. Discussed with the patient again this morning, she reconfirmed that she did not wish invasive workup. No point of doing stress test when no plan for invasive  therapy and known dx in coronary vessel  - Echo 08/23/2015 EF 20-25%, diffuse hypokinesis. EF down from 50-55% since 09/2014. Family and patient still wish to avoid pursue invasive workup. Imdur added.  - palliative care consulted yesterday +/- hospice care, family agreeable. Euvolemic today, HR high 90s to low 100s. Stable for discharge from cardiac perspective.    2. Acute on chronic diastolic HF  - lasix increased to 40mg  daily, currently euvolemic. May need BMET in 1 week depend on if considering palliative care vs hospice   3. Persistent atrial fibrillation with HR 100-110  - CHA2DS2-Vasc score 5 (age, CAD, female, HTN)   - newly diagnosed in early August 2016.  -  Not a candidate for anti-coagulation due to high risk factors.  - pindolol changed to metoprolol 25 bid  4. HTN  5. HLD  Signed, Woodward Ku Pager: [phone]  Attending Note:   The patient was seen and examined.  Agree with assessment and plan as noted above.  Changes made to the above note as needed.  Agree with plans for palliative care. She may follow up with her primary care doctor  And see [place] as neede.d    Ramond Dial., MD, [facility] 08/25/2015, 9:11 AM [address],  [place] Pager [phone]

## 2015-08-25 NOTE — Discharge Instructions (Signed)

## 2015-08-28 ENCOUNTER — Telehealth: Payer: Self-pay | Admitting: Cardiology

## 2015-08-28 NOTE — Telephone Encounter (Signed)
Close encounter 

## 2015-08-29 ENCOUNTER — Encounter: Payer: Self-pay | Admitting: Internal Medicine

## 2015-08-29 ENCOUNTER — Non-Acute Institutional Stay: Payer: Medicare Other | Admitting: Internal Medicine

## 2015-08-29 DIAGNOSIS — I4891 Unspecified atrial fibrillation: Secondary | ICD-10-CM

## 2015-08-29 DIAGNOSIS — R351 Nocturia: Secondary | ICD-10-CM

## 2015-08-29 DIAGNOSIS — R0981 Nasal congestion: Secondary | ICD-10-CM | POA: Diagnosis not present

## 2015-08-29 DIAGNOSIS — I25110 Atherosclerotic heart disease of native coronary artery with unstable angina pectoris: Secondary | ICD-10-CM

## 2015-08-29 DIAGNOSIS — Z66 Do not resuscitate: Secondary | ICD-10-CM

## 2015-08-29 DIAGNOSIS — R413 Other amnesia: Secondary | ICD-10-CM | POA: Diagnosis not present

## 2015-08-29 DIAGNOSIS — I5043 Acute on chronic combined systolic (congestive) and diastolic (congestive) heart failure: Secondary | ICD-10-CM

## 2015-08-29 LAB — BASIC METABOLIC PANEL
BUN: 28 mg/dL — AB (ref 4–21)
CREATININE: 1.2 mg/dL — AB (ref 0.5–1.1)
GLUCOSE: 103 mg/dL
POTASSIUM: 4.6 mmol/L (ref 3.4–5.3)
Sodium: 139 mmol/L (ref 137–147)

## 2015-08-29 NOTE — Progress Notes (Signed)
[name], female   DOB: [birth year], 81 y.o.   MRN: [id number]  Provider:  Rexene Edison. Mariea Clonts, D.O., C.M.D. Location:  [facility]  PCP: Hollace Kinnier, DO  Code Status: DNR Goals of Care: Advanced Directive information Does patient have an advance directive?: Yes, Type of Advance Directive: [place];Out of facility DNR (pink MOST or yellow form), Pre-existing out of facility DNR order (yellow form or pink MOST form): Yellow form placed in chart (order not valid for inpatient use);Pink MOST form placed in chart (order not valid for inpatient use)   Chief Complaint  Patient presents with  . Readmit to AL    following hospitalization 08/22/15 to 08/25/15 for chest pain    HPI: 81 y.o. female with h/o CAD with chronically occluded LAD (unsuccessful PCI on cath in 2010 for NSTEMI), recent difficulty with acute diastolic chf, SVT and now recent afib, mild cognitive impairment, senile osteoporosis, nocturia and sinus congestion was seen upon readmission to AL s/p hospitalization for acute chest pain.  Hospital records were reviewed in detail.  She had been sent out 8/23 with chest pain radiating through to her back, dyspnea that had begun 8 hrs earlier in the middle of the night.  She had an abnormal EKG but wound up in afib by the time she reached the ED.  She underwent an echo 8/24 which revealed an EF of 20-25% which was a significant reduction, diffuse hypokinesis, left pleural effusion.  Her BNP was slightly elevated at 463.  She also had interstitial edema on CXR.  Her lasix was increased to 40mg  daily.  Serial troponins were unremarkable.  D dimer was elevated at 1.42 so CTA was performed which was negative for PE, but again showed chf with bilateral pleural effusions, pulmonary edema.  Her heart rate remained elevated and pindolol was initially increased to twice daily, but did not help so this was later changed to metoprolol (perhaps due to added chf benefit, as  well) 25mg  po bid.  A palliative care consult was ordered and Ellyn and her family decided she did not want any invasive investigations given the chronic nature of her condition, her frail state and age of 47.  She returns to [place] [facility] for her CHF, CAD.  A f/u bmp was ordered at admission and reviewed and stable.  Would try to avoid further labwork for comfort purposes.  When seen, she was resting comfortably in her recliner chair in a quiet room.  She denied chest pain, shortness of breath, wheezing.  She says she feels considerably better and looks relaxed.   Heart rate is still elevated.  Spoke with nursing, and they had no new concerns about her at this time.  ROS: Review of Systems  Constitutional: Positive for malaise/fatigue. Negative for fever and chills.  HENT: Negative for congestion.   Respiratory: Negative for cough and shortness of breath.   Cardiovascular: Negative for chest pain, palpitations, claudication, leg swelling and PND.  Gastrointestinal: Positive for constipation. Negative for abdominal pain, blood in stool and melena.  Genitourinary: Positive for frequency. Negative for dysuria.  Musculoskeletal: Negative for falls.  Skin: Negative for itching and rash.  Neurological: Positive for tingling, sensory change and weakness. Negative for dizziness and loss of consciousness.  Endo/Heme/Allergies: Bruises/bleeds easily.  Psychiatric/Behavioral: Positive for memory loss.    Past Medical History  Diagnosis Date  . SVT (supraventricular tachycardia)     Diagnosed 2010  . HTN (  hypertension)   . Dyslipidemia   . CAD (coronary artery disease)     a. NSTEMI 2010 in setting of SVT, with cath with unsuccessful PCI - chronic total occlusion of LAD, R->L collaterals.  . GI bleeding   . Unspecified hypothyroidism   . Unspecified hereditary and idiopathic peripheral neuropathy   . Pneumonia, organism unspecified   . Basal cell carcinoma of  skin of trunk, except scrotum   . Unspecified malignant neoplasm of skin of lower limb, including hip   . Basal cell carcinoma of skin of lower limb, including hip   . Unspecified glaucoma     left eye  . Other premature beats   . Other esophagitis   . Esophageal reflux   . Osteoarthrosis, unspecified whether generalized or localized, unspecified site     multiple joints  . Pain in joint, shoulder region   . Pain in joint, lower leg     knee  . Pain in joint, ankle and foot     right  . Sacroiliitis, not elsewhere classified   . Cervicalgia   . Lumbago   . Ganglion of tendon sheath   . Pathologic fracture of vertebrae   . Tietze's disease   . Insomnia, unspecified   . Disturbance of skin sensation   . Edema   . Palpitations   . Other nonspecific abnormal serum enzyme levels   . Nasal bones, closed fracture   . Closed fracture of unspecified trochanteric section of femur   . Open wound of knee, leg (except thigh), and ankle, without mention of complication   . Hip, thigh, leg, and ankle, abrasion or friction burn, without mention of infection   . Contusion of hip   . Fall from other slipping, tripping, or stumbling   . Personal history of fall   . Dyspepsia and other specified disorders of function of stomach   . Paroxysmal supraventricular tachycardia   . Acute bronchiolitis due to other infectious organisms 03/02/2013  . Other abnormal blood chemistry 03/15/2013  . Contusion of rib on left side 08/03/2013  . Hypertension   . Coronary artery disease   . Glaucoma    Past Surgical History  Procedure Laterality Date  . Total hip arthroplasty Left 2001  . Breast lumpectomy Right 1995     negative for malignancy  . Cataract extraction w/ intraocular lens  implant, bilateral Bilateral 2003  . Leg skin lesion  biopsy / excision Left 08/22/2010    Lower leg shave biopsy -solar keratosis (Dr. Radford Pax)  . Joint replacement    . Eye surgery    . Pars plana vitrectomy Right  04/25/2015    Procedure: PARS PLANA VITRECTOMY WITH 25G REMOVAL/SUTURE INTRAOCULAR LENS; HEADSCOPE LASER;GAS FLUID EXCHANGE;  Surgeon: Hayden Pedro, MD;  Location: [facility];  Service: Ophthalmology;  Laterality: Right;   Social History:   reports that she has never smoked. She has never used smokeless tobacco. She reports that she does not drink alcohol or use illicit drugs.  Family History  Problem Relation Age of Onset  . Stroke Neg Hx   . Heart attack Neg Hx   . Heart disease Father   . Cancer Brother     lung    No Known Allergies  Medications: Patient's Medications  New Prescriptions   No medications on file  Previous Medications   ACETAMINOPHEN (TYLENOL) 500 MG TABLET    Take 1,000 mg by mouth 2 (two) times daily.    ALUM & MAG HYDROXIDE-SIMETH (MAALOX/MYLANTA)  200-200-20 MG/5ML SUSPENSION    Take 15 mLs by mouth every 4 (four) hours as needed for indigestion or heartburn.   ASPIRIN EC 81 MG TABLET    Take 81 mg by mouth every evening.   ATORVASTATIN (LIPITOR) 20 MG TABLET    TAKE 1 TABLET BY MOUTH DAILY TO LOWER LIPIDS   BRINZOLAMIDE-BRIMONIDINE (SIMBRINZA) 1-0.2 % SUSP    Place 1 drop into both eyes 2 (two) times daily.    EZETIMIBE (ZETIA) 10 MG TABLET    Take 10 mg by mouth daily.   FLUTICASONE (FLONASE) 50 MCG/ACT NASAL SPRAY    Place 1 spray into both nostrils 2 (two) times daily.   FUROSEMIDE (LASIX) 40 MG TABLET    Take 1 tablet (40 mg total) by mouth daily.   HYDROCODONE-ACETAMINOPHEN (NORCO) 5-325 MG PER TABLET    Take 1 tablet by mouth every 6 (six) hours as needed for severe pain.   ISOSORBIDE MONONITRATE (IMDUR) 30 MG 24 HR TABLET    Take 0.5 tablets (15 mg total) by mouth daily.   LATANOPROST (XALATAN) 0.005 % OPHTHALMIC SOLUTION    Place 1 drop into both eyes every evening. UAD   LEVOTHYROXINE (SYNTHROID, LEVOTHROID) 50 MCG TABLET    TAKE 1 TABLET BY MOUTH ONCE DAILY   LISINOPRIL (PRINIVIL,ZESTRIL) 10 MG TABLET    Take 1 tablet (10 mg total) by mouth 2 (two)  times daily.   LORATADINE (CLARITIN) 10 MG TABLET    Take 10 mg by mouth daily.   MELATONIN 5 MG TABS    Take 1 tablet by mouth at bedtime as needed (insomnia).    METOPROLOL TARTRATE (LOPRESSOR) 25 MG TABLET    Take 1 tablet (25 mg total) by mouth 2 (two) times daily.   MULTIPLE VITAMIN (MULTIVITAMIN WITH MINERALS) TABS    Take 1 tablet by mouth daily. Certavite-A   MULTIPLE VITAMINS-MINERALS (PRESERVISION/LUTEIN) CAPS    Take 1 capsule by mouth 2 (two) times daily.    NITROGLYCERIN (NITROSTAT) 0.4 MG SL TABLET    Place 1 tablet (0.4 mg total) under the tongue every 5 (five) minutes as needed for chest pain.   OMEPRAZOLE (PRILOSEC) 20 MG CAPSULE    Take 40 mg by mouth daily.    POTASSIUM CHLORIDE ER 20 MEQ TBCR    Take 20 mEq by mouth 2 (two) times daily.    TROLAMINE SALICYLATE (ASPERCREME) 10 % CREAM    Apply 1 application topically 3 (three) times daily as needed for muscle pain.   Modified Medications   No medications on file  Discontinued Medications   No medications on file     Physical Exam: Filed Vitals:   08/29/15 1455  BP: 111/81  Pulse: 79  Temp: 97.7 F (36.5 C)  Resp: 22  Weight: 150 lb (68.04 kg)  SpO2: 98%   Body mass index is 24.22 kg/(m^2). Physical Exam  Constitutional: She appears well-developed and well-nourished. No distress.  Thin white female  HENT:  Head: Normocephalic and atraumatic.  Right Ear: External ear normal.  Left Ear: External ear normal.  Nose: Nose normal.  Mouth/Throat: Oropharynx is clear and moist. No oropharyngeal exudate.  Eyes: Conjunctivae and EOM are normal. Pupils are equal, round, and reactive to light.  Neck: Normal range of motion. Neck supple. No JVD present. No thyromegaly present.  Cardiovascular: Intact distal pulses.   irreg irreg and still tachy when I listened, but asymptomatic  Pulmonary/Chest: Effort normal and breath sounds normal. She has no rales.  Abdominal:  Soft. Bowel sounds are normal. She exhibits no  distension. There is no tenderness.  Musculoskeletal: Normal range of motion.  Neurological: She is alert.  Skin: Skin is warm and dry.  Suntanned (just came in from sitting outside a little while, too)  Psychiatric: She has a normal mood and affect.     Labs reviewed: Basic Metabolic Panel:  Recent Labs  04/18/15 1141  08/22/15 1353 08/23/15 0706 08/29/15  NA 139  < > 134* 135 139  K 4.1  < > 4.5 4.0 4.6  CL 106  --  102 105  --   CO2 27  --  26 20*  --   GLUCOSE 126*  --  123* 103*  --   BUN 24*  < > 25* 21* 28*  CREATININE 1.00  < > 1.26* 1.02* 1.2*  CALCIUM 8.7  --  8.9 8.6*  --   < > = values in this interval not displayed. Liver Function Tests:  Recent Labs  10/06/14 1635 03/23/15  AST 34 17  ALT 29 14  ALKPHOS 137* 157*  BILITOT 0.6  --   PROT 7.7  --   ALBUMIN 3.2*  --    No results for input(s): LIPASE, AMYLASE in the last 8760 hours. No results for input(s): AMMONIA in the last 8760 hours. CBC:  Recent Labs  09/08/14 1230 10/06/14 1635  08/22/15 1353 08/23/15 0706 08/24/15 0630 08/25/15 0446  WBC 10.4 7.8  < > 12.4* 10.9* 8.2 8.1  NEUTROABS 7.6 5.0  --  9.8*  --   --   --   HGB 14.0 13.2  < > 14.0 13.3 12.9 12.8  HCT 42.4 40.2  < > 43.2 41.4 40.6 40.3  MCV 98.6 98.0  < > 102.9* 101.7* 102.0* 102.8*  PLT 182 232  < > 236 216 203 243  < > = values in this interval not displayed. Cardiac Enzymes:  Recent Labs  09/02/14 1136  08/22/15 2011 08/23/15 0135 08/23/15 0706  CKTOTAL 112  --   --   --   --   TROPONINI <0.30  < > 0.05* 0.05* 0.05*  < > = values in this interval not displayed. BNP: Invalid input(s): POCBNP  Lab Results  Component Value Date   HGBA1C 5.7 12/21/2013   Lab Results  Component Value Date   TSH 1.56 03/23/2015   No results found for: VITAMINB12 No results found for: FOLATE No results found for: IRON, TIBC, FERRITIN  Imaging and Procedures obtained prior to SNF admission: See hpi where hospital imaging and  studies reviewed, labs above.  Patient Care Team: Gayland Curry, DO as PCP - General (Geriatric Medicine) Imogene Burn, PA-C as Physician Assistant (Cardiology) Mardene Celeste, NP as Nurse Practitioner (Geriatric Medicine) [facility] Minus Breeding, MD as Consulting Physician (Cardiology) Estill Dooms, MD (Internal Medicine)  Assessment/Plan 1. Coronary artery disease involving native coronary artery of native heart with unstable angina pectoris -has occluded LAD -is now under hospice care due to her terminal state of her CAD and CHF--she and her family did not want invasive testing and interventions at this point -she is currently comfortable and pain-free  2. Advance directive indicates patient wish for do-not-resuscitate status - DNR (Do Not Resuscitate)  3. Acute on chronic combined systolic and diastolic heart failure - seems this acute episode is resolved with increased diuretic and adjustment of beta blocker  4. Atrial fibrillation, unspecified -tachycardia persists--has 4-5 different tachycardia diagnoses in chart,  but last said to be afib with RVR -she is asymptomatic, comfortable  5. Memory loss -has some cognitive impairment which had contributed to her AL placement along with her weakness and increased fall risk after her rehab stay  6. Sinus congestion -she did not complain of her left nostril being blocked for me today and has completed her short course of neosynephrine -is now back on flonase and claritin only  7. Nocturia -related to diuretics and is now on lasix 40mg  but just once a day so this should help (bid dosing seemed to be worsening nocturia at her last appt)  Functional status:  Requires help with bathing, dressing, grooming, able to feed herself and needs assistance with transfers  Family/ staff Communication: discussed with AL nurses and nurse manager  Labs/tests ordered:  No new labs; BMP done upon arrival was  reviewed and stable  Meara Wiechman L. Bryson Palen, D.O. [facility] [address], [place] [address] Cell Phone (Mon-Fri 8am-5pm):  [phone] On Call:  [phone] & follow prompts after 5pm & weekends Office Phone:  [phone] Office Fax:  [phone]

## 2015-08-31 ENCOUNTER — Encounter: Payer: Self-pay | Admitting: Physician Assistant

## 2015-08-31 ENCOUNTER — Ambulatory Visit (INDEPENDENT_AMBULATORY_CARE_PROVIDER_SITE_OTHER): Payer: Medicare Other | Admitting: Physician Assistant

## 2015-08-31 VITALS — BP 120/70 | HR 109 | Ht 66.0 in | Wt 149.9 lb

## 2015-08-31 DIAGNOSIS — I495 Sick sinus syndrome: Secondary | ICD-10-CM

## 2015-08-31 DIAGNOSIS — I2510 Atherosclerotic heart disease of native coronary artery without angina pectoris: Secondary | ICD-10-CM | POA: Diagnosis not present

## 2015-08-31 NOTE — Patient Instructions (Signed)
Lab Bmet  Your physician recommends that you schedule a follow-up appointment with Dr.Hochrein in 2 months

## 2015-08-31 NOTE — Progress Notes (Signed)
[name], female   DOB: 12-14-1920, 81 y.o.   MRN: [id number]    Date:  08/31/2015   ID:  [name], DOB 1920/03/01, MRN [id number]  PCP:  Hollace Kinnier, DO  Primary Cardiologist:  Hochrein   Chief Complaint  Patient presents with  . Follow-up  . Shortness of Breath    after walking for a while     History of Present Illness: [name] is a 81 y.o. female with past medical history of (Cath 2010 w/ unsuccessful PCI - chronic occlusion of LAD), chronic atrial Fibrillation (rate controlled only), Diastolic HF, HTN, HLD, HTN, Hypothyroidism and PSVT who [facility] on 08/22/15 for sharp chest pain that started at 3:00AM on 08/22/2015. She reports the pain woke her up from sleep. Her last echocardiogram in October 2015 showed EF 50-55%.  She was felt to have some fluid overload and BNP was elevated 463 on arrival. Chest x-ray showed interstitial edema. Her Lasix was increased to 40 mg daily. Overnight her serial troponin were flat at 0.053. Her d-dimer was elevated at 1.42. CTA of the chest was obtained in the morning of 8/24 which showed no PE, findings consistent with heart failure with multichamber cardiomegaly, bilateral pleural effusion, pulmonary edema and the contrast refluxing into the IVC.  Her chest pain worsened overnight, and she was placed on IV nitroglycerin. Extensive conversation has been held between cardiology team with multiple providers and the family along with patient. We both agree that given her advanced age and comorbidities, invasive workup would not be beneficial in this case. Her heart rate was also uncontrolled, she was initially on pindolol 2.5 mg daily, this was changed to 2.5 mg twice a day and was eventually changed to metoprolol 25 mg twice a day. Echocardiogram was done on 08/23/2015 which showed EF has been down from previous normal range to 20-25%, mild MR, moderately dilated RV, left pleural effusion. This echo result was  discussed with both the patient and daughter, patient wished to go back to her independent living facility. However given her symptom and that severely low EF, family and patient agree to obtain a palliative care consult prior to discharge.  She was seen by palliative care team on 8/25, after discussing current medical conditions, her echo result, and her wishes, patient wishes to go back to her assisted living facility with hospice support.  She has a DO NOT RESUSCITATE form from 62.    She is here for post-hospital follow-up. She denies any orthopnea, lower extremity edema, dizziness  As well as nausea, vomiting, fever, chest pain, shortness of breath, PND, cough, congestion, abdominal pain, hematochezia, melena.   She has been working with physical therapy and has been able to get up and go to the bathroom during the night.  Wt Readings from Last 3 Encounters:  08/31/15 149 lb 14.4 oz (67.994 kg)  08/29/15 150 lb (68.04 kg)  08/25/15 146 lb 12.8 oz (66.588 kg)     Past Medical History  Diagnosis Date  . SVT (supraventricular tachycardia)     Diagnosed 2010  . HTN (hypertension)   . Dyslipidemia   . CAD (coronary artery disease)     a. NSTEMI 2010 in setting of SVT, with cath with unsuccessful PCI - chronic total occlusion of LAD, R->L collaterals.  . GI bleeding   . Unspecified hypothyroidism   . Unspecified hereditary and idiopathic peripheral neuropathy   . Pneumonia, organism unspecified   .  Basal cell carcinoma of skin of trunk, except scrotum   . Unspecified malignant neoplasm of skin of lower limb, including hip   . Basal cell carcinoma of skin of lower limb, including hip   . Unspecified glaucoma     left eye  . Other premature beats   . Other esophagitis   . Esophageal reflux   . Osteoarthrosis, unspecified whether generalized or localized, unspecified site     multiple joints  . Pain in joint, shoulder region   . Pain in joint, lower leg     knee  . Pain in joint,  ankle and foot     right  . Sacroiliitis, not elsewhere classified   . Cervicalgia   . Lumbago   . Ganglion of tendon sheath   . Pathologic fracture of vertebrae   . Tietze's disease   . Insomnia, unspecified   . Disturbance of skin sensation   . Edema   . Palpitations   . Other nonspecific abnormal serum enzyme levels   . Nasal bones, closed fracture   . Closed fracture of unspecified trochanteric section of femur   . Open wound of knee, leg (except thigh), and ankle, without mention of complication   . Hip, thigh, leg, and ankle, abrasion or friction burn, without mention of infection   . Contusion of hip   . Fall from other slipping, tripping, or stumbling   . Personal history of fall   . Dyspepsia and other specified disorders of function of stomach   . Paroxysmal supraventricular tachycardia   . Acute bronchiolitis due to other infectious organisms 03/02/2013  . Other abnormal blood chemistry 03/15/2013  . Contusion of rib on left side 08/03/2013  . Hypertension   . Coronary artery disease   . Glaucoma     Current Outpatient Prescriptions  Medication Sig Dispense Refill  . acetaminophen (TYLENOL) 500 MG tablet Take 1,000 mg by mouth 2 (two) times daily.     Marland Kitchen alum & mag hydroxide-simeth (MAALOX/MYLANTA) 200-200-20 MG/5ML suspension Take 15 mLs by mouth every 4 (four) hours as needed for indigestion or heartburn.    Marland Kitchen aspirin EC 81 MG tablet Take 81 mg by mouth every evening.    Marland Kitchen atorvastatin (LIPITOR) 20 MG tablet TAKE 1 TABLET BY MOUTH DAILY TO LOWER LIPIDS 90 tablet 0  . Brinzolamide-Brimonidine (SIMBRINZA) 1-0.2 % SUSP Place 1 drop into both eyes 2 (two) times daily.     Marland Kitchen ezetimibe (ZETIA) 10 MG tablet Take 10 mg by mouth daily.    . fluticasone (FLONASE) 50 MCG/ACT nasal spray Place 1 spray into both nostrils 2 (two) times daily.    . furosemide (LASIX) 40 MG tablet Take 1 tablet (40 mg total) by mouth daily. 30 tablet 3  . HYDROcodone-acetaminophen (NORCO) 5-325 MG  per tablet Take 1 tablet by mouth every 6 (six) hours as needed for severe pain. 15 tablet 0  . imipramine (TOFRANIL) 50 MG tablet Take 50 mg by mouth at bedtime.    . isosorbide mononitrate (IMDUR) 30 MG 24 hr tablet Take 0.5 tablets (15 mg total) by mouth daily. 30 tablet 5  . latanoprost (XALATAN) 0.005 % ophthalmic solution Place 1 drop into both eyes every evening. UAD    . levothyroxine (SYNTHROID, LEVOTHROID) 50 MCG tablet TAKE 1 TABLET BY MOUTH ONCE DAILY 90 tablet 0  . lisinopril (PRINIVIL,ZESTRIL) 10 MG tablet Take 1 tablet (10 mg total) by mouth 2 (two) times daily. 180 tablet 3  . loratadine (CLARITIN) 10  MG tablet Take 10 mg by mouth daily.    . Melatonin 5 MG TABS Take 1 tablet by mouth at bedtime as needed (insomnia).     . metoprolol tartrate (LOPRESSOR) 25 MG tablet Take 1 tablet (25 mg total) by mouth 2 (two) times daily. 60 tablet 5  . Multiple Vitamin (MULTIVITAMIN WITH MINERALS) TABS Take 1 tablet by mouth daily. Certavite-A    . Multiple Vitamins-Minerals (PRESERVISION/LUTEIN) CAPS Take 1 capsule by mouth 2 (two) times daily.     . nitroGLYCERIN (NITROSTAT) 0.4 MG SL tablet Place 1 tablet (0.4 mg total) under the tongue every 5 (five) minutes as needed for chest pain. 25 tablet 3  . omeprazole (PRILOSEC) 20 MG capsule Take 40 mg by mouth daily.     . Potassium Chloride ER 20 MEQ TBCR Take 20 mEq by mouth 2 (two) times daily.     Marland Kitchen trolamine salicylate (ASPERCREME) 10 % cream Apply 1 application topically 3 (three) times daily as needed for muscle pain.      No current facility-administered medications for this visit.    Allergies:   No Known Allergies  Social History:  The patient  reports that she has never smoked. She has never used smokeless tobacco. She reports that she does not drink alcohol or use illicit drugs.   Family history:   Family History  Problem Relation Age of Onset  . Stroke Neg Hx   . Heart attack Neg Hx   . Heart disease Father   . Cancer Brother      lung    ROS:  Please see the history of present illness.  All other systems reviewed and negative.   PHYSICAL EXAM: VS:  BP 120/70 mmHg  Pulse 109  Ht 5\' 6"  (1.676 m)  Wt 149 lb 14.4 oz (67.994 kg)  BMI 24.21 kg/m2 Well nourished, well developed, in no acute distress HEENT: Pupils are equal round react to light accommodation extraocular movements are intact.  Neck: no JVDNo cervical lymphadenopathy. Cardiac:  Irregularly irregular with elevated rate. No murmurs rubs. Lungs:  clear to auscultation bilaterally, no wheezing, rhonchi or rales Abd: soft, nontender, positive bowel sounds all quadrants,  Ext: no lower extremity edema.  2+ radial pulses. Skin: warm and dry Neuro:  Grossly normal  EKG:   Atrial fibrillation with a rate of 109 bpm   ASSESSMENT AND PLAN:   atrial fibrillation Rate 109 bpm. She is on metoprolol 25 mg twice a day. She just came here from running a bunch of errands as well getting in and out of a car suspect that may be contributing contributing to her elevated heart rate. Denied any dizziness.   I've asked the skilled nursing facility to continue to monitor blood pressure and heart rate.   She is not on anticoagulation candidate   chronic combine systolic and diastolic heart failure  The patient appears euvolemic. She denies any dizziness upon standing. Will continue current dose of Lasix and check a basic metabolic panel at SNF  PSVT   none  Essential hypertension   blood pressure well-controlled.  Coronary artery disease   No complaints of angina It was decided not to pursue coronary angiography during her last hospitalization   Hhe is being followed by Hospice.   DO NOT RESUSCITATE status.

## 2015-09-19 ENCOUNTER — Non-Acute Institutional Stay: Payer: Medicare Other | Admitting: Internal Medicine

## 2015-09-19 ENCOUNTER — Encounter: Payer: Self-pay | Admitting: Pharmacist

## 2015-09-19 DIAGNOSIS — I5043 Acute on chronic combined systolic (congestive) and diastolic (congestive) heart failure: Secondary | ICD-10-CM

## 2015-09-19 DIAGNOSIS — R410 Disorientation, unspecified: Secondary | ICD-10-CM

## 2015-09-19 DIAGNOSIS — R609 Edema, unspecified: Secondary | ICD-10-CM | POA: Diagnosis not present

## 2015-09-19 DIAGNOSIS — I25110 Atherosclerotic heart disease of native coronary artery with unstable angina pectoris: Secondary | ICD-10-CM

## 2015-09-19 NOTE — Progress Notes (Signed)
[name], female   DOB: 02-Sep-1920, 81 y.o.   MRN: [id number]  Location:  [facility] Provider:  Rexene Edison. Mariea Clonts, D.O., C.M.D.  Code Status:  DNR Goals of care: Advanced Directive information Does patient have an advance directive?: Yes, Type of Advance Directive: [place];Living will;Out of facility DNR (pink MOST or yellow form), Pre-existing out of facility DNR order (yellow form or pink MOST form): Pink MOST form placed in chart (order not valid for inpatient use);Yellow form placed in chart (order not valid for inpatient use), Does patient want to make changes to advanced directive?: No - Patient declined  Chief Complaint  Patient presents with  . Acute Visit    increased edema with weeping, worsening shortness of breath    HPI:  81 yo white female with obstructive CAD, CHF, afib and unstable angina seen for increasing weight, edema, shortness of breath.  She is on hospice care for her heart disease.  She has been confused and seen several times walking with her walker looking for her room after sitting outside in the sun.  She has been visibly dyspneic.  She has had some episodes of chest pain which were treated with ntg and roxanol.  She is also on ativan now which she's getting at bedtime and as needed.    When seen, she was very drowsy, resting in her recliner chair, wearing oxygen.  Her caregiver, granddaughter and great granddaughter were present.  She's had increased edema of the legs with weeping from them.  She denies active chest pain.  Review of Systems:  Review of Systems  Constitutional: Negative for fever and chills.  HENT: Negative for congestion and hearing loss.   Eyes: Negative for blurred vision.  Respiratory: Positive for shortness of breath. Negative for cough, sputum production and wheezing.   Cardiovascular: Positive for chest pain and leg swelling. Negative for palpitations and orthopnea.  Gastrointestinal: Negative for  abdominal pain.  Genitourinary: Negative for dysuria, urgency and frequency.  Musculoskeletal: Negative for myalgias and falls.  Skin: Negative for rash.  Neurological: Negative for dizziness, loss of consciousness and headaches.  Endo/Heme/Allergies: Does not bruise/bleed easily.  Psychiatric/Behavioral: Positive for memory loss.    Past Medical History  Diagnosis Date  . SVT (supraventricular tachycardia)     Diagnosed 2010  . HTN (hypertension)   . Dyslipidemia   . CAD (coronary artery disease)     a. NSTEMI 2010 in setting of SVT, with cath with unsuccessful PCI - chronic total occlusion of LAD, R->L collaterals.  . GI bleeding   . Unspecified hypothyroidism   . Unspecified hereditary and idiopathic peripheral neuropathy   . Pneumonia, organism unspecified   . Basal cell carcinoma of skin of trunk, except scrotum   . Unspecified malignant neoplasm of skin of lower limb, including hip   . Basal cell carcinoma of skin of lower limb, including hip   . Unspecified glaucoma     left eye  . Other premature beats   . Other esophagitis   . Esophageal reflux   . Osteoarthrosis, unspecified whether generalized or localized, unspecified site     multiple joints  . Pain in joint, shoulder region   . Pain in joint, lower leg     knee  . Pain in joint, ankle and foot     right  . Sacroiliitis, not elsewhere classified   . Cervicalgia   . Lumbago   . Ganglion of tendon sheath   .  Pathologic fracture of vertebrae   . Tietze's disease   . Insomnia, unspecified   . Disturbance of skin sensation   . Edema   . Palpitations   . Other nonspecific abnormal serum enzyme levels   . Nasal bones, closed fracture   . Closed fracture of unspecified trochanteric section of femur   . Open wound of knee, leg (except thigh), and ankle, without mention of complication   . Hip, thigh, leg, and ankle, abrasion or friction burn, without mention of infection   . Contusion of hip   . Fall from other  slipping, tripping, or stumbling   . Personal history of fall   . Dyspepsia and other specified disorders of function of stomach   . Paroxysmal supraventricular tachycardia   . Acute bronchiolitis due to other infectious organisms 03/02/2013  . Other abnormal blood chemistry 03/15/2013  . Contusion of rib on left side 08/03/2013  . Hypertension   . Coronary artery disease   . Glaucoma     Patient Active Problem List   Diagnosis Date Noted  . Encounter for palliative care   . Pain in the chest   . Atrial fibrillation, unspecified 08/07/2015  . Dislocated IOL (intraocular lens), posterior 04/25/2015  . Memory loss 04/11/2015  . Poor appetite 01/05/2015  . Aortic aneurysm 12/28/2014  . Other emphysema 12/28/2014  . Chronic diastolic heart failure 42/70/6237  . SOB (shortness of breath) 11/01/2014  . Tachycardia-bradycardia syndrome 10/08/2014  . Acute on chronic diastolic heart failure 62/83/1517  . LBBB (left bundle branch block) 10/06/2014  . CAD (coronary artery disease) 10/06/2014  . PSVT (paroxysmal supraventricular tachycardia) 10/06/2014  . Sinus bradycardia 10/06/2014  . Acute CHF 10/06/2014  . Open wound of knee 10/03/2014  . Closed fracture nose 09/12/2014  . HTN (hypertension) 08/03/2014  . Advanced directives, counseling/discussion 05/25/2014  . Palpitations 05/24/2014  . Dyspnea 04/25/2014  . Skin cancer of arm 04/05/2014  . Carotid atherosclerosis 04/05/2014  . Dizzy 04/04/2014  . Nausea alone 04/04/2014  . Syncope 02/14/2014  . Open wound of left heel 02/07/2014  . Fracture of finger of right hand with routine healing 01/10/2014  . Hypotension, unspecified 08/11/2013  . Chest pain 08/11/2013  . Contusion of rib on left side 08/03/2013  . Ganglion of tendon sheath 06/28/2013  . Hyperglycemia 06/28/2013  . Hypothyroidism 06/28/2013  . Nocturia 03/29/2013  . Gait disorder 11/10/2011  . PSVT 11/29/2010  . CORONARY ATHEROSCLEROSIS NATIVE CORONARY ARTERY  07/18/2010  . Hyperlipidemia 07/16/2010  . MI 07/16/2010    No Known Allergies  Medications: Patient's Medications  New Prescriptions   No medications on file  Previous Medications   ACETAMINOPHEN (TYLENOL) 500 MG TABLET    Take 1,000 mg by mouth 2 (two) times daily.    ALUM & MAG HYDROXIDE-SIMETH (MAALOX/MYLANTA) 200-200-20 MG/5ML SUSPENSION    Take 15 mLs by mouth every 4 (four) hours as needed for indigestion or heartburn.   ASPIRIN EC 81 MG TABLET    Take 81 mg by mouth every evening.   ATORVASTATIN (LIPITOR) 20 MG TABLET    TAKE 1 TABLET BY MOUTH DAILY TO LOWER LIPIDS   BRINZOLAMIDE-BRIMONIDINE (SIMBRINZA) 1-0.2 % SUSP    Place 1 drop into both eyes 2 (two) times daily.    EZETIMIBE (ZETIA) 10 MG TABLET    Take 10 mg by mouth daily.   FLUTICASONE (FLONASE) 50 MCG/ACT NASAL SPRAY    Place 1 spray into both nostrils 2 (two) times daily.   FUROSEMIDE (LASIX)  40 MG TABLET    Take 1 tablet (40 mg total) by mouth daily.   HYDROCODONE-ACETAMINOPHEN (NORCO) 5-325 MG PER TABLET    Take 1 tablet by mouth every 6 (six) hours as needed for severe pain.   IMIPRAMINE (TOFRANIL) 50 MG TABLET    Take 50 mg by mouth at bedtime.   ISOSORBIDE MONONITRATE (IMDUR) 30 MG 24 HR TABLET    Take 0.5 tablets (15 mg total) by mouth daily.   LATANOPROST (XALATAN) 0.005 % OPHTHALMIC SOLUTION    Place 1 drop into both eyes every evening. UAD   LEVOTHYROXINE (SYNTHROID, LEVOTHROID) 50 MCG TABLET    TAKE 1 TABLET BY MOUTH ONCE DAILY   LISINOPRIL (PRINIVIL,ZESTRIL) 10 MG TABLET    Take 1 tablet (10 mg total) by mouth 2 (two) times daily.   LORATADINE (CLARITIN) 10 MG TABLET    Take 10 mg by mouth daily.   MELATONIN 5 MG TABS    Take 1 tablet by mouth at bedtime as needed (insomnia).    METOPROLOL TARTRATE (LOPRESSOR) 25 MG TABLET    Take 1 tablet (25 mg total) by mouth 2 (two) times daily.   MULTIPLE VITAMIN (MULTIVITAMIN WITH MINERALS) TABS    Take 1 tablet by mouth daily. Certavite-A   MULTIPLE VITAMINS-MINERALS  (PRESERVISION/LUTEIN) CAPS    Take 1 capsule by mouth 2 (two) times daily.    NITROGLYCERIN (NITROSTAT) 0.4 MG SL TABLET    Place 1 tablet (0.4 mg total) under the tongue every 5 (five) minutes as needed for chest pain.   OMEPRAZOLE (PRILOSEC) 20 MG CAPSULE    Take 40 mg by mouth daily.    POTASSIUM CHLORIDE ER 20 MEQ TBCR    Take 20 mEq by mouth 2 (two) times daily.    TROLAMINE SALICYLATE (ASPERCREME) 10 % CREAM    Apply 1 application topically 3 (three) times daily as needed for muscle pain.   Modified Medications   No medications on file  Discontinued Medications   No medications on file    Physical Exam: Filed Vitals:   09/19/15 0901  BP: 97/70  Pulse: 89  Temp: 97.3 F (36.3 C)  Resp: 22  SpO2: 98%   There is no weight on file to calculate BMI. Wt 157.6 lb (last weight) on 09/15/15 vs. 151-152 range 9/7-9/10/16 Physical Exam  Constitutional:  Thin white female resting in recliner, tachypneic  Cardiovascular: Normal heart sounds and intact distal pulses.   Tachy; weeping from legs with 2+ pitting edema  Pulmonary/Chest: She has rales.  tachypnea  Abdominal: Soft. Bowel sounds are normal.  Neurological:  Sleepy--unable to stay awake for duration of visit; did answer questions appropriately when aroused  Skin: Skin is warm.  Weeping from anterior shins    Labs reviewed: Basic Metabolic Panel:  Recent Labs  04/18/15 1141  08/22/15 1353 08/23/15 0706 08/29/15  NA 139  < > 134* 135 139  K 4.1  < > 4.5 4.0 4.6  CL 106  --  102 105  --   CO2 27  --  26 20*  --   GLUCOSE 126*  --  123* 103*  --   BUN 24*  < > 25* 21* 28*  CREATININE 1.00  < > 1.26* 1.02* 1.2*  CALCIUM 8.7  --  8.9 8.6*  --   < > = values in this interval not displayed.  Liver Function Tests:  Recent Labs  10/06/14 1635 03/23/15  AST 34 17  ALT 29 14  ALKPHOS  137* 157*  BILITOT 0.6  --   PROT 7.7  --   ALBUMIN 3.2*  --     CBC:  Recent Labs  10/06/14 1635  08/22/15 1353  08/23/15 0706 08/24/15 0630 08/25/15 0446  WBC 7.8  < > 12.4* 10.9* 8.2 8.1  NEUTROABS 5.0  --  9.8*  --   --   --   HGB 13.2  < > 14.0 13.3 12.9 12.8  HCT 40.2  < > 43.2 41.4 40.6 40.3  MCV 98.0  < > 102.9* 101.7* 102.0* 102.8*  PLT 232  < > 236 216 203 243  < > = values in this interval not displayed.  Lab Results  Component Value Date   TSH 1.56 03/23/2015   Lab Results  Component Value Date   HGBA1C 5.7 12/21/2013   Lab Results  Component Value Date   CHOL 129 03/24/2014   HDL 29* 03/24/2014   LDLCALC 69 03/24/2014   TRIG 117 03/24/2014   CHOLHDL 7.3 04/28/2009    Patient Care Team: Gayland Curry, DO as PCP - General (Geriatric Medicine) Imogene Burn, PA-C as Physician Assistant (Cardiology) Mardene Celeste, NP as Nurse Practitioner (Geriatric Medicine) [facility] Minus Breeding, MD as Consulting Physician (Cardiology) Estill Dooms, MD (Internal Medicine)  Assessment/Plan 1. Acute on chronic combined systolic and diastolic heart failure -will increase lasix and potassium for 3 days and monitor respiratory status and edema -increase lasix to 40mg  in am and at noon, and kcl 40meq po bid both for the 3 days, then return to lasix 40mg  daily and kcl 9meq po bid -will not put her through labs due to her goals of care -treating with lasix purely for comfort--edema of legs has made it more difficult to walk and it's been difficult to control her dyspnea while allowing her to function well (walk around and go sit outside as she enjoys) -cont as needed roxanol and ativan  2. Coronary artery disease involving native coronary artery of native heart with unstable angina pectoris -cont ntg prn, lopressor, lisinopril, imdur, lipitor, asa 81  3. Edema -will treat with increased lasix and kcl for 3 days  4. Acute delirium -due to roxanol, ativan, periods of hypoxia when she removes her oxygen and possibly some CO2 retention, also -cont to monitor  as she diureses  Family/ staff Communication: discussed with pt, granddaughter, caregiver, [place] nursing  Labs/tests ordered:  No labs due to hospice care goals  Benjiman Sedgwick L. Lior Hoen, D.O. [facility] [address], [place] [address] Cell Phone (Mon-Fri 8am-5pm):  [phone] On Call:  [phone] & follow prompts after 5pm & weekends Office Phone:  [phone] Office Fax:  [phone]

## 2015-09-20 ENCOUNTER — Encounter: Payer: Self-pay | Admitting: Internal Medicine

## 2015-09-26 ENCOUNTER — Telehealth: Payer: Self-pay | Admitting: Cardiology

## 2015-09-30 DEATH — deceased

## 2015-10-04 NOTE — Telephone Encounter (Signed)
Closed encounter °

## 2015-10-31 ENCOUNTER — Ambulatory Visit: Payer: Self-pay | Admitting: Cardiology

## 2015-10-31 DEATH — deceased

## 2015-11-02 ENCOUNTER — Ambulatory Visit: Payer: Self-pay | Admitting: Cardiology

## 2015-11-15 ENCOUNTER — Encounter: Payer: Self-pay | Admitting: Internal Medicine
